# Patient Record
Sex: Female | Born: 1940 | Race: White | Hispanic: No | Marital: Married | Smoking: Never smoker
Health system: Southern US, Community
[De-identification: ages and names within clinical notes are randomized; demographics above are authoritative.]

## PROBLEM LIST (undated history)

## (undated) DIAGNOSIS — K219 Gastro-esophageal reflux disease without esophagitis: Secondary | ICD-10-CM

## (undated) DIAGNOSIS — M549 Dorsalgia, unspecified: Secondary | ICD-10-CM

## (undated) DIAGNOSIS — G629 Polyneuropathy, unspecified: Secondary | ICD-10-CM

## (undated) DIAGNOSIS — D649 Anemia, unspecified: Secondary | ICD-10-CM

## (undated) DIAGNOSIS — I10 Essential (primary) hypertension: Secondary | ICD-10-CM

## (undated) DIAGNOSIS — K602 Anal fissure, unspecified: Secondary | ICD-10-CM

## (undated) DIAGNOSIS — J329 Chronic sinusitis, unspecified: Secondary | ICD-10-CM

## (undated) DIAGNOSIS — M1990 Unspecified osteoarthritis, unspecified site: Secondary | ICD-10-CM

## (undated) DIAGNOSIS — E039 Hypothyroidism, unspecified: Secondary | ICD-10-CM

## (undated) DIAGNOSIS — T7840XA Allergy, unspecified, initial encounter: Secondary | ICD-10-CM

## (undated) DIAGNOSIS — D369 Benign neoplasm, unspecified site: Secondary | ICD-10-CM

## (undated) DIAGNOSIS — E7800 Pure hypercholesterolemia, unspecified: Secondary | ICD-10-CM

## (undated) DIAGNOSIS — C801 Malignant (primary) neoplasm, unspecified: Secondary | ICD-10-CM

## (undated) DIAGNOSIS — R42 Dizziness and giddiness: Secondary | ICD-10-CM

## (undated) DIAGNOSIS — D3610 Benign neoplasm of peripheral nerves and autonomic nervous system, unspecified: Secondary | ICD-10-CM

## (undated) DIAGNOSIS — H269 Unspecified cataract: Secondary | ICD-10-CM

## (undated) DIAGNOSIS — E079 Disorder of thyroid, unspecified: Secondary | ICD-10-CM

## (undated) HISTORY — DX: Anal fissure, unspecified: K60.2

## (undated) HISTORY — DX: Anemia, unspecified: D64.9

## (undated) HISTORY — DX: Disorder of thyroid, unspecified: E07.9

## (undated) HISTORY — DX: Gastro-esophageal reflux disease without esophagitis: K21.9

## (undated) HISTORY — PX: COLONOSCOPY: SHX174

## (undated) HISTORY — DX: Unspecified osteoarthritis, unspecified site: M19.90

## (undated) HISTORY — PX: EYE SURGERY: SHX253

## (undated) HISTORY — PX: LIPOMA EXCISION: SHX5283

## (undated) HISTORY — PX: TOTAL ABDOMINAL HYSTERECTOMY W/ BILATERAL SALPINGOOPHORECTOMY: SHX83

## (undated) HISTORY — PX: RECTOCELE REPAIR: SHX761

## (undated) HISTORY — PX: DILATION AND CURETTAGE OF UTERUS: SHX78

## (undated) HISTORY — DX: Unspecified cataract: H26.9

## (undated) HISTORY — DX: Malignant (primary) neoplasm, unspecified: C80.1

## (undated) HISTORY — DX: Allergy, unspecified, initial encounter: T78.40XA

## (undated) HISTORY — PX: HEMORROIDECTOMY: SUR656

## (undated) HISTORY — DX: Essential (primary) hypertension: I10

## (undated) HISTORY — PX: TOOTH EXTRACTION: SUR596

## (undated) HISTORY — PX: BREAST BIOPSY: SHX20

## (undated) HISTORY — PX: TUBAL LIGATION: SHX77

## (undated) HISTORY — PX: ABDOMINAL HYSTERECTOMY: SHX81

---

## 1978-05-28 HISTORY — PX: BREAST EXCISIONAL BIOPSY: SUR124

## 2005-11-26 ENCOUNTER — Encounter: Payer: Self-pay | Admitting: Family Medicine

## 2006-10-02 ENCOUNTER — Emergency Department (HOSPITAL_COMMUNITY): Admission: EM | Admit: 2006-10-02 | Discharge: 2006-10-02 | Payer: Self-pay | Admitting: Family Medicine

## 2007-01-02 ENCOUNTER — Ambulatory Visit: Payer: Self-pay | Admitting: Obstetrics & Gynecology

## 2007-01-10 ENCOUNTER — Encounter: Admission: RE | Admit: 2007-01-10 | Discharge: 2007-01-10 | Payer: Self-pay | Admitting: Obstetrics & Gynecology

## 2007-02-05 ENCOUNTER — Ambulatory Visit: Payer: Self-pay | Admitting: Family Medicine

## 2007-02-05 DIAGNOSIS — M1990 Unspecified osteoarthritis, unspecified site: Secondary | ICD-10-CM | POA: Insufficient documentation

## 2007-02-05 DIAGNOSIS — J309 Allergic rhinitis, unspecified: Secondary | ICD-10-CM | POA: Insufficient documentation

## 2007-02-05 DIAGNOSIS — D509 Iron deficiency anemia, unspecified: Secondary | ICD-10-CM | POA: Insufficient documentation

## 2007-02-05 DIAGNOSIS — M169 Osteoarthritis of hip, unspecified: Secondary | ICD-10-CM | POA: Insufficient documentation

## 2007-02-05 DIAGNOSIS — G43009 Migraine without aura, not intractable, without status migrainosus: Secondary | ICD-10-CM | POA: Insufficient documentation

## 2007-02-06 ENCOUNTER — Encounter: Payer: Self-pay | Admitting: Family Medicine

## 2007-02-11 ENCOUNTER — Encounter (INDEPENDENT_AMBULATORY_CARE_PROVIDER_SITE_OTHER): Payer: Self-pay | Admitting: *Deleted

## 2007-02-12 ENCOUNTER — Ambulatory Visit: Payer: Self-pay | Admitting: Family Medicine

## 2007-02-12 DIAGNOSIS — E669 Obesity, unspecified: Secondary | ICD-10-CM | POA: Insufficient documentation

## 2007-02-14 LAB — CONVERTED CEMR LAB
AST: 17 units/L (ref 0–37)
Albumin: 3.9 g/dL (ref 3.5–5.2)
Chloride: 111 meq/L (ref 96–112)
Cholesterol: 259 mg/dL (ref 0–200)
Direct LDL: 166 mg/dL
GFR calc non Af Amer: 89 mL/min
Sodium: 145 meq/L (ref 135–145)
TSH: 5.11 microintl units/mL (ref 0.35–5.50)
Total CHOL/HDL Ratio: 5.6
VLDL: 55 mg/dL — ABNORMAL HIGH (ref 0–40)

## 2007-02-21 ENCOUNTER — Telehealth: Payer: Self-pay | Admitting: Family Medicine

## 2007-02-21 ENCOUNTER — Ambulatory Visit: Payer: Self-pay | Admitting: Family Medicine

## 2007-02-21 DIAGNOSIS — E039 Hypothyroidism, unspecified: Secondary | ICD-10-CM | POA: Insufficient documentation

## 2007-02-21 LAB — CONVERTED CEMR LAB: T3, Free: 2.7 pg/mL (ref 2.3–4.2)

## 2007-03-05 ENCOUNTER — Ambulatory Visit: Payer: Self-pay | Admitting: Family Medicine

## 2007-03-05 DIAGNOSIS — I10 Essential (primary) hypertension: Secondary | ICD-10-CM | POA: Insufficient documentation

## 2007-03-06 ENCOUNTER — Encounter: Payer: Self-pay | Admitting: Family Medicine

## 2007-04-17 ENCOUNTER — Encounter: Admission: RE | Admit: 2007-04-17 | Discharge: 2007-04-17 | Payer: Self-pay | Admitting: Orthopedic Surgery

## 2007-05-29 ENCOUNTER — Telehealth: Payer: Self-pay | Admitting: Family Medicine

## 2007-05-29 LAB — HM COLONOSCOPY

## 2007-06-02 ENCOUNTER — Ambulatory Visit: Payer: Self-pay | Admitting: Family Medicine

## 2007-06-06 LAB — CONVERTED CEMR LAB
Cholesterol: 212 mg/dL (ref 0–200)
Total CHOL/HDL Ratio: 3.9

## 2007-08-04 ENCOUNTER — Ambulatory Visit: Payer: Self-pay | Admitting: Family Medicine

## 2007-08-08 LAB — CONVERTED CEMR LAB
T3, Free: 2.7 pg/mL (ref 2.3–4.2)
TSH: 3.32 microintl units/mL (ref 0.35–5.50)

## 2007-09-04 ENCOUNTER — Ambulatory Visit: Payer: Self-pay | Admitting: Family Medicine

## 2007-09-15 LAB — CONVERTED CEMR LAB
Cholesterol: 210 mg/dL (ref 0–200)
HDL: 51.3 mg/dL (ref >39.0)
Triglycerides: 136 mg/dL (ref 0–149)

## 2007-09-22 ENCOUNTER — Ambulatory Visit: Payer: Self-pay | Admitting: Family Medicine

## 2007-09-29 ENCOUNTER — Encounter: Payer: Self-pay | Admitting: Family Medicine

## 2007-10-15 ENCOUNTER — Telehealth: Payer: Self-pay | Admitting: Family Medicine

## 2007-10-27 ENCOUNTER — Encounter: Payer: Self-pay | Admitting: Family Medicine

## 2007-10-28 ENCOUNTER — Telehealth: Payer: Self-pay | Admitting: Family Medicine

## 2007-11-07 ENCOUNTER — Encounter: Payer: Self-pay | Admitting: Family Medicine

## 2007-12-11 ENCOUNTER — Ambulatory Visit: Payer: Self-pay | Admitting: Family Medicine

## 2007-12-12 LAB — CONVERTED CEMR LAB
ALT: 14 units/L (ref 0–35)
AST: 16 units/L (ref 0–37)
LDL Cholesterol: 116 mg/dL — ABNORMAL HIGH (ref 0–99)
Total CHOL/HDL Ratio: 3.9
VLDL: 31 mg/dL (ref 0–40)

## 2008-01-21 ENCOUNTER — Ambulatory Visit: Payer: Self-pay | Admitting: Family Medicine

## 2008-02-09 ENCOUNTER — Ambulatory Visit: Payer: Self-pay | Admitting: Family Medicine

## 2008-02-11 ENCOUNTER — Encounter: Admission: RE | Admit: 2008-02-11 | Discharge: 2008-02-11 | Payer: Self-pay | Admitting: Family Medicine

## 2008-02-12 ENCOUNTER — Encounter (INDEPENDENT_AMBULATORY_CARE_PROVIDER_SITE_OTHER): Payer: Self-pay | Admitting: *Deleted

## 2008-02-13 ENCOUNTER — Encounter: Payer: Self-pay | Admitting: Family Medicine

## 2008-02-13 ENCOUNTER — Encounter: Admission: RE | Admit: 2008-02-13 | Discharge: 2008-02-13 | Payer: Self-pay | Admitting: Family Medicine

## 2008-02-26 ENCOUNTER — Ambulatory Visit: Payer: Self-pay | Admitting: Internal Medicine

## 2008-03-09 ENCOUNTER — Ambulatory Visit: Payer: Self-pay | Admitting: Family Medicine

## 2008-03-12 ENCOUNTER — Ambulatory Visit: Payer: Self-pay | Admitting: Internal Medicine

## 2008-05-26 ENCOUNTER — Telehealth: Payer: Self-pay | Admitting: Family Medicine

## 2008-05-28 HISTORY — PX: RECTAL POLYPECTOMY: SHX2309

## 2008-05-31 ENCOUNTER — Encounter: Payer: Self-pay | Admitting: Family Medicine

## 2008-06-03 ENCOUNTER — Encounter (INDEPENDENT_AMBULATORY_CARE_PROVIDER_SITE_OTHER): Payer: Self-pay | Admitting: Surgery

## 2008-06-03 ENCOUNTER — Ambulatory Visit (HOSPITAL_COMMUNITY): Admission: RE | Admit: 2008-06-03 | Discharge: 2008-06-04 | Payer: Self-pay | Admitting: Urology

## 2008-06-18 ENCOUNTER — Ambulatory Visit: Payer: Self-pay | Admitting: Family Medicine

## 2008-06-22 ENCOUNTER — Ambulatory Visit: Payer: Self-pay | Admitting: Family Medicine

## 2008-06-22 LAB — CONVERTED CEMR LAB
AST: 16 units/L (ref 0–37)
BUN: 17 mg/dL (ref 6–23)
Basophils Absolute: 0.1 10*3/uL (ref 0.0–0.1)
Basophils Relative: 0.9 % (ref 0.0–3.0)
CO2: 27 meq/L (ref 19–32)
Calcium: 9.3 mg/dL (ref 8.4–10.5)
Cholesterol, target level: 200 mg/dL
Creatinine, Ser: 0.8 mg/dL (ref 0.4–1.2)
Eosinophils Absolute: 0.4 10*3/uL (ref 0.0–0.7)
Eosinophils Relative: 4.7 % (ref 0.0–5.0)
Free T4: 0.9 ng/dL (ref 0.6–1.6)
HDL goal, serum: 40 mg/dL
Hemoglobin: 14.3 g/dL (ref 12.0–15.0)
MCHC: 34.5 g/dL (ref 30.0–36.0)
MCV: 89.1 fL (ref 78.0–100.0)
Neutro Abs: 4.2 10*3/uL (ref 1.4–7.7)
RBC: 4.63 M/uL (ref 3.87–5.11)
T3, Free: 2.2 pg/mL — ABNORMAL LOW (ref 2.3–4.2)

## 2008-07-26 ENCOUNTER — Ambulatory Visit: Payer: Self-pay | Admitting: Family Medicine

## 2008-09-27 ENCOUNTER — Ambulatory Visit: Payer: Self-pay | Admitting: Family Medicine

## 2008-09-27 LAB — CONVERTED CEMR LAB
ALT: 14 units/L (ref 0–35)
AST: 16 units/L (ref 0–37)
Alkaline Phosphatase: 48 units/L (ref 39–117)
Bilirubin, Direct: 0.1 mg/dL (ref 0.0–0.3)
CO2: 29 meq/L (ref 19–32)
Chloride: 108 meq/L (ref 96–112)
Creatinine, Ser: 0.7 mg/dL (ref 0.4–1.2)
LDL Cholesterol: 97 mg/dL (ref 0–99)
Potassium: 3.7 meq/L (ref 3.5–5.1)
Sodium: 142 meq/L (ref 135–145)
Total Bilirubin: 1 mg/dL (ref 0.3–1.2)
Total CHOL/HDL Ratio: 3
Total Protein: 6.5 g/dL (ref 6.0–8.3)
Triglycerides: 134 mg/dL (ref 0.0–149.0)

## 2008-09-28 ENCOUNTER — Telehealth: Payer: Self-pay | Admitting: Family Medicine

## 2008-09-28 ENCOUNTER — Ambulatory Visit: Payer: Self-pay | Admitting: Family Medicine

## 2008-09-28 LAB — CONVERTED CEMR LAB
Blood in Urine, dipstick: NEGATIVE
Nitrite: NEGATIVE
Urobilinogen, UA: 0.2
pH: 6

## 2008-09-29 ENCOUNTER — Encounter: Payer: Self-pay | Admitting: Family Medicine

## 2008-11-12 ENCOUNTER — Ambulatory Visit: Payer: Self-pay | Admitting: Family Medicine

## 2008-11-12 DIAGNOSIS — G609 Hereditary and idiopathic neuropathy, unspecified: Secondary | ICD-10-CM | POA: Insufficient documentation

## 2008-11-12 LAB — CONVERTED CEMR LAB
Basophils Relative: 0.8 % (ref 0.0–3.0)
Eosinophils Relative: 2.1 % (ref 0.0–5.0)
HCT: 40.3 % (ref 36.0–46.0)
Lymphs Abs: 1.5 10*3/uL (ref 0.7–4.0)
MCV: 88.7 fL (ref 78.0–100.0)
Monocytes Absolute: 0.5 10*3/uL (ref 0.1–1.0)
Monocytes Relative: 7.9 % (ref 3.0–12.0)
Platelets: 209 10*3/uL (ref 150.0–400.0)
RBC: 4.55 M/uL (ref 3.87–5.11)
TSH: 1.58 microintl units/mL (ref 0.35–5.50)
Vitamin B-12: 298 pg/mL (ref 211–911)
WBC: 6.1 10*3/uL (ref 4.5–10.5)

## 2008-11-16 ENCOUNTER — Encounter: Payer: Self-pay | Admitting: Family Medicine

## 2008-12-27 ENCOUNTER — Ambulatory Visit: Payer: Self-pay | Admitting: Family Medicine

## 2009-01-26 ENCOUNTER — Ambulatory Visit: Payer: Self-pay | Admitting: Family Medicine

## 2009-01-26 DIAGNOSIS — K5909 Other constipation: Secondary | ICD-10-CM | POA: Insufficient documentation

## 2009-01-26 LAB — CONVERTED CEMR LAB
Glucose, Urine, Semiquant: NEGATIVE
Ketones, urine, test strip: NEGATIVE
Protein, U semiquant: 100
Specific Gravity, Urine: 1.01
pH: 7.5

## 2009-02-07 ENCOUNTER — Ambulatory Visit: Payer: Self-pay | Admitting: Family Medicine

## 2009-02-08 ENCOUNTER — Encounter (INDEPENDENT_AMBULATORY_CARE_PROVIDER_SITE_OTHER): Payer: Self-pay | Admitting: Internal Medicine

## 2009-02-14 ENCOUNTER — Encounter: Admission: RE | Admit: 2009-02-14 | Discharge: 2009-02-14 | Payer: Self-pay | Admitting: Family Medicine

## 2009-03-17 ENCOUNTER — Ambulatory Visit: Payer: Self-pay | Admitting: Family Medicine

## 2009-03-17 DIAGNOSIS — E7800 Pure hypercholesterolemia, unspecified: Secondary | ICD-10-CM | POA: Insufficient documentation

## 2009-03-18 LAB — CONVERTED CEMR LAB
Albumin: 3.9 g/dL (ref 3.5–5.2)
BUN: 17 mg/dL (ref 6–23)
CO2: 29 meq/L (ref 19–32)
Calcium: 8.9 mg/dL (ref 8.4–10.5)
Cholesterol: 184 mg/dL (ref 0–200)
Creatinine, Ser: 0.6 mg/dL (ref 0.4–1.2)
Direct LDL: 110.3 mg/dL
GFR calc non Af Amer: 105.3 mL/min (ref >60)
Glucose, Bld: 88 mg/dL (ref 70–99)
HDL: 47.9 mg/dL (ref >39.00)
Total Protein: 6.1 g/dL (ref 6.0–8.3)
Triglycerides: 226 mg/dL — ABNORMAL HIGH (ref 0.0–149.0)
VLDL: 45.2 mg/dL — ABNORMAL HIGH (ref 0.0–40.0)

## 2009-03-24 ENCOUNTER — Ambulatory Visit: Payer: Self-pay | Admitting: Family Medicine

## 2009-03-24 LAB — CONVERTED CEMR LAB
Ketones, urine, test strip: NEGATIVE
Nitrite: NEGATIVE
Protein, U semiquant: NEGATIVE
Specific Gravity, Urine: >=1.03
Urobilinogen, UA: 0.2

## 2009-03-25 ENCOUNTER — Encounter: Payer: Self-pay | Admitting: Family Medicine

## 2009-04-07 ENCOUNTER — Ambulatory Visit: Payer: Self-pay | Admitting: Family Medicine

## 2009-04-07 LAB — CONVERTED CEMR LAB
Glucose, Urine, Semiquant: NEGATIVE
Ketones, urine, test strip: NEGATIVE
Nitrite: NEGATIVE
Specific Gravity, Urine: 1.01
pH: 6

## 2009-05-26 ENCOUNTER — Ambulatory Visit: Payer: Self-pay | Admitting: Family Medicine

## 2009-05-28 HISTORY — PX: HIP SURGERY: SHX245

## 2009-07-11 ENCOUNTER — Inpatient Hospital Stay (HOSPITAL_COMMUNITY): Admission: RE | Admit: 2009-07-11 | Discharge: 2009-07-14 | Payer: Self-pay | Admitting: Orthopedic Surgery

## 2009-09-06 ENCOUNTER — Ambulatory Visit: Payer: Self-pay | Admitting: Family Medicine

## 2009-09-06 LAB — CONVERTED CEMR LAB
Alkaline Phosphatase: 61 units/L (ref 39–117)
Bilirubin, Direct: 0.1 mg/dL (ref 0.0–0.3)
CO2: 30 meq/L (ref 19–32)
Calcium: 9 mg/dL (ref 8.4–10.5)
GFR calc non Af Amer: 75.45 mL/min (ref >60)
HDL: 58 mg/dL (ref >39.00)
Sodium: 144 meq/L (ref 135–145)
Total CHOL/HDL Ratio: 3
VLDL: 29.4 mg/dL (ref 0.0–40.0)

## 2009-09-13 ENCOUNTER — Ambulatory Visit: Payer: Self-pay | Admitting: Family Medicine

## 2009-09-13 DIAGNOSIS — N895 Stricture and atresia of vagina: Secondary | ICD-10-CM | POA: Insufficient documentation

## 2009-09-29 ENCOUNTER — Encounter: Payer: Self-pay | Admitting: Family Medicine

## 2009-11-11 ENCOUNTER — Telehealth: Payer: Self-pay | Admitting: Family Medicine

## 2009-12-13 ENCOUNTER — Telehealth: Payer: Self-pay | Admitting: Family Medicine

## 2010-03-13 ENCOUNTER — Encounter: Admission: RE | Admit: 2010-03-13 | Discharge: 2010-03-13 | Payer: Self-pay | Admitting: Family Medicine

## 2010-04-03 ENCOUNTER — Ambulatory Visit: Payer: Self-pay | Admitting: Family Medicine

## 2010-04-03 ENCOUNTER — Telehealth (INDEPENDENT_AMBULATORY_CARE_PROVIDER_SITE_OTHER): Payer: Self-pay | Admitting: *Deleted

## 2010-04-03 LAB — CONVERTED CEMR LAB
Albumin: 3.7 g/dL (ref 3.5–5.2)
Alkaline Phosphatase: 51 units/L (ref 39–117)
Bilirubin, Direct: 0.1 mg/dL (ref 0.0–0.3)
Calcium: 9.1 mg/dL (ref 8.4–10.5)
GFR calc non Af Amer: 90.86 mL/min (ref >60)
HDL: 59.6 mg/dL (ref >39.00)
LDL Cholesterol: 84 mg/dL (ref 0–99)
Sodium: 142 meq/L (ref 135–145)
Total CHOL/HDL Ratio: 3
VLDL: 38.6 mg/dL (ref 0.0–40.0)

## 2010-04-07 ENCOUNTER — Ambulatory Visit: Payer: Self-pay | Admitting: Family Medicine

## 2010-06-17 ENCOUNTER — Other Ambulatory Visit: Payer: Self-pay | Admitting: Family Medicine

## 2010-06-17 DIAGNOSIS — Z780 Asymptomatic menopausal state: Secondary | ICD-10-CM

## 2010-06-28 NOTE — Progress Notes (Signed)
Summary: ABX for dental appointment  Phone Note Call from Patient Call back at Cell:  [phone]   Caller: Patient Call For: Kerby Nora MD Summary of Call: The patient had hip replacement surgery recently and has now been told that she needs ABX prior to dental procedures.  She has an appointment to see her dentist on Monday and needs a Rx. sent to the pharmacy in order to keep this appointment.  She is in hopes that she does not have to reschedule because it takes a long time to get back in for another appointment.  [facility] [place] Initial call taken by: Delilah Shan CMA Duncan Dull),  November 11, 2009 11:04 AM  Follow-up for Phone Call        patient advised rx sent in.Consuello Masse CMA   Follow-up by: Benny Lennert CMA Duncan Dull),  November 11, 2009 11:14 AM    New/Updated Medications: AMOXICILLIN 500 MG CAPS (AMOXICILLIN) 1 tab by mouth two times a day prior to dental appt. Prescriptions: AMOXICILLIN 500 MG CAPS (AMOXICILLIN) 1 tab by mouth two times a day prior to dental appt.  #20 x 0   Entered and Authorized by:   Kerby Nora MD   Signed by:   Kerby Nora MD on 11/11/2009   Method used:   Electronically to        [facility]  [place] (retail)       [address]       [place], [place]  [address]       Ph: [phone] or [phone]       Fax: [phone]   RxID:   [id number]

## 2010-06-28 NOTE — Progress Notes (Signed)
----   Converted from flag ---- ---- 03/31/2010 1:53 PM, Kerby Nora MD wrote: Dx 272.0 CMET, lipiDs, TSH Dx 244.9  ---- 03/30/2010 7:30 AM, Liane Comber CMA (AAMA) wrote: Lab orders please! Good Morning! This pt is scheduled for cpx labs Monday, which labs to draw and dx codes to use? Thanks Tasha ------------------------------

## 2010-06-28 NOTE — Progress Notes (Signed)
Summary: singulair and sulindac  Phone Note Refill Request Call back at Home Phone [phone] Call back at [phone] Message from:  Patient on December 13, 2009 10:29 AM  Refills Requested: Medication #1:  SINGULAIR 10 MG  TABS 1 by mouth daily  Medication #2:  SULINDAC 150 MG  TABS 1 by mouth two times a day Patient is asking for a written script to send to her new mail order pharmacy. Please call patient when ready.    Method Requested: Pick up at Office Initial call taken by: Melody Comas,  December 13, 2009 10:30 AM  Follow-up for Phone Call        Patient advised.Consuello Masse CMA   Follow-up by: Benny Lennert CMA Duncan Dull),  December 13, 2009 11:30 AM    Prescriptions: SULINDAC 150 MG  TABS (SULINDAC) 1 by mouth two times a day  #180 Tablet x 0   Entered and Authorized by:   Kerby Nora MD   Signed by:   Kerby Nora MD on 12/13/2009   Method used:   Print then Give to Patient   RxID:   [id number] SINGULAIR 10 MG  TABS (MONTELUKAST SODIUM) 1 by mouth daily  #90 x 3   Entered and Authorized by:   Kerby Nora MD   Signed by:   Kerby Nora MD on 12/13/2009   Method used:   Print then Give to Patient   RxID:   [id number]

## 2010-06-28 NOTE — Assessment & Plan Note (Signed)
Summary: ROA 30 MIN... CYD R/S FROM 09/13/09   Vital Signs:  Patient profile:   70 year old female Height:      66.75 inches Weight:      220 pounds BMI:     34.84 Temp:     97.9 degrees F oral Pulse rate:   64 / minute Pulse rhythm:   regular BP sitting:   122 / 62  (left arm) Cuff size:   regular  Vitals Entered By: Benny Lennert CMA Duncan Dull) (September 13, 2009 9:37 AM)  History of Present Illness: Chief complaint follow up   Decreased hearing in right ear x 10 days. No pain, no URI symptoms. allergies well controlled.  Recent left hip replacement. Using muscle relaxant and oxycodone.  Has seen urologist in past for rectocele/cystocele repair. S/P hysterectomy.Marland Kitchen  Has noted vaginal opening closing per pt, getting smaller per pt. Painful sex. Only mild  dry vaginal tissue. 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 On premarin daly.  Wants referral to podiatrist.. pain in B feet and legs. Wlaking slowly with cane given hip surgery. Recent PT after hip surgery.  Using sulindac rarely..minimal help. Has had issue with peripheral neuropathy in past... improved signigficantly with B12 supplementation.    Lipid Management History:      Positive NCEP/ATP III risk factors include female age 59 years old or older and hypertension.  Negative NCEP/ATP III risk factors include non-tobacco-user status.        Adjunctive measures started by the patient include aerobic exercise, fiber, limit alcohol consumpton, and weight reduction.  She expresses no side effects from her lipid-lowering medication.  The patient denies any symptoms to suggest myopathy or liver disease.  Comments: Improved control of triglycerides. .   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) 2)  Foot Pain, Bilateral  (ICD-729.5) 3)  Preoperative Examination  (ICD-V72.84) 4)  Microscopic Hematuria  (ICD-599.72) 5)  Pure Hypercholesterolemia  (ICD-272.0) 6)  Constipation   (ICD-564.00) 7)  Uti  (ICD-599.0) 8)  Skin Lesion  (ICD-709.9) 9)  Peripheral Neuropathy  (ICD-356.9) 10)  Dysuria  (ICD-788.1) 11)  Special Screening For Osteoporosis  (ICD-V82.81) 12)  Other Screening Mammogram  (ICD-V76.12) 13)  Urinary Urgency  (ICD-788.63) 14)  Rectal Mass  (ICD-787.99) 15)  Back Pain, Lumbar  (ICD-724.2) 16)  Essential Hypertension, Benign  (ICD-401.1) 17)  Hip Pain, Left  (ICD-719.45) 18)  Hypothyroidism  (ICD-244.9) 19)  Obesity Nos  (ICD-278.00) 20)  Family History of Cad Female 1st Degree Relative <50  (ICD-V17.3) 21)  Common Migraine  (ICD-346.10) 22)  Hyperlipidemia  (ICD-272.4) 23)  Allergic Rhinitis  (ICD-477.9) 24)  Osteoarthritis  (ICD-715.90) 25)  Anemia-iron Deficiency  (ICD-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 1 By Mouth Once Daily 2)  Singulair 10 Mg  Tabs (Montelukast Sodium) .Marland Kitchen.. 1 By Mouth Daily 3)  Allegra 180 Mg  Tabs (Fexofenadine Hcl) .... Take 1 Tablet By Mouth Once A Day 4)  Sulindac 150 Mg  Tabs (Sulindac) .Marland Kitchen.. 1 By Mouth Two Times A Day 5)  Fish Oil   Oil (Fish Oil) .... Takes 2 Capsules Two Times A Day 6)  Simvastatin 40 Mg  Tabs (Simvastatin) .... Take 1 Tablet By Mouth Once A Day 7)  Tens Unit .... Apply As Directed Dx 724.2 8)  Hydrochlorothiazide 25 Mg Tabs (Hydrochlorothiazide) .Marland Kitchen.. 1 Tab By Mouth Daily 9)  Levothyroxine Sodium 50 Mcg Tabs (Levothyroxine  Sodium) .... Take 1 Tablet By Mouth Once A Day 10)  Vitamin B-12 500 Mcg  Tabs (Cyanocobalamin) .... 1,000 Micrograms Once Daily  Allergies: 1)  ! Demerol  Past History:  Past medical, surgical, family and social histories (including risk factors) reviewed, and no changes noted (except as noted below).  Past Medical History: Reviewed history from 02/05/2007 and no changes required. Anemia-iron deficiency Osteoarthritis, neck, right shoulder, hips Allergic rhinitis Hyperlipidemia  Past Surgical  History: Reviewed history from 06/22/2008 and no changes required. hemmrhoidectomy left hip x ray moderate to severe arthritis total hysterectomy, rectocele and cystocele repair 1996 breast biopsy 1980, 2006 lipoma removal 1998 anal polyp removal, rectocele repair 05/2008  Family History: Reviewed history from 09/22/2007 and no changes required. father died 49 kidney disease Bright's disease mother died age 66 CAD, MI, high cholesterol, alcohol/valium PAD Family History of CAD Female 1st degree relative <50 no cancer  Social History: Reviewed history from 06/22/2008 and no changes required. Occupation:  [employer] Married Never Smoked Alcohol use-no Regular exercise-no, occ walk and golf diet:  healthy  Physical Exam  General:  obese appearing female in NAD Ears:  cerumen in B ears , right greater than left....TMs clear after irrigation Nose:  External nasal examination shows no deformity or inflammation. Nasal mucosa are pink and moist without lesions or exudates. Mouth:  good dentition and pharynx pink and moist.  oropharynx clear Neck:  no carotid bruit or thyromegaly no cervical or supraclavicular lymphadenopathy   Lungs:  Normal respiratory effort, chest expands symmetrically. Lungs are clear to auscultation, no crackles or wheezes. Heart:  2/6 sys murmur..chronic per pt  Abdomen:  Bowel sounds positive,abdomen soft and non-tender without masses, organomegaly or hernias noted. Genitalia:  normal introitus, no vaginal discharge, and mucosa pink and moist.   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 Already on estrogen and estrogen cream.  Orders: Gynecologic Referral (Gyn)  Problem # 2:  FOOT PAIN, BILATERAL (ICD-729.5) Likely triggered by change in  gait since hip replacement.  Using NSAID as needed. Refer to foot specialist.  Orders: Podiatry Referral (Podiatry)  Problem # 3:  PURE HYPERCHOLESTEROLEMIA (ICD-272.0)  Her updated medication list for this problem includes:    Simvastatin 40 Mg Tabs (Simvastatin) .Marland Kitchen... Take 1 tablet by mouth once a day Well controlled. Continue current medication.  Labs Reviewed: SGOT: 15 (09/06/2009)   SGPT: 15 (09/06/2009)  Lipid Goals: Chol Goal: 200 (06/22/2008)   HDL Goal: 40 (06/22/2008)   LDL Goal: 130 (06/22/2008)   TG Goal: 150 (06/22/2008)  Prior 10 Yr Risk Heart Disease: 7 % (06/22/2008)   HDL:58.00 (09/06/2009), 47.90 (03/17/2009)  LDL:65 (09/06/2009), 97 (09/27/2008)  Chol:152 (09/06/2009), 184 (03/17/2009)  Trig:147.0 (09/06/2009), 226.0 (03/17/2009)  Problem # 4:  EAR PAIN, RIGHT (ICD-388.70) Due to cerumne impaction..resolved s/p irrigation. No sign of infeciton.  The following medications were removed from the medication list:    Sulfamethoxazole-tmp Ds 800-160 Mg Tabs (Sulfamethoxazole-trimethoprim) .Marland Kitchen... 1 tab by mouth two times a day x 7 days  Problem # 5:  ESSENTIAL HYPERTENSION, BENIGN (ICD-401.1) Well controlled. Continue current medication.  Her updated medication list for this problem includes:    Hydrochlorothiazide 25 Mg Tabs (Hydrochlorothiazide) .Marland Kitchen... 1 tab by mouth daily  BP today: 122/62 Prior BP: 130/72 (  05/26/2009)  Prior 10 Yr Risk Heart Disease: 7 % (06/22/2008)  Labs Reviewed: K+: 4.0 (09/06/2009) Creat: : 0.8 (09/06/2009)   Chol: 152 (09/06/2009)   HDL: 58.00 (09/06/2009)   LDL: 65 (09/06/2009)   TG: 147.0 (09/06/2009)  Complete Medication List: 1)  Premarin 0.625 Mg Tabs (Estrogens conjugated) .Marland Kitchen.. 1 by mouth once daily 2)  Singulair 10 Mg Tabs (Montelukast sodium) .Marland Kitchen.. 1 by mouth daily 3)  Allegra 180 Mg Tabs (Fexofenadine hcl) .... Take 1 tablet by mouth once a day 4)  Sulindac 150 Mg Tabs (Sulindac) .Marland Kitchen.. 1 by mouth two times a day 5)  Fish Oil Oil  (Fish oil) .... Takes 2 capsules two times a day 6)  Simvastatin 40 Mg Tabs (Simvastatin) .... Take 1 tablet by mouth once a day 7)  Tens Unit  .... Apply as directed dx 724.2 8)  Hydrochlorothiazide 25 Mg Tabs (Hydrochlorothiazide) .Marland Kitchen.. 1 tab by mouth daily 9)  Levothyroxine Sodium 50 Mcg Tabs (Levothyroxine sodium) .... Take 1 tablet by mouth once a day 10)  Vitamin B-12 500 Mcg Tabs (Cyanocobalamin) .... 1,000 micrograms once daily  Other Orders: Prescription Created Electronically [id number])  Lipid Assessment/Plan:      Based on NCEP/ATP III, the patient's risk factor category is "0-1 risk factors".  The patient's lipid goals are as follows: Total cholesterol goal is 200; LDL cholesterol goal is 130; HDL cholesterol goal is 40; Triglyceride goal is 150.  Her LDL cholesterol goal has been met.    Patient Instructions: 1)  Referral Appointment Information 2)  Day/Date: 3)  Time: 4)  Place/MD: 5)  Address: 6)  Phone/Fax: 7)  Patient given appointment information. Information/Orders faxed/mailed.  8)  Please schedule a follow-up appointment in 6 months For CPX. LAbs prior CMET, lipids Dx 272.0. Prescriptions: LEVOTHYROXINE SODIUM 50 MCG TABS (LEVOTHYROXINE SODIUM) Take 1 tablet by mouth once a day  #90 x 3   Entered and Authorized by:   Kerby Nora MD   Signed by:   Kerby Nora MD on 09/13/2009   Method used:   [facility]        [facility]  [place] (retail)       [address]       [place], [place]  [address]       Ph: [phone] or [phone]       Fax: [phone]   RxID:   [id number]   Current Allergies (reviewed today): ! DEMEROL

## 2010-06-28 NOTE — Consult Note (Signed)
Summary: [facility]   Imported By: Lanelle Bal 10/12/2009 11:46:02  _____________________________________________________________________  External Attachment:    Type:   Image     Comment:   External Document

## 2010-06-28 NOTE — Assessment & Plan Note (Signed)
Summary: YEARLY PHYSICAL/JRR   Vital Signs:  Patient profile:   70 year old female Height:      66.75 inches Weight:      218.0 pounds BMI:     34.52 Temp:     98.1 degrees F oral Pulse rate:   64 / minute Pulse rhythm:   regular BP sitting:   120 / 70  (left arm) Cuff size:   regular  Vitals Entered By: Benny Lennert CMA Duncan Dull) (April 07, 2010 11:38 AM)  History of Present Illness: Chief complaint Chronic Illness Management   Allergies.Marland Kitchenintermittant control: Allegra and singulair.   Arthritis in hands..has noted more soreness in DIP joints. Heberden's nodules.  Bothering her more in last few months. Not taking sulindac since hip surgery.   Hypertension History:      She complains of palpitations, but denies headache, chest pain, dyspnea with exertion, orthopnea, peripheral edema, neurologic problems, syncope, and side effects from treatment.  Well controlled on current meds. .        Positive major cardiovascular risk factors include female age 3 years old or older, hyperlipidemia, and hypertension.  Negative major cardiovascular risk factors include non-tobacco-user status.    Lipid Management History:      Positive NCEP/ATP III risk factors include female age 44 years old or older and hypertension.  Negative NCEP/ATP III risk factors include non-tobacco-user status.        Her compliance with the TLC diet is fair.  The patient expresses understanding of adjunctive measures for cholesterol lowering.  Adjunctive measures started by the patient include aerobic exercise, omega-3 supplements, limit alcohol consumpton, and weight reduction.  She expresses no side effects from her lipid-lowering medication.  The patient denies any symptoms to suggest myopathy or liver disease.  Comments include: On simvastatin and fish oil.      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) 3)  Foot Pain, Bilateral   (ICD-729.5) 4)  Preoperative Examination  (ICD-V72.84) 5)  Microscopic Hematuria  (ICD-599.72) 6)  Pure Hypercholesterolemia  (ICD-272.0) 7)  Constipation  (ICD-564.00) 8)  Uti  (ICD-599.0) 9)  Skin Lesion  (ICD-709.9) 10)  Peripheral Neuropathy  (ICD-356.9) 11)  Dysuria  (ICD-788.1) 12)  Special Screening For Osteoporosis  (ICD-V82.81) 13)  Other Screening Mammogram  (ICD-V76.12) 14)  Urinary Urgency  (ICD-788.63) 15)  Rectal Mass  (ICD-787.99) 16)  Back Pain, Lumbar  (ICD-724.2) 17)  Essential Hypertension, Benign  (ICD-401.1) 18)  Hip Pain, Left  (ICD-719.45) 19)  Hypothyroidism  (ICD-244.9) 20)  Obesity Nos  (ICD-278.00) 21)  Family History of Cad Female 1st Degree Relative <50  (ICD-V17.3) 22)  Common Migraine  (ICD-346.10) 23)  Hyperlipidemia  (ICD-272.4) 24)  Allergic Rhinitis  (ICD-477.9) 25)  Osteoarthritis  (ICD-715.90) 26)  Anemia-iron Deficiency  (ICD-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 1 By Mouth Once Daily 2)  Singulair 10 Mg  Tabs (Montelukast Sodium) .Marland Kitchen.. 1 By Mouth Daily 3)  Allegra 180 Mg  Tabs (Fexofenadine Hcl) .... Take 1 Tablet By Mouth Once A Day 4)  Sulindac 150 Mg  Tabs (Sulindac) .Marland Kitchen.. 1 By Mouth Two Times A Day 5)  Fish Oil   Oil (Fish Oil) .... Takes 2 Capsules Two Times A Day 6)  Simvastatin 40 Mg  Tabs (Simvastatin) .... Take 1 Tablet By Mouth Once A Day 7)  Tens Unit .... Apply As Directed Dx 724.2  8)  Hydrochlorothiazide 25 Mg Tabs (Hydrochlorothiazide) .Marland Kitchen.. 1 Tab By Mouth Daily 9)  Levothyroxine Sodium 50 Mcg Tabs (Levothyroxine Sodium) .... Take 1 Tablet By Mouth Once A Day 10)  Vitamin B-12 500 Mcg  Tabs (Cyanocobalamin) .... 1,000 Micrograms Once Daily 11)  Amoxicillin 500 Mg Caps (Amoxicillin) .Marland Kitchen.. 1 Tab By Mouth Two Times A Day Prior To Dental Appt. 12)  Fluticasone Propionate 50 Mcg/act Susp (Fluticasone Propionate) .... 2 Sprays Per Nostril Daily  Allergies: 1)  ! Demerol  Past  History:  Past medical, surgical, family and social histories (including risk factors) reviewed, and no changes noted (except as noted below).  Past Medical History: Reviewed history from 02/05/2007 and no changes required. Anemia-iron deficiency Osteoarthritis, neck, right shoulder, hips Allergic rhinitis Hyperlipidemia  Past Surgical History: Reviewed history from 06/22/2008 and no changes required. hemmrhoidectomy left hip x ray moderate to severe arthritis total hysterectomy, rectocele and cystocele repair 1996 breast biopsy 1980, 2006 lipoma removal 1998 anal polyp removal, rectocele repair 05/2008  Family History: Reviewed history from 09/22/2007 and no changes required. father died 25 kidney disease Bright's disease mother died age 39 CAD, MI, high cholesterol, alcohol/valium PAD Family History of CAD Female 1st degree relative <50 no cancer  Social History: Reviewed history from 06/22/2008 and no changes required. Occupation:  [employer] Married Never Smoked Alcohol use-no Regular exercise-no, occ walk and golf diet:  healthy  Review of Systems General:  Denies fatigue and fever. CV:  Denies chest pain or discomfort. Resp:  Denies shortness of breath. GI:  Denies abdominal pain. GU:  Denies dysuria.  Physical Exam  General:  obese appearing female in NAD Head:  no maxillary sinus ttp Eyes:  No corneal or conjunctival inflammation noted. EOMI. Perrla. Funduscopic exam benign, without hemorrhages, exudates or papilledema. Vision grossly normal. Ears:  cerumen in B ears , right greater than left....TMs clear after irrigation Nose:  nasal dischargemucosal pallor.   Mouth:  good dentition and pharynx pink and moist.  oropharynx clear Neck:  no carotid bruit or thyromegaly no cervical or supraclavicular lymphadenopathy  Chest Wall:  No deformities, masses, or tenderness noted. Breasts:  No mass, nodules, thickening, tenderness, bulging, retraction, inflamation,  nipple discharge or skin changes noted.   Lungs:  Normal respiratory effort, chest expands symmetrically. Lungs are clear to auscultation, no crackles or wheezes. Heart:  Normal rate and regular rhythm. S1 and S2 normal without gallop, murmur, click, rub or other extra sounds. Abdomen:  Bowel sounds positive,abdomen soft and non-tender without masses, organomegaly or hernias noted. Genitalia:  not indicated  Msk:  Joint deformity in DIP and PIP joints of B hands.  Pulses:  R and L posterior tibial pulses are full and equal bilaterally  Extremities:  no edema Psych:  Cognition and judgment appear intact. Alert and cooperative with normal attention span and concentration. 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 Consider increasing fish oil to 3000 mg divided daily. Her updated medication list for this problem includes:    Simvastatin 40 Mg Tabs (Simvastatin) .Marland Kitchen... Take 1 tablet by mouth once a day  Labs Reviewed: SGOT: 17 (04/03/2010)   SGPT: 14 (04/03/2010)  Lipid Goals: Chol Goal: 200 (06/22/2008)   HDL Goal: 40 (06/22/2008)   LDL Goal: 130 (06/22/2008)   TG Goal: 150 (06/22/2008)  Prior 10 Yr Risk Heart Disease: 7 % (06/22/2008)   HDL:59.60 (04/03/2010), 58.00 (09/06/2009)  LDL:84 (04/03/2010), 65 (  09/06/2009)  Chol:182 (04/03/2010), 152 (09/06/2009)  Trig:193.0 (04/03/2010), 147.0 (09/06/2009)  Problem # 2:  ESSENTIAL HYPERTENSION, BENIGN (ICD-401.1) Well controlled. Continue current medication.  Her updated medication list for this problem includes:    Hydrochlorothiazide 25 Mg Tabs (Hydrochlorothiazide) .Marland Kitchen... 1 tab by mouth daily  BP today: 120/70 Prior BP: 122/62 (09/13/2009)  Prior 10 Yr Risk Heart Disease: 7 % (06/22/2008)  Labs Reviewed: K+: 3.8 (04/03/2010) Creat: : 0.7 (04/03/2010)   Chol: 182 (04/03/2010)   HDL: 59.60 (04/03/2010)   LDL:  84 (04/03/2010)   TG: 193.0 (04/03/2010)  Problem # 3:  HYPOTHYROIDISM (ICD-244.9) Well controlled. Continue current medication.  Her updated medication list for this problem includes:    Levothyroxine Sodium 50 Mcg Tabs (Levothyroxine sodium) .Marland Kitchen... Take 1 tablet by mouth once a day TSH: 3.12 (04/03/2010 8:21:29 AM)   Problem # 4:  ALLERGIC RHINITIS (ICD-477.9) Add nasal steroid spray. Continue antihistamine. Her updated medication list for this problem includes:    Allegra 180 Mg Tabs (Fexofenadine hcl) .Marland Kitchen... Take 1 tablet by mouth once a day    Fluticasone Propionate 50 Mcg/act Susp (Fluticasone propionate) .Marland Kitchen... 2 sprays per nostril daily  Problem # 5:  OSTEOARTHRITIS (ICD-715.90) Use sulindac only as needed.  Her updated medication list for this problem includes:    Sulindac 150 Mg Tabs (Sulindac) .Marland Kitchen... 1 by mouth two times a day  Problem # 6:  Preventive Health Care (ICD-V70.0) Assessment: Comment Only The patient's preventative maintenance and recommended screening tests for an annual wellness exam were reviewed in full today. Brought up to date unless services declined.  Counselled on the importance of diet, exercise, and its role in overall health and mortality. The patient's FH and SH was reviewed, including their home life, tobacco status, and drug and alcohol status.     Complete Medication List: 1)  Premarin 0.625 Mg Tabs (Estrogens conjugated) .Marland Kitchen.. 1 by mouth once daily 2)  Singulair 10 Mg Tabs (Montelukast sodium) .Marland Kitchen.. 1 by mouth daily 3)  Allegra 180 Mg Tabs (Fexofenadine hcl) .... Take 1 tablet by mouth once a day 4)  Sulindac 150 Mg Tabs (Sulindac) .Marland Kitchen.. 1 by mouth two times a day 5)  Fish Oil Oil (Fish oil) .... Takes 2 capsules two times a day 6)  Simvastatin 40 Mg Tabs (Simvastatin) .... Take 1 tablet by mouth once a day 7)  Tens Unit  .... Apply as directed dx 724.2 8)  Hydrochlorothiazide 25 Mg Tabs (Hydrochlorothiazide) .Marland Kitchen.. 1 tab by mouth daily 9)   Levothyroxine Sodium 50 Mcg Tabs (Levothyroxine sodium) .... Take 1 tablet by mouth once a day 10)  Vitamin B-12 500 Mcg Tabs (Cyanocobalamin) .... 1,000 micrograms once daily 11)  Amoxicillin 500 Mg Caps (Amoxicillin) .Marland Kitchen.. 1 tab by mouth two times a day prior to dental appt. 12)  Fluticasone Propionate 50 Mcg/act Susp (Fluticasone propionate) .... 2 sprays per nostril daily  Other Orders: Tdap => 42yrs IM ([address]) Admin 1st Vaccine ([address]) Radiology Referral (Radiology)  Hypertension Assessment/Plan:      The patient's hypertensive risk group is category B: At least one risk factor (excluding diabetes) with no target organ damage.  Her calculated 10 year risk of coronary heart disease is 7 %.  Today's blood pressure is 120/70.  Her blood pressure goal is < 140/90.  Lipid Assessment/Plan:      Based on NCEP/ATP III, the patient's risk factor category is "0-1 risk factors".  The patient's lipid goals are as follows: Total cholesterol goal is 200; LDL  cholesterol goal is 130; HDL cholesterol goal is 40; Triglyceride goal is 150.  Her LDL cholesterol goal has been met.    Patient Instructions: 1)  Work on regular exercisie routine. 2)  Consider increasing fish oil to 3000 mg divided daily. 3)   Look into shingles vaccine coverage. 4)   Start nasal steroid..fluticasone for allergies. 5)  Referral Appointment Information 6)  Day/Date: 7)  Time: 8)  Place/MD: 9)  Address: 10)  Phone/Fax: 11)  Patient given appointment information. Information/Orders faxed/mailed.  12)  Use sulindac as needed for hand pain.  Prescriptions: SINGULAIR 10 MG  TABS (MONTELUKAST SODIUM) 1 by mouth daily  #90 x 3   Entered and Authorized by:   Kerby Nora MD   Signed by:   Kerby Nora MD on 04/07/2010   Method used:   Print then Give to Patient   RxID:   [id number] SULINDAC 150 MG  TABS (SULINDAC) 1 by mouth two times a day  #180 Tablet x 0   Entered and Authorized by:   Kerby Nora MD   Signed by:   Kerby Nora MD on 04/07/2010   Method used:   Electron[facility] to        [facility] [id number] (retail[facility]       [address]       [place], [place]  [address]       Ph: [phone] or [phone]       Fax: [phone]   RxID:   [id number] LEVOTHYROXINE SODIUM 50 MCG TABS (LEVOTHYROXINE SODIUM) Take 1 tablet by mouth once a day  #90 x 3   Entered and Authorized by:   Kerby Nora MD   Signed by:   Kerby Nora MD on 04/07/2010   Method used:   [facility]        [facility] [facility] (retail[facility]       [address]       [place], [place]  [address]       Ph: [phone] or [phone]       Fax: [phone]   RxID:   [id number] HYDROCHLOROTHIAZIDE 25 MG TABS (HYDROCHLOROTHIAZIDE) 1 tab by mouth daily  #90 Tablet x 3   Entered and Authorized by:   Kerby Nora MD   Signed by:   Kerby Nora MD on 04/07/2010   Method used:   [facility]        [facility] [id number]       [address]       [place], [place]  [address]       Ph: [phone] or [phone]       Fax: [phone]   RxID:   [id number] SIMVASTATIN 40 MG  TABS (SIMVASTATIN) Take 1 tablet by mouth once a day  #90 Tablet x 3   Entered and Authorized by:   Kerby Nora MD   Signed by:   Kerby Nora MD on 04/07/2010   Method used:   [facility]        [facility] (retail[facility]       [address]       [place], [place]  [address]       Ph: [phone] or [phone]       Fax: [phone]   RxID:   [id number] ALLEGRA 180 MG  TABS (FEXOFENADINE HCL) Take 1 tablet by mouth once a day  #90 x 3   Entered and Authorized by:   Kerby Nora MD  Signed by:   Kerby Nora MD on 04/07/2010   Method used:   [facility]        [facility] [id number]       [address]       [place], [place]  [address]       Ph: [phone] or [phone]       Fax: [phone]   RxID:   [id number] PREMARIN 0.625 MG  TABS (ESTROGENS CONJUGATED)  1 by mouth once daily  #90 x 3   Entered and Authorized by:   Kerby Nora MD   Signed by:   Kerby Nora MD on 04/07/2010   Method used:   [facility]        [facility] [facility]       [address]       [place], [place]  [address]       Ph: [phone] or [phone]       Fax: [phone]   RxID:   [id number] FLUTICASONE PROPIONATE 50 MCG/ACT SUSP (FLUTICASONE PROPIONATE) 2 sprays per nostril daily  #1 x 0   Entered and Authorized by:   Kerby Nora MD   Signed by:   Kerby Nora MD on 04/07/2010   Method used:   [facility]        [facility] [facility]       [address]       [place], [place]  [address]       Ph: [phone] or [phone]       Fax: [phone]   RxID:   [id number]    Orders Added: 1)  Tdap => 38yrs IM [[address]] 2)  Admin 1st Vaccine [90471] 3)  Radiology Referral [Radiology] 4)  Est. Patient Level IV [01751]   Immunizations Administered:  Tetanus Vaccine:    Vaccine Type: Tdap    Site: left deltoid    Mfr: GlaxoSmithKline    Dose: 0.5 ml    Route: IM    Given by: Benny Lennert CMA (AAMA)    Exp. Date: 03/16/2012    Lot #: [id number]    VIS given: 04/14/08 version given April 07, 2010.   Immunizations Administered:  Tetanus Vaccine:    Vaccine Type: Tdap    Site: left deltoid    Mfr: GlaxoSmithKline    Dose: 0.5 ml    Route: IM    Given by: Benny Lennert CMA (AAMA)    Exp. Date: 03/16/2012    Lot #: [id number]    VIS given: 04/14/08 version given April 07, 2010.  Current Allergies (reviewed today): ! DEMEROL   Past Medical History:    Reviewed history from 02/05/2007 and no changes required:       Anemia-iron deficiency       Osteoarthritis, neck, right shoulder, hips       Allergic rhinitis       Hyperlipidemia  Past Surgical History:    Reviewed history from 06/22/2008 and no changes required:       hemmrhoidectomy       left hip x ray moderate to severe  arthritis       total hysterectomy, rectocele and cystocele repair 1996       breast biopsy 1980, 2006       lipoma removal 1998       anal polyp removal, rectocele repair 05/2008   Flu Vaccine Next Due:  Refused

## 2010-07-07 ENCOUNTER — Ambulatory Visit
Admission: RE | Admit: 2010-07-07 | Discharge: 2010-07-07 | Disposition: A | Discharge status: Home / Self Care | Payer: Federal, State, Local not specified - PPO | Source: Ambulatory Visit | Attending: Family Medicine | Admitting: Family Medicine

## 2010-07-07 ENCOUNTER — Encounter: Payer: Self-pay | Admitting: Family Medicine

## 2010-07-07 DIAGNOSIS — Z780 Asymptomatic menopausal state: Secondary | ICD-10-CM

## 2010-07-11 ENCOUNTER — Encounter (INDEPENDENT_AMBULATORY_CARE_PROVIDER_SITE_OTHER): Payer: Self-pay | Admitting: *Deleted

## 2010-07-19 NOTE — Letter (Signed)
Summary: Results Follow up Letter  [place] [facility]  [address] [place], [place] [address]   Phone: [phone]  Fax: [phone]    07/11/2010 MRN: [id number]     [facility] [address] [place], [place]  [address]     Dear Kristine [name],  The following are the results of your recent test(s):  Test         Result    Pap Smear:        Normal _____  Not Normal _____ Comments: ______________________________________________________ Cholesterol: LDL(Bad cholesterol):         Your goal is less than:         HDL (Good cholesterol):       Your goal is more than: Comments:  ______________________________________________________ Mammogram:        Normal _____  Not Normal _____ Comments:  ___________________________________________________________________ Hemoccult:        Normal _____  Not normal _______ Comments:    _____________________________________________________________________ Other Tests:BONE DENSITY: NORMAL    We routinely do not discuss normal results over the telephone.  If you desire a copy of the results, or you have any questions about this information we can discuss them at your next office visit.   Sincerely,  Kerby Nora MD

## 2010-07-19 NOTE — Letter (Signed)
Summary: Dr Chilton Si   Dr Chilton Si   Imported By: Kassie Mends 07/14/2010 09:40:04  _____________________________________________________________________  External Attachment:    Type:   Image     Comment:   External Document

## 2010-07-21 ENCOUNTER — Ambulatory Visit: Payer: Self-pay | Admitting: Ophthalmology

## 2010-08-16 LAB — CBC
HCT: 27.6 % — ABNORMAL LOW (ref 36.0–46.0)
HCT: 28.6 % — ABNORMAL LOW (ref 36.0–46.0)
HCT: 36.3 % (ref 36.0–46.0)
Hemoglobin: 10.1 g/dL — ABNORMAL LOW (ref 12.0–15.0)
Hemoglobin: 12.4 g/dL (ref 12.0–15.0)
MCHC: 35.6 g/dL (ref 30.0–36.0)
MCV: 90.8 fL (ref 78.0–100.0)
Platelets: 159 10*3/uL (ref 150–400)
Platelets: 163 10*3/uL (ref 150–400)
Platelets: 169 10*3/uL (ref 150–400)
RBC: 3.17 MIL/uL — ABNORMAL LOW (ref 3.87–5.11)
RBC: 4.03 MIL/uL (ref 3.87–5.11)
RDW: 13.6 % (ref 11.5–15.5)
RDW: 13.9 % (ref 11.5–15.5)
RDW: 13.9 % (ref 11.5–15.5)
RDW: 13.9 % (ref 11.5–15.5)
WBC: 5.6 10*3/uL (ref 4.0–10.5)
WBC: 6.4 10*3/uL (ref 4.0–10.5)
WBC: 6.9 10*3/uL (ref 4.0–10.5)

## 2010-08-16 LAB — TYPE AND SCREEN
ABO/RH(D): A NEG
DAT, IgG: NEGATIVE
Donor AG Type: NEGATIVE
Donor AG Type: NEGATIVE

## 2010-08-16 LAB — PROTIME-INR
INR: 1.35 (ref 0.00–1.49)
Prothrombin Time: 12.8 seconds (ref 11.6–15.2)
Prothrombin Time: 14.8 seconds (ref 11.6–15.2)
Prothrombin Time: 17.4 seconds — ABNORMAL HIGH (ref 11.6–15.2)

## 2010-08-16 LAB — BASIC METABOLIC PANEL
BUN: 7 mg/dL (ref 6–23)
CO2: 26 mEq/L (ref 19–32)
Calcium: 7.9 mg/dL — ABNORMAL LOW (ref 8.4–10.5)
Calcium: 8 mg/dL — ABNORMAL LOW (ref 8.4–10.5)
GFR calc Af Amer: >60 mL/min (ref >60)
GFR calc non Af Amer: >60 mL/min (ref >60)
GFR calc non Af Amer: >60 mL/min (ref >60)
Glucose, Bld: 110 mg/dL — ABNORMAL HIGH (ref 70–99)
Potassium: 3.7 mEq/L (ref 3.5–5.1)
Sodium: 139 mEq/L (ref 135–145)

## 2010-08-16 LAB — APTT: aPTT: 26 seconds (ref 24–37)

## 2010-08-16 LAB — COMPREHENSIVE METABOLIC PANEL
ALT: 14 U/L (ref 0–35)
Alkaline Phosphatase: 53 U/L (ref 39–117)
BUN: 19 mg/dL (ref 6–23)
CO2: 25 mEq/L (ref 19–32)
Chloride: 105 mEq/L (ref 96–112)
Glucose, Bld: 89 mg/dL (ref 70–99)
Potassium: 3.7 mEq/L (ref 3.5–5.1)
Sodium: 139 mEq/L (ref 135–145)
Total Bilirubin: 0.5 mg/dL (ref 0.3–1.2)
Total Protein: 6.3 g/dL (ref 6.0–8.3)

## 2010-08-16 LAB — URINALYSIS, ROUTINE W REFLEX MICROSCOPIC
Glucose, UA: NEGATIVE mg/dL
Ketones, ur: NEGATIVE mg/dL
Nitrite: NEGATIVE
Specific Gravity, Urine: 1.018 (ref 1.005–1.030)
pH: 5.5 (ref 5.0–8.0)

## 2010-09-11 LAB — CBC
Hemoglobin: 13.2 g/dL (ref 12.0–15.0)
MCHC: 34 g/dL (ref 30.0–36.0)
MCHC: 34.2 g/dL (ref 30.0–36.0)
MCV: 88.7 fL (ref 78.0–100.0)
MCV: 89.4 fL (ref 78.0–100.0)
Platelets: 178 10*3/uL (ref 150–400)
Platelets: 200 10*3/uL (ref 150–400)
RBC: 3.98 MIL/uL (ref 3.87–5.11)
RDW: 13.1 % (ref 11.5–15.5)
RDW: 13.2 % (ref 11.5–15.5)
WBC: 7.4 10*3/uL (ref 4.0–10.5)

## 2010-09-11 LAB — BASIC METABOLIC PANEL
Calcium: 9 mg/dL (ref 8.4–10.5)
Creatinine, Ser: 0.74 mg/dL (ref 0.4–1.2)
GFR calc non Af Amer: >60 mL/min (ref >60)
Glucose, Bld: 99 mg/dL (ref 70–99)
Sodium: 140 mEq/L (ref 135–145)

## 2010-09-11 LAB — PROTIME-INR
INR: 1 (ref 0.00–1.49)
Prothrombin Time: 13.2 seconds (ref 11.6–15.2)

## 2010-09-11 LAB — TYPE AND SCREEN
Donor AG Type: NEGATIVE
PT AG Type: NEGATIVE

## 2010-09-11 LAB — APTT: aPTT: 24 seconds (ref 24–37)

## 2010-09-25 ENCOUNTER — Other Ambulatory Visit: Payer: Self-pay | Admitting: Family Medicine

## 2010-09-29 ENCOUNTER — Other Ambulatory Visit: Payer: Self-pay | Admitting: Family Medicine

## 2010-09-29 DIAGNOSIS — E7800 Pure hypercholesterolemia, unspecified: Secondary | ICD-10-CM

## 2010-10-02 ENCOUNTER — Other Ambulatory Visit (INDEPENDENT_AMBULATORY_CARE_PROVIDER_SITE_OTHER): Payer: Federal, State, Local not specified - PPO | Admitting: Family Medicine

## 2010-10-02 DIAGNOSIS — E7800 Pure hypercholesterolemia, unspecified: Secondary | ICD-10-CM

## 2010-10-02 LAB — COMPREHENSIVE METABOLIC PANEL
AST: 16 U/L (ref 0–37)
Albumin: 3.7 g/dL (ref 3.5–5.2)
Alkaline Phosphatase: 48 U/L (ref 39–117)
BUN: 20 mg/dL (ref 6–23)
Calcium: 8.6 mg/dL (ref 8.4–10.5)
Chloride: 106 mEq/L (ref 96–112)
Creatinine, Ser: 0.6 mg/dL (ref 0.4–1.2)
Glucose, Bld: 93 mg/dL (ref 70–99)
Potassium: 3.9 mEq/L (ref 3.5–5.1)

## 2010-10-02 LAB — LIPID PANEL
Cholesterol: 176 mg/dL (ref 0–200)
LDL Cholesterol: 85 mg/dL (ref 0–99)
Triglycerides: 176 mg/dL — ABNORMAL HIGH (ref 0.0–149.0)
VLDL: 35.2 mg/dL (ref 0.0–40.0)

## 2010-10-10 NOTE — Op Note (Signed)
Kristine [name], Kristine [name]                ACCOUNT NO.:  [id number]   MEDICAL RECORD NO.:  [id number]          PATIENT TYPE:  AMB   LOCATION:  DAY                          FACILITY:  [facility]   PHYSICIAN:  Martina Sinner, MD DATE OF BIRTH:  10-Feb-1941   DATE OF PROCEDURE:  06/03/2008  DATE OF DISCHARGE:                               OPERATIVE REPORT   SURGEON:  Martina Sinner, MD   ASSISTANT:  Dr. Duane Boston.   PREOPERATIVE DIAGNOSIS:  Mild vault prolapse with rectocele.   POSTOPERATIVE DIAGNOSIS:  Mild vault prolapse with rectocele.   SURGERY:  Vault suspension (iliococcygeus repair) plus rectocele repair  plus graft plus perineal repair.   Ms. 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  Dr. Marcille Blanco removed a small polyp.  She had an  excellent bowel prep prior to surgery.  Preoperative lab tests were  normal.  Antibiotics were given.  Leg position was excellent to minimize  risks of compartment syndrome, neuropathy and DVT.   We prepped and draped after Dr. Susy Frizzle Tsuei's case, which has been  dictated.   After prepping the patient, there was an obvious narrowing at the  introitus due to skin tethering at 6 o'clock.  This was noted prior to  surgery.  In a planned fashion, I made a midline incision approximately  1.5 cm in depth opening up the introitus giving me good exposure.  She  had a long vaginal vault.  She did have some apical weakness and one  could argue might have a very small enterocele at the top of her vagina  but I do not think she does based upon the clinical examination and  intraoperative findings.  She was narrow at the apex which was noted  prior to surgery.  She had a very long vagina.   I made a long midline incision to approximately 1.5 cm from the apex.  I  could see the dimples.  She had very thin posterior vaginal wall mucosa  and I sharply dissected it from the rectovaginal fascia, taking a lot of  care  not to injure the rectum.  She had very elastic tissue.  She had a  lot of bulging and mobility of the bowel in the midline.  I dissected to  the pelvic sidewall bilaterally.  I did a rectal examination and in my  opinion she did not have an enterocele but really had a lot of  elasticity and mobility of her central defect.  She had a large apical  defect as well as a defect near the introitus.  A typical midline  posterior repair would not have been helpful.  I was very careful to  take down the sidewalls bilaterally to identify the iliococcygeus muscle  at the apex.  I placed a 0 Vicryl through the iliococcygeus muscle with  CT2 needle bilaterally.  I placed 2 more near the introitus picking up a  little bit of rectovaginal fascia.  A rectal examination was repeated,  there was no distortion of the rectum or injury to the  rectum or suture  in the rectum.   I did a trapdoor closure of the apical defect utilizing her reasonably  thick rectovaginal fascia.  This flattened the defect nicely.  I was  very diligent with 3-0 Vicryl x3 at the apex not to pick up bowel and to  get close to the apex with a site defect repair.   I then sewed in the 7 x 4 dermal graft leaving a trapdoor to flip up  over the apex which was sutured in the midline to the apex.   I also used 2 more sutures of 2-0 Vicryl halfway along the way to the  graft to suture it to the pelvic sidewall, not leaving a ring effect.  I  then trimmed a few millimeters of mucosa on her right but none on her  left.  I was happy with the long posterior closure.  The 7 x 4 cm dermal  graft was trimmed minimally near the introitus.  It was anchored with 2-  0 Vicryl sutures to the perineal body.  The running suture intravaginal  was brought out into the introitus and I closed the perineum with  subcuticular 2-0 Vicryl.   Vaginal length was excellent.  I was very happy with closure.  There is  no question she is a little bit narrow near  the apex which she was prior  to surgery.  She has a little bit of a ski jump at 6 o'clock at the  introitus, it was much better than it was prior to surgery.   Total blood loss was less than 100 mL.  Vaginal pack with Estrace cream  was inserted.  Leg position was good.  Urine was clear.  I was very  pleased with the operation.  Hopefully will reach her goal.   I will need to be diligent postoperatively keeping her bowels very soft  for the next 6 weeks or longer.           ______________________________  Martina Sinner, MD  Electronically Signed     SAM/MEDQ  D:  06/03/2008  T:  06/03/2008  Job:  [id number]

## 2010-10-10 NOTE — Op Note (Signed)
[name], [name]                ACCOUNT NO.:  [id number]   MEDICAL RECORD NO.:  [id number]          PATIENT TYPE:  AMB   LOCATION:  DAY                          FACILITY:  [facility]   PHYSICIAN:  Wilmon Arms. Corliss Skains, M.D. DATE OF BIRTH:  [birth year]   DATE OF PROCEDURE:  06/03/2008  DATE OF DISCHARGE:                               OPERATIVE REPORT   PREOPERATIVE DIAGNOSIS:  Anterior anal polyp.   POSTOPERATIVE DIAGNOSIS:  Anterior anal polyp.   PROCEDURE:  Excision of anterior anal polyp.   SURGEON:  Dr. Corliss Skains.   ANESTHESIA:  General.   INDICATIONS:  The patient is a 70 year old female with a long history of  a rectocele, as well as difficulty with bowel movements.  She had a  previous hemorrhoidectomy which was a fairly traumatic experience for  the patient.  She has continued difficulty with bowel movements.  She is  having a rectocele repair with Dr. Perley Jain.  The patient was also  noted to have a firm anterior anal polyp.  She was sent to me to discuss  excision for biopsy.  We discussed all of the loose external  hemorrhoidal tissue that is around the anus.  It seems that she has a  near circumferential slight mucosal prolapse.  At this time, the  decision was made to just excise the anal polyp and leave all of this  loose tissue alone.  We will wait to see how she does with her bowel  movements after her rectocele has been fully repaired.   DESCRIPTION OF PROCEDURE:  The patient was brought to the operating room  and placed in the supine position on the operating room table.  After an  adequate level of general anesthesia was obtained, her legs were placed  in [place] stirrups in lithotomy position.  Her perineum was prepped  with Betadine and draped in a sterile fashion.  A timeout was taken to  assure the proper patient and proper procedure.  Her anus was dilated up  to three fingers.  She had a lot of loose tissue around the anus with  slight mucosal prolapse.  In the  anterior midline, there is a 2-cm long,  firm, narrow-based polyp.  This was grasped with an Allis clamp.  The  base was amputated with the cautery.  The mucosal edges were then  reapproximated with a 3-0 Vicryl.  Hemostasis was good.  No other masses  were noted.  We were able to manually reduce some of the mucosal  prolapse, but she still has a lot of loose external skin.  Based on our  preoperative discussion, the decision was made to leave this alone for  now.  The patient was then turned over to Dr. McDiarmid for his portion  of the case.      Wilmon Arms. Tsuei, M.D.  Electronically Signed     MKT/MEDQ  D:  06/03/2008  T:  06/03/2008  Job:  [id number]

## 2010-10-10 NOTE — Assessment & Plan Note (Signed)
Kristine [name], Kristine [name]                ACCOUNT NO.:  [id number]   MEDICAL RECORD NO.:  [id number]          PATIENT TYPE:  POB   LOCATION:  [facility] at [facility]         FACILITY:  [facility]   PHYSICIAN:  Elsie Lincoln, MD      DATE OF BIRTH:  1940/06/06   DATE OF SERVICE:  01/02/2007                                  CLINIC NOTE   The patient is a 70 year old female G3, P3, who has been menopausal  since 1996 when she had a TAH/BSO.  She had the hysterectomy for a large  ovarian cyst.  She denies any other reason for the hysterectomy  including abnormal Pap smears.  She comes to me initially for complaints  in her breast.  She has had removal of lipomas in her breast and  actually all over her body.  She had a needle biopsy done of the right  breast lump several years ago and it came back benign.  She is now  feeling one at around 11 o'clock on the left breast which has never been  sampled.  I believe that she needs to be seen at the [facility] where  she can have these things evaluated in depth, and if she needs and  excisional procedure will send her to General Surgery.  The patient also  complains of a recurrence of her rectocele that she had repaired in 1996  as well when she had the repair of her cystocele and hysterectomy.  The  patient feels that she becomes constipated and has an urge to void, he  rectum fills up and she splints in order to evacuate her bowels and then  behind the mass of stool in her rectum there is soft stool, so she does  not believe she has a chronic constipation problem.  She also complains  of dribbling enough to need a pantiliner throughout the day.  Sometimes  she feels it and sometimes she does not.  She also complains of urge  incontinence.  She has been on Detrol in the past however she felt the  side effects were worse than the actual urge incontinence.  I explained  to her there was a new drug called Vesicare but she would rather wait  until urodynamics  are complete before she goes on any medications.   PAST MEDICAL HISTORY:  1. Multiple lipomas.  2. Breast biopsies.  3. Asthma.  4. Seasonal allergies.   PAST SURGICAL HISTORY:  1. Hemorrhoidectomy.  2. TAH/BSO.  3. Lipoma removal from left thigh.  4. Breast biopsy of the left breast.  5. She had D&C and tubal ligation prior to the hysterectomy.   MEDICATIONS:  Premarin, Singulair, Sulindac, Allegra.   ALLERGIES:  None.   FAMILY HISTORY:  Brother has type 2 diabetes.  Mother had heart disease  and heart attack.  Sister has multiple lipoma.   SOCIAL HISTORY:  Lives with her husband.  Is not sexually active.  She  does work outside the home.  She drinks 1 caffeinated beverage per day.   REVIEW OF SYSTEMS:  Positive for swelling of legs, muscle aches,  problems with nose and smell, problems with loss of urine.  PHYSICAL EXAMINATION:  BREASTS:  Diffusely tender during the breast  exam.  I do feel the lump on the right breast that was biopsied, needle  biopsy x2.  It is approximately a 3 cm x 1 cm area and I believe this  needs to be reevaluated.  It is tender.  The left breast around 11  o'clock to 12 o'clock there is a very small deeper area that needs to be  addressed as well.  No lymphadenopathy.  No nipple discharge.  ABDOMEN:  Soft, nontender, nondistended.  GENITALIA:  Tanner 5.  Patient has a dry vagina and speculum exam is  painful even with using lubrication.  The patient has minimal vault  prolapse however she has an impressive rectocele and a very mild  cystocele.  She has hemorrhoids present and there is constipated stool  in the vault.   ASSESSMENT/PLAN:  A 70 year old female with breast issues as outlined  above and also recurrence of rectocele, urge incontinence and  constipation.  1. [facility] to evaluate both breasts in depth.  2. Referral to Dr. Awilda Bill for urodynamics and evaluation to re-      do the rectocele, possibly needing mesh.  3.  Return to clinic in 4 weeks.           ______________________________  Elsie Lincoln, MD     KL/MEDQ  D:  01/02/2007  T:  01/02/2007  Job:  [id number]

## 2010-12-19 ENCOUNTER — Encounter (INDEPENDENT_AMBULATORY_CARE_PROVIDER_SITE_OTHER): Payer: Self-pay | Admitting: Surgery

## 2010-12-21 ENCOUNTER — Encounter (INDEPENDENT_AMBULATORY_CARE_PROVIDER_SITE_OTHER): Payer: Self-pay | Admitting: Surgery

## 2010-12-21 ENCOUNTER — Encounter (INDEPENDENT_AMBULATORY_CARE_PROVIDER_SITE_OTHER): Payer: Self-pay

## 2010-12-21 ENCOUNTER — Telehealth (INDEPENDENT_AMBULATORY_CARE_PROVIDER_SITE_OTHER): Payer: Self-pay | Admitting: General Surgery

## 2010-12-21 ENCOUNTER — Ambulatory Visit (INDEPENDENT_AMBULATORY_CARE_PROVIDER_SITE_OTHER): Payer: Federal, State, Local not specified - PPO | Admitting: Surgery

## 2010-12-21 DIAGNOSIS — K602 Anal fissure, unspecified: Secondary | ICD-10-CM

## 2010-12-21 MED ORDER — AMBULATORY NON FORMULARY MEDICATION: Status: DC

## 2010-12-21 NOTE — Telephone Encounter (Signed)
Patient saw Dr Corliss Skains for evaluation of hemorrhoids this morning. Patient was given rx for diltiazem. Patient was calling back to inquire about getting a prescription for pain medicine. Dr Corliss Skains has left for the day, I spoke with Huntley Dec, who worked with him today. She advised the patient did have some other things going on like a rectocele. I advised patient that diltiazem should help relax the sphincter, which should help with the pain and that narcotics could constipate her and make the problem worse. I advised if she thought she needed narcotics she should contact her medical doctor.

## 2010-12-21 NOTE — Progress Notes (Signed)
Kristine [name] is a 70 y.o. female.    Chief Complaint  Patient presents with  . Other    evaluate hemorrhoids    HPI HPI This patient is a former patient of mine status post excision of anterior anal polyp June 03, 2008. This was done in conjunction with her rectocele repair. The patient continues to have a lot of loose external skin. However her main complaint is that she has chronic constipation with pain associated with her bowel movements as well as some bleeding. She states that her rectocele has recurred. She has not returned to see her urologic surgeon due to personality issues. She also has not seen a GI doctor regarding her chronic constipation. She uses MiraLax on a regular basis. The bleeding does not occur every day. The pain is occasionally very severe and radiates out into her buttocks.  Past Medical History  Diagnosis Date  . Asthma   . Allergy   . Hypertension   . Thyroid disease     Past Surgical History  Procedure Date  . Breast biopsy   . Lipoma excision   . Hemorroidectomy   . Total abdominal hysterectomy w/ bilateral salpingoophorectomy   . Abdominal hysterectomy   . Tubal ligation   . Hip surgery 2011    L hip replacement  . Rectal polypectomy 2010    Family History  Problem Relation Age of Onset  . Hyperlipidemia Mother   . Heart disease Mother     heart attack  . Kidney disease Father   . Cancer Sister     multiple myeloma  . Diabetes Brother     Social History History  Substance Use Topics  . Smoking status: Never Smoker   . Smokeless tobacco: Not on file  . Alcohol Use: No    Allergies  Allergen Reactions  . Demerol   . Meperidine Hcl     REACTION: Flushing    Current Outpatient Prescriptions  Medication Sig Dispense Refill  . estrogens, conjugated, (PREMARIN) 0.625 MG tablet Take 0.625 mg by mouth daily. Take daily for 21 days then do not take for 7 days.       . fexofenadine (ALLEGRA) 180 MG tablet Take by mouth daily.         . fish oil-omega-3 fatty acids 1000 MG capsule Take 2 g by mouth daily.        . hydrochlorothiazide 25 MG tablet TAKE 1 TABLET EVERY DAY BY MOUTH  90 tablet  1  . levothyroxine (SYNTHROID, LEVOTHROID) 50 MCG tablet TAKE 1 TABLET BY MOUTH ONCE A DAY  90 tablet  3  . montelukast (SINGULAIR) 10 MG tablet Take 10 mg by mouth at bedtime.        . sulindac (CLINORIL) 150 MG tablet Take 150 mg by mouth 2 (two) times daily.        . AMBULATORY NON FORMULARY MEDICATION Medication Name: Diltiazem 2% ;  Apply to rectum QID, Disp 15 gm, 3 refills  15 g  3    Review of Systems ROS Positive for hypertension, abnormal EKG, seasonal allergies, thyroid disease, chronic constipation, joint pain Physical Exam Physical Exam    Ht 5'7"  Wt 222 142/90  p 45 Well-developed well-nourished female in no apparent distress Focus examination of the perianal region shows a circumferential loose external hemorrhoidal skin with no sign of inflammation. In the posterior midline there is a open anal fissure which is exquisitely tender. No other fissures are noted. Sphincter tone is good.  No sign of perirectal abscess. Assessment/Plan  Anal fissure - posterior midline  Will treat with Diltiazem ointment.  Recommend speaking with her primary care physician about a possible GI referral for chronic constipation.  She should also consider another consultation with a urologic surgeon regarding her recurrent rectocele.  She does not want to return to her previous surgeon, but she can consider going to [facility] or [place].  Recheck in one month.  Cigi Bega K. 12/21/2010, 11:06 AM

## 2011-01-23 ENCOUNTER — Encounter (INDEPENDENT_AMBULATORY_CARE_PROVIDER_SITE_OTHER): Payer: Self-pay | Admitting: Surgery

## 2011-01-25 ENCOUNTER — Ambulatory Visit (INDEPENDENT_AMBULATORY_CARE_PROVIDER_SITE_OTHER): Payer: Federal, State, Local not specified - PPO | Admitting: Surgery

## 2011-01-25 ENCOUNTER — Encounter (INDEPENDENT_AMBULATORY_CARE_PROVIDER_SITE_OTHER): Payer: Self-pay | Admitting: Surgery

## 2011-01-25 VITALS — BP 112/88 | HR 60 | Temp 98.3°F | Ht 67.0 in | Wt 221.4 lb

## 2011-01-25 DIAGNOSIS — K602 Anal fissure, unspecified: Secondary | ICD-10-CM

## 2011-01-25 NOTE — Progress Notes (Signed)
Her fissure is much improved. In fact, the pain had disappeared completely until a few days ago. She had some difficulty with a bowel movement and subsequently developed pain and a little bit of bleeding. On examination the fissure in the posterior midline is very tiny. Minimal tenderness.  Impression: Healing anal fissure posterior midline  Recommendations: Use the diltiazem ointment as needed. Still recommend talking to her primary care physician about a referral possibly to [facility] or [facility] for evaluation of her rectocele.

## 2011-01-26 ENCOUNTER — Encounter (INDEPENDENT_AMBULATORY_CARE_PROVIDER_SITE_OTHER): Payer: Federal, State, Local not specified - PPO | Admitting: Surgery

## 2011-02-20 ENCOUNTER — Telehealth: Payer: Self-pay | Admitting: *Deleted

## 2011-02-20 DIAGNOSIS — D509 Iron deficiency anemia, unspecified: Secondary | ICD-10-CM

## 2011-02-20 NOTE — Telephone Encounter (Signed)
Pt is coming in for labs prior to her physical and she would like to have her iron checked.  She feels tired, has an anal fissure that bleeds a lot, she chews on things a lot- which she did before when her iron was low.  Please advise.

## 2011-02-21 NOTE — Telephone Encounter (Signed)
Let pt know labs have been added.  Forwarded to [employer].

## 2011-02-21 NOTE — Telephone Encounter (Signed)
Patient advised labs added.

## 2011-02-22 ENCOUNTER — Other Ambulatory Visit: Payer: Self-pay | Admitting: Family Medicine

## 2011-02-22 DIAGNOSIS — Z1231 Encounter for screening mammogram for malignant neoplasm of breast: Secondary | ICD-10-CM

## 2011-03-16 ENCOUNTER — Ambulatory Visit
Admission: RE | Admit: 2011-03-16 | Discharge: 2011-03-16 | Disposition: A | Discharge status: Home / Self Care | Payer: Federal, State, Local not specified - PPO | Source: Ambulatory Visit | Attending: Family Medicine | Admitting: Family Medicine

## 2011-03-16 ENCOUNTER — Ambulatory Visit (INDEPENDENT_AMBULATORY_CARE_PROVIDER_SITE_OTHER): Payer: Federal, State, Local not specified - PPO | Admitting: Family Medicine

## 2011-03-16 ENCOUNTER — Encounter: Payer: Self-pay | Admitting: Family Medicine

## 2011-03-16 DIAGNOSIS — K602 Anal fissure, unspecified: Secondary | ICD-10-CM

## 2011-03-16 DIAGNOSIS — N816 Rectocele: Secondary | ICD-10-CM | POA: Insufficient documentation

## 2011-03-16 DIAGNOSIS — Z1231 Encounter for screening mammogram for malignant neoplasm of breast: Secondary | ICD-10-CM

## 2011-03-16 DIAGNOSIS — K921 Melena: Secondary | ICD-10-CM

## 2011-03-16 NOTE — Assessment & Plan Note (Signed)
Not clearly large rectocele, but may be worse when stool in rectal vault.  Will refer to tertiary center for further eval and recommendations.

## 2011-03-16 NOTE — Progress Notes (Signed)
Subjective:    Patient ID: Kristine [name], female    DOB: 02-04-1941, 70 y.o.   MRN: [id number]  HPI  70 year old female with history of 2 rectocele repair (2 years ago, Dr. Sherrine Maples, at same time of repair Dr. Judith Blonder removed rectal polyp benign), past anal fissure in 12/2010 (saw by Dr. Judith Blonder, surgeon, treated with diltiazem gel)... Presents with rectal bleeding.  She has had recurrence of rectocele in last year...she has noted recurrence of not emptying completely, no prolapse. She has to disimpact herself to get BM out. She is interested in referral to different surgeon for rectocele repair consideration. Stool is firm in obstructed area, with soft behind. Using mirilax every few days, benefil daily  1 week ago after straining with BM.Marland Kitchen She has brbpr. Blood in water of commode, drips of blood Mild to moderate pain with BM at that time.  Has continued to happen in past few days. Has history of external  and int hemmorhoids.  Had BM this AM with some bleeding.    Review of Systems  Constitutional: Negative for fever and fatigue.  HENT: Negative for ear pain.   Eyes: Negative for pain.  Respiratory: Negative for chest tightness and shortness of breath.   Cardiovascular: Negative for chest pain, palpitations and leg swelling.  Gastrointestinal: Negative for abdominal pain.  Genitourinary: Negative for dysuria.       Objective:   Physical Exam  Constitutional: Vital signs are normal. She appears well-developed and well-nourished. She is cooperative.  Non-toxic appearance. She does not appear ill. No distress.  HENT:  Head: Normocephalic.  Right Ear: Hearing, tympanic membrane and ear canal normal. Tympanic membrane is not erythematous, not retracted and not bulging.  Left Ear: Hearing, tympanic membrane and ear canal normal. Tympanic membrane is not erythematous, not retracted and not bulging.  Nose: No mucosal edema or rhinorrhea. Right sinus exhibits no maxillary sinus tenderness  and no frontal sinus tenderness. Left sinus exhibits no maxillary sinus tenderness and no frontal sinus tenderness.  Mouth/Throat: Uvula is midline and mucous membranes are normal.  Eyes: Lids are normal. No foreign bodies found.  Neck: Trachea normal and normal range of motion. Neck supple. Carotid bruit is not present. No mass and no thyromegaly present.  Cardiovascular: Normal rate, regular rhythm, S1 normal, S2 normal, normal heart sounds, intact distal pulses and normal pulses.  Exam reveals no gallop and no friction rub.   No murmur heard. Pulmonary/Chest: Effort normal and breath sounds normal. Not tachypneic. No respiratory distress. She has no decreased breath sounds. She has no wheezes. She has no rhonchi. She has no rales.  Abdominal: Soft. Normal appearance and bowel sounds are normal. There is no tenderness.  Genitourinary: Rectal exam shows internal hemorrhoid and fissure. There is no rash, tenderness or lesion on the right labia. There is no rash, tenderness or lesion on the left labia. Right adnexum displays no mass. Left adnexum displays no mass. No erythema or tenderness around the vagina. No signs of injury around the vagina. No vaginal discharge found.       No cystocele, , weakening of posterior vaginal wall, mild rectocele, no prolapse  No bleeding from small int hemmorhoids, many external hemmorhoid tags, no thrombosis.  Tender over rectal fissure.   Neurological: She is alert.  Skin: Skin is warm, dry and intact. No rash noted.  Psychiatric: Her speech is normal and behavior is normal. Judgment and thought content normal. Her mood appears not anxious. Cognition and memory are  normal. She does not exhibit a depressed mood.          Assessment & Plan:

## 2011-03-16 NOTE — Patient Instructions (Addendum)
Stop at front desk to speak with Shirlee Limerick about referral. Treat constipation with mirilax, water and fiber. Restart diltiazem cream but use four times a day instead of just twice daily.

## 2011-03-16 NOTE — Assessment & Plan Note (Addendum)
Treat constipation with mirilax, water and fiber. Restart diltiazem cream but use QID instead of just BID.

## 2011-03-30 ENCOUNTER — Other Ambulatory Visit: Payer: Self-pay | Admitting: Family Medicine

## 2011-04-06 ENCOUNTER — Other Ambulatory Visit (INDEPENDENT_AMBULATORY_CARE_PROVIDER_SITE_OTHER): Payer: Federal, State, Local not specified - PPO

## 2011-04-06 DIAGNOSIS — D509 Iron deficiency anemia, unspecified: Secondary | ICD-10-CM

## 2011-04-06 LAB — CBC WITH DIFFERENTIAL/PLATELET
Basophils Relative: 0.7 % (ref 0.0–3.0)
Eosinophils Absolute: 0.1 10*3/uL (ref 0.0–0.7)
MCHC: 34.2 g/dL (ref 30.0–36.0)
MCV: 88.2 fl (ref 78.0–100.0)
Monocytes Absolute: 0.4 10*3/uL (ref 0.1–1.0)
Neutrophils Relative %: 62.4 % (ref 43.0–77.0)
Platelets: 227 10*3/uL (ref 150.0–400.0)
RBC: 4.4 Mil/uL (ref 3.87–5.11)
RDW: 13.4 % (ref 11.5–14.6)

## 2011-04-10 LAB — TRANSFERRIN: Transferrin: 297 mg/dL (ref 212.0–360.0)

## 2011-04-12 ENCOUNTER — Ambulatory Visit (INDEPENDENT_AMBULATORY_CARE_PROVIDER_SITE_OTHER): Payer: Federal, State, Local not specified - PPO | Admitting: Family Medicine

## 2011-04-12 ENCOUNTER — Encounter: Payer: Self-pay | Admitting: Family Medicine

## 2011-04-12 VITALS — BP 130/80 | HR 80 | Temp 97.9°F | Ht 67.0 in | Wt 219.4 lb

## 2011-04-12 DIAGNOSIS — E039 Hypothyroidism, unspecified: Secondary | ICD-10-CM

## 2011-04-12 DIAGNOSIS — Z23 Encounter for immunization: Secondary | ICD-10-CM

## 2011-04-12 DIAGNOSIS — Z0000 Encounter for general adult medical examination without abnormal findings: Secondary | ICD-10-CM

## 2011-04-12 DIAGNOSIS — R5383 Other fatigue: Secondary | ICD-10-CM

## 2011-04-12 DIAGNOSIS — I10 Essential (primary) hypertension: Secondary | ICD-10-CM

## 2011-04-12 DIAGNOSIS — D509 Iron deficiency anemia, unspecified: Secondary | ICD-10-CM

## 2011-04-12 DIAGNOSIS — E7800 Pure hypercholesterolemia, unspecified: Secondary | ICD-10-CM

## 2011-04-12 MED ORDER — MONTELUKAST SODIUM 10 MG PO TABS: 10.0000 mg | ORAL_TABLET | Freq: Every day | ORAL | Status: DC

## 2011-04-12 NOTE — Assessment & Plan Note (Signed)
Due for re-eval. 

## 2011-04-12 NOTE — Patient Instructions (Addendum)
On way out schedule labs for next Friday. Follow in 6 months, HTN, high cholesterol.

## 2011-04-12 NOTE — Progress Notes (Signed)
Subjective:    Patient ID: Kristine [name], female    DOB: [birth year], 70 y.o.   MRN: [id number]  HPI The patient is here for annual wellness exam and preventative care.    Elevated Cholesterol: Due for re-eval zocor 40 mg daily Using medications without problems: None Muscle aches: None Diet compliance:Good Exercise: Minimal, some walking. Other complaints:  Hypertension:   Well controlled on HCTZ.  Using medication without problems or lightheadedness:  Chest pain with exertion:None Edema:None Short of breath:None, mild SOB after walking up stairs, but deconditioned. Average home BPs:not checking. Other issues:  Low thyroid: Due for re-eval this year. Lab Results  Component Value Date   TSH 3.12 04/03/2010   Appt on 12/3 with surgical clinic at [facility]. Continued issues with rectocele. Continue intermitant issues with anal fissure, occ blood in stool.  Review of Systems  Constitutional: Positive for fatigue. Negative for fever and unexpected weight change.       Mild, intermittant  HENT: Negative for ear pain, congestion, sore throat, sneezing, trouble swallowing and sinus pressure.   Eyes: Negative for pain and itching.  Respiratory: Negative for cough, shortness of breath and wheezing.   Cardiovascular: Negative for chest pain, palpitations and leg swelling.  Gastrointestinal: Positive for constipation, blood in stool and anal bleeding. Negative for nausea, abdominal pain and diarrhea.  Genitourinary: Negative for dysuria, hematuria, vaginal discharge, difficulty urinating and menstrual problem.  Skin: Negative for rash.  Neurological: Negative for syncope, weakness, light-headedness, numbness and headaches.  Psychiatric/Behavioral: Negative for confusion and dysphoric mood. The patient is not nervous/anxious.        Objective:   Physical Exam  Constitutional: Vital signs are normal. She appears well-developed and well-nourished. She is cooperative.  Non-toxic  appearance. She does not appear ill. No distress.  HENT:  Head: Normocephalic.  Right Ear: Hearing, tympanic membrane, external ear and ear canal normal.  Left Ear: Hearing, tympanic membrane, external ear and ear canal normal.  Nose: Nose normal.  Eyes: Conjunctivae, EOM and lids are normal. Pupils are equal, round, and reactive to light. No foreign bodies found.  Neck: Trachea normal and normal range of motion. Neck supple. Carotid bruit is not present. No mass and no thyromegaly present.  Cardiovascular: Normal rate, regular rhythm, S1 normal, S2 normal, normal heart sounds and intact distal pulses.  Exam reveals no gallop.   No murmur heard. Pulmonary/Chest: Effort normal and breath sounds normal. No respiratory distress. She has no wheezes. She has no rhonchi. She has no rales.  Abdominal: Soft. Normal appearance and bowel sounds are normal. She exhibits no distension, no fluid wave, no abdominal bruit and no mass. There is no hepatosplenomegaly. There is no tenderness. There is no rebound, no guarding and no CVA tenderness. No hernia.  Genitourinary: No breast swelling, tenderness, discharge or bleeding. Pelvic exam was performed with patient prone.  Lymphadenopathy:    She has no cervical adenopathy.    She has no axillary adenopathy.  Neurological: She is alert. She has normal strength. No cranial nerve deficit or sensory deficit.  Skin: Skin is warm, dry and intact. No rash noted.  Psychiatric: Her speech is normal and behavior is normal. Judgment normal. Her mood appears not anxious. Cognition and memory are normal. She does not exhibit a depressed mood.          Assessment & Plan:  Complete Physical Exam: The patient's preventative maintenance and recommended screening tests for an annual wellness exam were reviewed in full today. Brought up  to date unless services declined.  Counselled on the importance of diet, exercise, and its role in overall health and mortality. The  patient's FH and SH was reviewed, including their home life, tobacco status, and drug and alcohol status.   Vaccines: Uptodate Td, PNA, SE to  Flu in past:refused.   PAP/DVE:  Pap not indicated Mammogram Nml 01/2011 DEXA:nml 05/2010, repeat in 2 years Colon:  nml 2009, Dr. Juanda Chance, repeat in 10 years: 2019.

## 2011-04-12 NOTE — Assessment & Plan Note (Signed)
Well controlled. Continue current medication. Encouraged exercise, weight loss, healthy eating habits.  

## 2011-04-12 NOTE — Assessment & Plan Note (Signed)
Resolved

## 2011-04-18 ENCOUNTER — Encounter: Payer: Self-pay | Admitting: Family Medicine

## 2011-04-18 ENCOUNTER — Other Ambulatory Visit (INDEPENDENT_AMBULATORY_CARE_PROVIDER_SITE_OTHER): Payer: Federal, State, Local not specified - PPO

## 2011-04-18 ENCOUNTER — Ambulatory Visit (INDEPENDENT_AMBULATORY_CARE_PROVIDER_SITE_OTHER): Payer: Federal, State, Local not specified - PPO | Admitting: Family Medicine

## 2011-04-18 VITALS — BP 120/72 | HR 76 | Temp 97.7°F | Ht 67.0 in | Wt 218.4 lb

## 2011-04-18 DIAGNOSIS — R5381 Other malaise: Secondary | ICD-10-CM

## 2011-04-18 DIAGNOSIS — J069 Acute upper respiratory infection, unspecified: Secondary | ICD-10-CM

## 2011-04-18 DIAGNOSIS — E039 Hypothyroidism, unspecified: Secondary | ICD-10-CM

## 2011-04-18 DIAGNOSIS — E7800 Pure hypercholesterolemia, unspecified: Secondary | ICD-10-CM

## 2011-04-18 DIAGNOSIS — R5383 Other fatigue: Secondary | ICD-10-CM

## 2011-04-18 LAB — LIPID PANEL
Cholesterol: 151 mg/dL (ref 0–200)
LDL Cholesterol: 59 mg/dL (ref 0–99)
Total CHOL/HDL Ratio: 2

## 2011-04-18 LAB — COMPREHENSIVE METABOLIC PANEL
Albumin: 3.7 g/dL (ref 3.5–5.2)
Alkaline Phosphatase: 51 U/L (ref 39–117)
Glucose, Bld: 107 mg/dL — ABNORMAL HIGH (ref 70–99)
Potassium: 3.9 mEq/L (ref 3.5–5.1)
Sodium: 142 mEq/L (ref 135–145)
Total Protein: 6.8 g/dL (ref 6.0–8.3)

## 2011-04-18 MED ORDER — AZITHROMYCIN 250 MG PO TABS: ORAL_TABLET | ORAL | Status: AC

## 2011-04-18 NOTE — Progress Notes (Signed)
  Patient Name: Kristine [name] Date of Birth: 03-20-1941 Medical Record Number: [id number]  History of Present Illness:  Patent presents with runny nose, sneezing, cough, sore throat, malaise and minimal / low-grade fever .   Started to cough last Friday, then started to get worse and worse. Coughing all the time. Sunday had a fever. Now feeling weak, achy, and having a fever. Now is feeling a little bit better. Once start coughing, having a trouble stopping it.  Took some tessalon.   ? recent exposure to others with similar symptoms.   The patent denies sore throat as the primary complaint. Denies sthortness of breath/wheezing, high fever, chest pain, rhinits for more than 14 days, significant myalgia, otalgia, facial pain, abdominal pain, changes in bowel or bladder.  PMH, PHS, Allergies, Problem List, Medications, Family History, and Social History have all been reviewed.  Review of Systems: as above, eating and drinking - tolerating PO. Urinating normally. No excessive vomitting or diarrhea. O/w as above.  Physical Exam:  Filed Vitals:   04/18/11 0932  BP: 120/72  Pulse: 76  Temp: 97.7 F (36.5 C)  TempSrc: Oral  Height: 5\' 7"  (1.702 m)  Weight: 218 lb 6.4 oz (99.066 kg)  SpO2: 99%    GEN: WDWN, Non-toxic, Atraumatic, normocephalic. A and O x 3. HEENT: Oropharynx clear without exudate, MMM, no significant LAD, mild rhinnorhea Ears: TM clear, COL visualized with good landmarks CV: RRR, no m/g/r. Pulm: CTA B, no wheezes, rhonchi, or crackles, normal respiratory effort. EXT: no c/c/e Psych: well oriented, neither depressed nor anxious in appearance  A/P: 1. URI. Supportive care reviewed with patient. See patient instruction section. Can hold ABX given pulmonary symptoms if worsens over weekend

## 2011-04-20 ENCOUNTER — Other Ambulatory Visit: Payer: Federal, State, Local not specified - PPO

## 2011-04-20 LAB — VITAMIN B12: Vitamin B-12: 457 pg/mL (ref 211–911)

## 2011-04-21 ENCOUNTER — Other Ambulatory Visit: Payer: Self-pay | Admitting: Family Medicine

## 2011-09-26 ENCOUNTER — Other Ambulatory Visit: Payer: Self-pay | Admitting: *Deleted

## 2011-09-26 ENCOUNTER — Other Ambulatory Visit: Payer: Self-pay | Admitting: Family Medicine

## 2011-09-26 MED ORDER — HYDROCHLOROTHIAZIDE 25 MG PO TABS: 25.0000 mg | ORAL_TABLET | Freq: Every day | ORAL | Status: DC

## 2011-09-26 NOTE — Telephone Encounter (Signed)
Received faxed refill request from pharmacy. Refill sent to pharmacy electronically. 

## 2011-10-12 ENCOUNTER — Ambulatory Visit: Payer: Federal, State, Local not specified - PPO | Admitting: Family Medicine

## 2011-11-16 ENCOUNTER — Encounter: Payer: Self-pay | Admitting: Family Medicine

## 2011-11-16 ENCOUNTER — Ambulatory Visit (INDEPENDENT_AMBULATORY_CARE_PROVIDER_SITE_OTHER): Payer: Federal, State, Local not specified - PPO | Admitting: Family Medicine

## 2011-11-16 VITALS — BP 120/80 | HR 67 | Temp 98.5°F | Ht 67.0 in | Wt 223.5 lb

## 2011-11-16 DIAGNOSIS — M545 Low back pain: Secondary | ICD-10-CM | POA: Insufficient documentation

## 2011-11-16 DIAGNOSIS — E7800 Pure hypercholesterolemia, unspecified: Secondary | ICD-10-CM

## 2011-11-16 DIAGNOSIS — I10 Essential (primary) hypertension: Secondary | ICD-10-CM

## 2011-11-16 DIAGNOSIS — G8929 Other chronic pain: Secondary | ICD-10-CM | POA: Insufficient documentation

## 2011-11-16 DIAGNOSIS — E039 Hypothyroidism, unspecified: Secondary | ICD-10-CM

## 2011-11-16 DIAGNOSIS — K5900 Constipation, unspecified: Secondary | ICD-10-CM

## 2011-11-16 NOTE — Patient Instructions (Addendum)
Elevate legs above heart, consider compression hose. Regular exercise. We will try Tens Unit again for muscle related low back pain. If not helping can try muscle relaxant. Also use sulindac, heat and gentle stretching in low back.  Follow up if not improving in 2 weeks.

## 2011-11-16 NOTE — Assessment & Plan Note (Signed)
Stable on last check.

## 2011-11-16 NOTE — Progress Notes (Signed)
Subjective:    Patient ID: Kristine [name], female    DOB: [birth year], 71 y.o.   MRN: [id number]  HPI  Has been having chronic low back pain for many years, worse inlast few weeks. No recent falls. No radiation of pain. No weakness, no numbness. Worse after sitting a while, stiff in low back 3-4 years ago had a MRI lumbar spine: degenerative changes, no herniated disc. In past sent to PT. Never saw an othopedic MD.  using sulindac for pain.  She cannot find TENS unit she had, but this used to help.  Elevated Cholesterol: At goal last check on zocor 40 mg daily  Lab Results  Component Value Date   CHOL 151 04/18/2011   HDL 62.40 04/18/2011   LDLCALC 59 04/18/2011   LDLDIRECT 110.3 03/17/2009   TRIG 146.0 04/18/2011   CHOLHDL 2 04/18/2011  Using medications without problems: None  Muscle aches: None  Diet compliance:Good  Exercise: Minimal, some walking.  Other complaints:   Hypertension: Well controlled on HCTZ. Using medication without problems or lightheadedness:  Chest pain with exertion:None  Edema:Has noted some at end of the day. Short of breath:None, mild SOB after walking up stairs, but deconditioned.  Average home BPs:not checking.  Other issues:   Low thyroid: Well controlled Lab Results  Component Value Date   TSH 3.52 04/18/2011    Had appt on 12/3 with surgical clinic at [facility].  Rectocele:Issue improved with daily mirilax.     Review of Systems  Constitutional: Negative for fever and fatigue.  HENT: Negative for ear pain.   Eyes: Negative for pain.  Respiratory: Negative for chest tightness and shortness of breath.   Cardiovascular: Negative for chest pain, palpitations and leg swelling.  Gastrointestinal: Negative for abdominal pain.  Genitourinary: Negative for dysuria.       Objective:   Physical Exam  Constitutional: Vital signs are normal. She appears well-developed and well-nourished. She is cooperative.  Non-toxic appearance. She does  not appear ill. No distress.  HENT:  Head: Normocephalic.  Right Ear: Hearing, tympanic membrane, external ear and ear canal normal. Tympanic membrane is not erythematous, not retracted and not bulging.  Left Ear: Hearing, tympanic membrane, external ear and ear canal normal. Tympanic membrane is not erythematous, not retracted and not bulging.  Nose: No mucosal edema or rhinorrhea. Right sinus exhibits no maxillary sinus tenderness and no frontal sinus tenderness. Left sinus exhibits no maxillary sinus tenderness and no frontal sinus tenderness.  Mouth/Throat: Uvula is midline, oropharynx is clear and moist and mucous membranes are normal.  Eyes: Conjunctivae, EOM and lids are normal. Pupils are equal, round, and reactive to light. No foreign bodies found.  Neck: Trachea normal and normal range of motion. Neck supple. Carotid bruit is not present. No mass and no thyromegaly present.  Cardiovascular: Normal rate, regular rhythm, S1 normal, S2 normal, normal heart sounds, intact distal pulses and normal pulses.  Exam reveals no gallop and no friction rub.   No murmur heard. Pulmonary/Chest: Effort normal and breath sounds normal. Not tachypneic. No respiratory distress. She has no decreased breath sounds. She has no wheezes. She has no rhonchi. She has no rales.  Abdominal: Soft. Normal appearance and bowel sounds are normal. There is no tenderness.  Musculoskeletal:       Lumbar back: She exhibits decreased range of motion and tenderness. She exhibits no bony tenderness.       Neg faber and SLR B. TTP on right paraspinous muscle  no sciatic  notch ttp.  Neurological: She is alert.  Skin: Skin is warm, dry and intact. No rash noted.  Psychiatric: Her speech is normal and behavior is normal. Judgment and thought content normal. Her mood appears not anxious. Cognition and memory are normal. She does not exhibit a depressed mood.          Assessment & Plan:

## 2011-11-16 NOTE — Assessment & Plan Note (Signed)
Well controlled. Continue current medication.  

## 2011-11-16 NOTE — Assessment & Plan Note (Signed)
Most consistent with paraspinous muscle strain. Treat with sulindac, heat, exercise and will p[rescribe TENs unit. If not improving consider muscle relaxant. Pt not interested in returning to PT.

## 2011-11-16 NOTE — Assessment & Plan Note (Signed)
Improved on daily miralax 

## 2011-11-16 NOTE — Assessment & Plan Note (Signed)
Well controlled on last check.

## 2012-01-04 ENCOUNTER — Telehealth: Payer: Self-pay

## 2012-01-04 MED ORDER — AMOXICILLIN 500 MG PO CAPS: ORAL_CAPSULE | ORAL | Status: DC

## 2012-01-04 NOTE — Telephone Encounter (Signed)
Pt request antibiotic prior to dental appt on Mon 01/07/12 sent to [facility] [place] today. Pt had hip replacement 2 years ago and surgeon had been ordering antibiotic but pt wants to get from PCP. Please advise.

## 2012-01-04 NOTE — Telephone Encounter (Signed)
Amoxicillin 500 mg, 4 tabs at once 30 mins before dental appt. #4, 0 refills

## 2012-02-06 ENCOUNTER — Other Ambulatory Visit: Payer: Self-pay | Admitting: Family Medicine

## 2012-03-21 ENCOUNTER — Other Ambulatory Visit: Payer: Self-pay | Admitting: Family Medicine

## 2012-03-31 ENCOUNTER — Other Ambulatory Visit: Payer: Self-pay | Admitting: Family Medicine

## 2012-03-31 DIAGNOSIS — Z1231 Encounter for screening mammogram for malignant neoplasm of breast: Secondary | ICD-10-CM

## 2012-04-04 ENCOUNTER — Telehealth: Payer: Self-pay | Admitting: Family Medicine

## 2012-04-04 DIAGNOSIS — D509 Iron deficiency anemia, unspecified: Secondary | ICD-10-CM

## 2012-04-04 DIAGNOSIS — E039 Hypothyroidism, unspecified: Secondary | ICD-10-CM

## 2012-04-04 DIAGNOSIS — E7800 Pure hypercholesterolemia, unspecified: Secondary | ICD-10-CM

## 2012-04-04 DIAGNOSIS — I10 Essential (primary) hypertension: Secondary | ICD-10-CM

## 2012-04-04 NOTE — Telephone Encounter (Signed)
Message copied by Excell Seltzer on Fri Apr 04, 2012  2:55 PM ------      Message from: Baldomero Lamy      Created: Fri Mar 28, 2012 10:56 AM      Regarding: Cpx labs Tues 11/12       Please order  future cpx labs for pt's upcomming lab appt.      Thanks      Rodney Booze

## 2012-04-06 ENCOUNTER — Telehealth: Payer: Self-pay | Admitting: Family Medicine

## 2012-04-06 DIAGNOSIS — D509 Iron deficiency anemia, unspecified: Secondary | ICD-10-CM

## 2012-04-06 DIAGNOSIS — E7800 Pure hypercholesterolemia, unspecified: Secondary | ICD-10-CM

## 2012-04-06 DIAGNOSIS — E039 Hypothyroidism, unspecified: Secondary | ICD-10-CM

## 2012-04-06 NOTE — Telephone Encounter (Signed)
Message copied by Excell Seltzer on Sun Apr 06, 2012 11:41 PM ------      Message from: Baldomero Lamy      Created: Fri Mar 28, 2012 10:56 AM      Regarding: Cpx labs Tues 11/12       Please order  future cpx labs for pt's upcomming lab appt.      Thanks      Rodney Booze

## 2012-04-08 ENCOUNTER — Other Ambulatory Visit (INDEPENDENT_AMBULATORY_CARE_PROVIDER_SITE_OTHER): Payer: Federal, State, Local not specified - PPO

## 2012-04-08 DIAGNOSIS — D509 Iron deficiency anemia, unspecified: Secondary | ICD-10-CM

## 2012-04-08 DIAGNOSIS — I10 Essential (primary) hypertension: Secondary | ICD-10-CM

## 2012-04-08 DIAGNOSIS — E039 Hypothyroidism, unspecified: Secondary | ICD-10-CM

## 2012-04-08 DIAGNOSIS — E7800 Pure hypercholesterolemia, unspecified: Secondary | ICD-10-CM

## 2012-04-08 LAB — CBC WITH DIFFERENTIAL/PLATELET
Basophils Absolute: 0 10*3/uL (ref 0.0–0.1)
Eosinophils Absolute: 0.3 10*3/uL (ref 0.0–0.7)
HCT: 40.8 % (ref 36.0–46.0)
Lymphs Abs: 1.4 10*3/uL (ref 0.7–4.0)
MCHC: 33.4 g/dL (ref 30.0–36.0)
MCV: 89.4 fl (ref 78.0–100.0)
Monocytes Absolute: 0.4 10*3/uL (ref 0.1–1.0)
Monocytes Relative: 7.6 % (ref 3.0–12.0)
Neutro Abs: 3.7 10*3/uL (ref 1.4–7.7)
Platelets: 202 10*3/uL (ref 150.0–400.0)
RDW: 13.1 % (ref 11.5–14.6)

## 2012-04-08 LAB — COMPREHENSIVE METABOLIC PANEL
ALT: 14 U/L (ref 0–35)
AST: 18 U/L (ref 0–37)
CO2: 24 mEq/L (ref 19–32)
GFR: 108.54 mL/min (ref >60.00)
Sodium: 139 mEq/L (ref 135–145)
Total Bilirubin: 0.6 mg/dL (ref 0.3–1.2)
Total Protein: 6.3 g/dL (ref 6.0–8.3)

## 2012-04-08 LAB — LIPID PANEL
Cholesterol: 169 mg/dL (ref 0–200)
LDL Cholesterol: 73 mg/dL (ref 0–99)
Total CHOL/HDL Ratio: 3
Triglycerides: 177 mg/dL — ABNORMAL HIGH (ref 0.0–149.0)
VLDL: 35.4 mg/dL (ref 0.0–40.0)

## 2012-04-08 LAB — TSH: TSH: 3.34 u[IU]/mL (ref 0.35–5.50)

## 2012-04-14 ENCOUNTER — Encounter: Payer: Self-pay | Admitting: Family Medicine

## 2012-04-14 ENCOUNTER — Ambulatory Visit (INDEPENDENT_AMBULATORY_CARE_PROVIDER_SITE_OTHER): Payer: Federal, State, Local not specified - PPO | Admitting: Family Medicine

## 2012-04-14 VITALS — BP 120/78 | HR 67 | Temp 97.7°F | Ht 67.0 in | Wt 228.0 lb

## 2012-04-14 DIAGNOSIS — Z0000 Encounter for general adult medical examination without abnormal findings: Secondary | ICD-10-CM

## 2012-04-14 DIAGNOSIS — E039 Hypothyroidism, unspecified: Secondary | ICD-10-CM

## 2012-04-14 DIAGNOSIS — I10 Essential (primary) hypertension: Secondary | ICD-10-CM

## 2012-04-14 DIAGNOSIS — E7800 Pure hypercholesterolemia, unspecified: Secondary | ICD-10-CM

## 2012-04-14 MED ORDER — MONTELUKAST SODIUM 10 MG PO TABS: 10.0000 mg | ORAL_TABLET | Freq: Every day | ORAL | Status: DC

## 2012-04-14 NOTE — Assessment & Plan Note (Signed)
Well controlled. Continue current medication.  

## 2012-04-14 NOTE — Progress Notes (Signed)
  Subjective:    Patient ID: Kristine [name], female    DOB: 03-08-1941, 71 y.o.   MRN: [id number]  HPI The patient is here for annual wellness exam and preventative care.   Elevated Cholesterol: At goal LDL <130 on zocor 40 mg daily, trig high Lab Results  Component Value Date   CHOL 169 04/08/2012   HDL 60.30 04/08/2012   LDLCALC 73 04/08/2012   LDLDIRECT 110.3 03/17/2009   TRIG 177.0* 04/08/2012   CHOLHDL 3 04/08/2012  Using medications without problems: None  Muscle aches: None  Diet compliance:Good  Exercise: Minimal, some walking.  Other complaints:   Hypertension: Well controlled on HCTZ.  Using medication without problems or lightheadedness:  Chest pain with exertion:None  Edema:None  Short of breath:None, mild SOB after walking up stairs, but deconditioned.  Average home BPs:not checking.  Other issues:   Low thyroid: Stable on current med dose. Lab Results  Component Value Date   TSH 3.34 04/08/2012    Recent retirement going well.     Review of Systems  Constitutional: Negative for fever, fatigue and unexpected weight change.  HENT: Negative for ear pain, congestion, sore throat, sneezing, trouble swallowing and sinus pressure.   Eyes: Negative for pain and itching.  Respiratory: Negative for cough, shortness of breath and wheezing.   Cardiovascular: Negative for chest pain, palpitations and leg swelling.  Gastrointestinal: Negative for nausea, abdominal pain, diarrhea, constipation and blood in stool.  Genitourinary: Negative for dysuria, hematuria, vaginal discharge, difficulty urinating and menstrual problem.  Skin: Negative for rash.  Neurological: Negative for syncope, weakness, light-headedness, numbness and headaches.  Psychiatric/Behavioral: Negative for confusion and dysphoric mood. The patient is not nervous/anxious.        Objective:   Physical Exam        Assessment & Plan:  Complete Physical Exam: The patient's preventative  maintenance and recommended screening tests for an annual wellness exam were reviewed in full today.  Brought up to date unless services declined.  Counselled on the importance of diet, exercise, and its role in overall health and mortality.  The patient's FH and SH was reviewed, including their home life, tobacco status, and drug and alcohol status.   Vaccines: Uptodate Td, PNA,zoster;    SE to Flu in past:refused.  PAP/DVE: Pap not indicated  Mammogram Nml 01/2011, scheduled for 04/2012 DEXA:nml 05/2010, repeat in 2 years  Colon: nml 2009, Dr. Juanda Chance, repeat in 10 years: 2019. Nonsmoker

## 2012-04-14 NOTE — Patient Instructions (Signed)
Increase back to fish oil as you were previously on.  Work on regular exercise and weight loss.

## 2012-04-18 ENCOUNTER — Other Ambulatory Visit: Payer: Self-pay | Admitting: Family Medicine

## 2012-04-26 ENCOUNTER — Other Ambulatory Visit: Payer: Self-pay | Admitting: Family Medicine

## 2012-04-28 ENCOUNTER — Ambulatory Visit
Admission: RE | Admit: 2012-04-28 | Discharge: 2012-04-28 | Disposition: A | Discharge status: Home / Self Care | Payer: Federal, State, Local not specified - PPO | Source: Ambulatory Visit | Attending: Family Medicine | Admitting: Family Medicine

## 2012-04-28 DIAGNOSIS — Z1231 Encounter for screening mammogram for malignant neoplasm of breast: Secondary | ICD-10-CM

## 2012-05-13 ENCOUNTER — Other Ambulatory Visit: Payer: Self-pay | Admitting: Family Medicine

## 2012-08-11 ENCOUNTER — Other Ambulatory Visit: Payer: Self-pay | Admitting: Family Medicine

## 2012-09-13 ENCOUNTER — Other Ambulatory Visit: Payer: Self-pay | Admitting: Family Medicine

## 2012-10-14 ENCOUNTER — Encounter: Payer: Self-pay | Admitting: Family Medicine

## 2012-10-14 ENCOUNTER — Ambulatory Visit (INDEPENDENT_AMBULATORY_CARE_PROVIDER_SITE_OTHER): Payer: Federal, State, Local not specified - PPO | Admitting: Family Medicine

## 2012-10-14 VITALS — BP 120/74 | HR 70 | Temp 98.3°F | Wt 229.8 lb

## 2012-10-14 DIAGNOSIS — E039 Hypothyroidism, unspecified: Secondary | ICD-10-CM

## 2012-10-14 DIAGNOSIS — I10 Essential (primary) hypertension: Secondary | ICD-10-CM

## 2012-10-14 NOTE — Assessment & Plan Note (Signed)
Re-eval in 6 months at 1 year check.

## 2012-10-14 NOTE — Progress Notes (Signed)
  Subjective:    Patient ID: Kristine [name], female    DOB: November 29, 1940, 72 y.o.   MRN: [id number]  HPI  72 year old femlae presents for 6 month follow up.   Having allergy flares...  Using allegra and singulair.  Tolerating fairly well. She is not interested in any other treatment.  Elevated Cholesterol: At goal LDL <130 on zocor 40 mg daily, trig high  Lab Results  Component Value Date   CHOL 169 04/08/2012   HDL 60.30 04/08/2012   LDLCALC 73 04/08/2012   LDLDIRECT 110.3 03/17/2009   TRIG 177.0* 04/08/2012   CHOLHDL 3 04/08/2012  Using medications without problems: None  Muscle aches: None  Diet compliance:Good  Exercise: Walking 4-5 times a week. More than previously. Other complaints:   Hypertension: Well controlled on HCTZ.  Using medication without problems or lightheadedness: None Chest pain with exertion:None  Edema:None  Short of breath:None, mild SOB after walking up stairs, but deconditioned.  Average home BPs:not checking. Other issues:   Low thyroid: Stable on current med dose.  Lab Results   Component  Value  Date    TSH  3.34  04/08/2012   Recent retirement going well.        Review of Systems  Constitutional: Negative for fever and fatigue.  HENT: Negative for ear pain.   Eyes: Negative for pain.  Respiratory: Negative for chest tightness and shortness of breath.   Cardiovascular: Negative for chest pain, palpitations and leg swelling.  Gastrointestinal: Negative for abdominal pain.  Genitourinary: Negative for dysuria.       Objective:   Physical Exam  Constitutional: She is oriented to person, place, and time. Vital signs are normal. She appears well-developed and well-nourished. She is cooperative.  Non-toxic appearance. She does not appear ill. No distress.  overweight  HENT:  Head: Normocephalic.  Right Ear: Hearing, tympanic membrane and ear canal normal. Tympanic membrane is not erythematous, not retracted and not bulging.  Left Ear:  Hearing, tympanic membrane, external ear and ear canal normal. Tympanic membrane is not erythematous, not retracted and not bulging.  Nose: No mucosal edema or rhinorrhea. Right sinus exhibits no maxillary sinus tenderness and no frontal sinus tenderness. Left sinus exhibits no maxillary sinus tenderness and no frontal sinus tenderness.  Mouth/Throat: Uvula is midline, oropharynx is clear and moist and mucous membranes are normal.  Eyes: Conjunctivae, EOM and lids are normal. Pupils are equal, round, and reactive to light. No foreign bodies found.  Neck: Trachea normal and normal range of motion. Neck supple. Carotid bruit is not present. No mass and no thyromegaly present.  Cardiovascular: Normal rate, regular rhythm, S1 normal, S2 normal, normal heart sounds, intact distal pulses and normal pulses.  Exam reveals no gallop and no friction rub.   No murmur heard. Pulmonary/Chest: Effort normal and breath sounds normal. Not tachypneic. No respiratory distress. She has no decreased breath sounds. She has no wheezes. She has no rhonchi. She has no rales.  Abdominal: Soft. Normal appearance and bowel sounds are normal. There is no tenderness.  Neurological: She is alert and oriented to person, place, and time.  Skin: Skin is warm, dry and intact. No rash noted.  Psychiatric: Her speech is normal and behavior is normal. Judgment and thought content normal. Her mood appears not anxious. Cognition and memory are normal. She does not exhibit a depressed mood.          Assessment & Plan:

## 2012-10-14 NOTE — Patient Instructions (Addendum)
Continue working on healthy eating, weight loss and regular exercise.  

## 2012-10-14 NOTE — Assessment & Plan Note (Signed)
Well controlled. Continue current medication.  

## 2012-11-13 ENCOUNTER — Other Ambulatory Visit: Payer: Self-pay | Admitting: Family Medicine

## 2012-12-11 ENCOUNTER — Other Ambulatory Visit: Payer: Self-pay | Admitting: Family Medicine

## 2013-02-09 ENCOUNTER — Other Ambulatory Visit: Payer: Self-pay | Admitting: Family Medicine

## 2013-04-02 ENCOUNTER — Other Ambulatory Visit: Payer: Self-pay

## 2013-04-13 ENCOUNTER — Other Ambulatory Visit: Payer: Federal, State, Local not specified - PPO

## 2013-04-14 ENCOUNTER — Telehealth (INDEPENDENT_AMBULATORY_CARE_PROVIDER_SITE_OTHER): Payer: Federal, State, Local not specified - PPO | Admitting: Family Medicine

## 2013-04-14 ENCOUNTER — Other Ambulatory Visit (INDEPENDENT_AMBULATORY_CARE_PROVIDER_SITE_OTHER): Payer: Federal, State, Local not specified - PPO

## 2013-04-14 DIAGNOSIS — E7800 Pure hypercholesterolemia, unspecified: Secondary | ICD-10-CM

## 2013-04-14 DIAGNOSIS — E039 Hypothyroidism, unspecified: Secondary | ICD-10-CM

## 2013-04-14 DIAGNOSIS — I10 Essential (primary) hypertension: Secondary | ICD-10-CM

## 2013-04-14 DIAGNOSIS — D509 Iron deficiency anemia, unspecified: Secondary | ICD-10-CM

## 2013-04-14 LAB — CBC WITH DIFFERENTIAL/PLATELET
Basophils Absolute: 0 10*3/uL (ref 0.0–0.1)
Eosinophils Absolute: 0.4 10*3/uL (ref 0.0–0.7)
Lymphocytes Relative: 26.2 % (ref 12.0–46.0)
MCHC: 34.2 g/dL (ref 30.0–36.0)
MCV: 88.1 fl (ref 78.0–100.0)
Monocytes Absolute: 0.4 10*3/uL (ref 0.1–1.0)
Neutro Abs: 3.8 10*3/uL (ref 1.4–7.7)
Neutrophils Relative %: 61.2 % (ref 43.0–77.0)
RDW: 13.4 % (ref 11.5–14.6)

## 2013-04-14 LAB — COMPREHENSIVE METABOLIC PANEL
AST: 13 U/L (ref 0–37)
Alkaline Phosphatase: 46 U/L (ref 39–117)
BUN: 18 mg/dL (ref 6–23)
Creatinine, Ser: 0.7 mg/dL (ref 0.4–1.2)
Total Bilirubin: 0.8 mg/dL (ref 0.3–1.2)

## 2013-04-14 LAB — LIPID PANEL
Cholesterol: 186 mg/dL (ref 0–200)
LDL Cholesterol: 87 mg/dL (ref 0–99)
Triglycerides: 193 mg/dL — ABNORMAL HIGH (ref 0.0–149.0)
VLDL: 38.6 mg/dL (ref 0.0–40.0)

## 2013-04-14 NOTE — Telephone Encounter (Signed)
Message copied by Excell Seltzer on Tue Apr 14, 2013 11:16 AM ------      Message from: Alvina Chou      Created: Tue Apr 14, 2013  8:42 AM      Regarding: Lab orders for now       Patient is scheduled for CPX labs, please order future labs, Thanks , Kristine [name]        Thank You ------

## 2013-04-16 ENCOUNTER — Ambulatory Visit (INDEPENDENT_AMBULATORY_CARE_PROVIDER_SITE_OTHER): Payer: Federal, State, Local not specified - PPO | Admitting: Family Medicine

## 2013-04-16 ENCOUNTER — Telehealth: Payer: Self-pay

## 2013-04-16 ENCOUNTER — Encounter: Payer: Self-pay | Admitting: Family Medicine

## 2013-04-16 VITALS — BP 140/72 | HR 66 | Temp 97.5°F | Ht 66.0 in | Wt 224.2 lb

## 2013-04-16 DIAGNOSIS — E538 Deficiency of other specified B group vitamins: Secondary | ICD-10-CM

## 2013-04-16 DIAGNOSIS — E7800 Pure hypercholesterolemia, unspecified: Secondary | ICD-10-CM

## 2013-04-16 DIAGNOSIS — J309 Allergic rhinitis, unspecified: Secondary | ICD-10-CM

## 2013-04-16 DIAGNOSIS — I10 Essential (primary) hypertension: Secondary | ICD-10-CM

## 2013-04-16 DIAGNOSIS — Z0000 Encounter for general adult medical examination without abnormal findings: Secondary | ICD-10-CM

## 2013-04-16 MED ORDER — SULINDAC 150 MG PO TABS: ORAL_TABLET | ORAL | Status: DC

## 2013-04-16 MED ORDER — LEVOTHYROXINE SODIUM 50 MCG PO TABS: ORAL_TABLET | ORAL | Status: DC

## 2013-04-16 MED ORDER — ESTROGENS CONJUGATED 0.625 MG PO TABS: ORAL_TABLET | ORAL | Status: DC

## 2013-04-16 MED ORDER — SIMVASTATIN 40 MG PO TABS: ORAL_TABLET | ORAL | Status: DC

## 2013-04-16 MED ORDER — HYDROCHLOROTHIAZIDE 25 MG PO TABS: ORAL_TABLET | ORAL | Status: DC

## 2013-04-16 MED ORDER — FLUTICASONE FUROATE 27.5 MCG/SPRAY NA SUSP: 2.0000 | Freq: Every day | NASAL | Status: DC

## 2013-04-16 MED ORDER — MONTELUKAST SODIUM 10 MG PO TABS: 10.0000 mg | ORAL_TABLET | Freq: Every day | ORAL | Status: DC

## 2013-04-16 NOTE — Telephone Encounter (Signed)
Mercia notified instructions are two sprays into each nostril daily.

## 2013-04-16 NOTE — Telephone Encounter (Signed)
Pt left v/m; pt was seen today and wanted to clarify instructions for veramyst; instructions say to place 2 sprays into the nose daily and pt wants to know is that 2 sprays in each nostril or 1 spray in each nostril.pt request cb.

## 2013-04-16 NOTE — Progress Notes (Addendum)
HPI  The patient is here for annual wellness exam and preventative care.   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 Recent surgery.    Has appt with Dr. Roswell Miners for right hi[p pain. Hx of left hip pain.   Poor control allergies, congestion in nose, sneezing. On allegra and singulair.  Occ cough, no post nasal drip.  Clear mucus nasal discharge.  She is interested in trying zyrtec OTC instead of allegra.   Elevated Cholesterol: At goal LDL <130 on zocor 40 mg daily, trig high  Lab Results  Component Value Date   CHOL 186 04/14/2013   HDL 60.60 04/14/2013   LDLCALC 87 04/14/2013   LDLDIRECT 110.3 03/17/2009   TRIG 193.0* 04/14/2013   CHOLHDL 3 04/14/2013   Using medications without problems: None  Muscle aches: None  Diet compliance:Good  Exercise: Minimal, some walking.  Other complaints:   Hypertension: Well controlled on HCTZ, slightly elevated today. BP Readings from Last 3 Encounters:  10/14/12 120/74  04/14/12 120/78  11/16/11 120/80  Using medication without problems or lightheadedness:  Chest pain with exertion:None  Edema:None  Short of breath:None Average home BPs: Not checking.  Other issues:   Low thyroid: Stable on current med dose.  Lab Results  Component Value Date   TSH 4.71 04/14/2013   Review of Systems  Constitutional: Negative for fever, fatigue and unexpected weight change.  HENT: Negative for ear pain, congestion, sore throat, sneezing, trouble swallowing and sinus pressure.  Eyes: Negative for pain and itching.  Respiratory: Negative for cough, shortness of breath and wheezing.  Cardiovascular: Negative for chest pain, palpitations and leg swelling.  Gastrointestinal: Negative for nausea, abdominal pain, diarrhea, constipation and blood in stool.  Genitourinary: Negative for dysuria, hematuria, vaginal discharge, difficulty urinating and menstrual problem.  Skin: Negative for rash.  Neurological: Negative for syncope,  weakness, light-headedness, numbness and headaches.  Psychiatric/Behavioral: Negative for confusion and dysphoric mood. The patient is not nervous/anxious.  Objective:   Physical Exam  Physical Exam  Constitutional: Vital signs are normal. She appears well-developed and well-nourished. She is cooperative.  Non-toxic appearance. She does not appear ill. No distress.  overweight  HENT:  Head: Normocephalic.  Right Ear: Hearing, tympanic membrane, external ear and ear canal normal.  Left Ear: Hearing, tympanic membrane, external ear and ear canal normal.  Nose: Nose normal.  Eyes: Conjunctivae, EOM and lids are normal. Pupils are equal, round, and reactive to light. Lids are everted and swept, no foreign bodies found.  Neck: Trachea normal and normal range of motion. Neck supple. Carotid bruit is not present. No mass and no thyromegaly present.  Cardiovascular: Normal rate, regular rhythm, S1 normal, S2 normal, normal heart sounds and intact distal pulses.  Exam reveals no gallop.   No murmur heard. Pulmonary/Chest: Effort normal and breath sounds normal. No respiratory distress. She has no wheezes. She has no rhonchi. She has no rales.  Abdominal: Soft. Normal appearance and bowel sounds are normal. She exhibits no distension, no fluid wave, no abdominal bruit and no mass. There is no hepatosplenomegaly. There is no tenderness. There is no rebound, no guarding and no CVA tenderness. No hernia.  Genitourinary: No breast swelling, tenderness, discharge or bleeding. Pelvic exam was performed with patient prone.  Lymphadenopathy:    She has no cervical adenopathy.    She has no axillary adenopathy.  Neurological: She is alert. She has normal strength. No cranial nerve deficit or sensory deficit.  Skin: Skin is warm, dry  and intact. No rash noted.  Psychiatric: Her speech is normal and behavior is normal. Judgment normal. Her mood appears not anxious. Cognition and memory are normal. She does not  exhibit a depressed mood.     Assessment & Plan:   Complete Physical Exam: The patient's preventative maintenance and recommended screening tests for an annual wellness exam were reviewed in full today.  Brought up to date unless services declined.  Counselled on the importance of diet, exercise, and its role in overall health and mortality.  The patient's FH and SH was reviewed, including their home life, tobacco status, and drug and alcohol status.   Vaccines: Uptodate Td, PNA,zoster; SE to Flu in past:refused.  PAP/DVE: Pap?DVE not indicated  Mammogram Nml 03/2012, due  DEXA:nml 05/2010, repeat in 5 years  Colon: nml 2009, Dr. Juanda Chance, repeat in 10 years: 2019. Nonsmoker

## 2013-04-16 NOTE — Addendum Note (Signed)
Addended by: Damita Lack on: 04/16/2013 11:35 AM   Modules accepted: Orders

## 2013-04-16 NOTE — Assessment & Plan Note (Signed)
Trial of zyrtec and veramyst.

## 2013-04-16 NOTE — Patient Instructions (Addendum)
Can try zyrtec in place of allegra. Can try nasal steroid as well.  Follow BP at home.. Call if greater than 140/90 consistently.  Continue working on exercise, weight loss and low carb/low fat diet. Can increase to max 4000 mg divided daily.  Schedule mammogram on your own.  Follow up in 6 months with labs prior.

## 2013-04-16 NOTE — Addendum Note (Signed)
Addended by: Kerby Nora E on: 04/16/2013 11:20 AM   Modules accepted: Orders

## 2013-04-16 NOTE — Progress Notes (Signed)
Pre-visit discussion using our clinic review tool. No additional management support is needed unless otherwise documented below in the visit note.  

## 2013-04-16 NOTE — Assessment & Plan Note (Signed)
Well controlled. Continue current medication.  

## 2013-04-16 NOTE — Assessment & Plan Note (Signed)
LDL at goal on current regimen. 

## 2013-04-17 ENCOUNTER — Encounter: Payer: Self-pay | Admitting: *Deleted

## 2013-05-06 ENCOUNTER — Other Ambulatory Visit: Payer: Self-pay

## 2013-05-06 DIAGNOSIS — Z1231 Encounter for screening mammogram for malignant neoplasm of breast: Secondary | ICD-10-CM

## 2013-05-15 ENCOUNTER — Encounter: Payer: Self-pay | Admitting: Family Medicine

## 2013-05-15 ENCOUNTER — Ambulatory Visit (INDEPENDENT_AMBULATORY_CARE_PROVIDER_SITE_OTHER): Payer: Federal, State, Local not specified - PPO | Admitting: Family Medicine

## 2013-05-15 VITALS — BP 150/80 | HR 68 | Temp 97.5°F | Ht 66.0 in | Wt 227.5 lb

## 2013-05-15 DIAGNOSIS — I10 Essential (primary) hypertension: Secondary | ICD-10-CM

## 2013-05-15 MED ORDER — LOSARTAN POTASSIUM-HCTZ 50-12.5 MG PO TABS: 1.0000 | ORAL_TABLET | Freq: Every day | ORAL | Status: DC

## 2013-05-15 NOTE — Patient Instructions (Signed)
Stop HCTZ and start losartan/HCTZ. Follow up BP check in 2 weeks, will check kidney function at that time. Get a new cuff or have current one calibrated. Follow BP at home, goal < 140/90.

## 2013-05-15 NOTE — Progress Notes (Signed)
Pre-visit discussion using our clinic review tool. No additional management support is needed unless otherwise documented below in the visit note.  

## 2013-05-15 NOTE — Assessment & Plan Note (Signed)
EKG nml. Cahnge HCTZ to losartan HCTZ.  Follow up in 2 weeks. Chek Cr at that time.

## 2013-05-15 NOTE — Progress Notes (Signed)
   Subjective:    Patient ID: Kristine [name], female    DOB: 17-Apr-1941, 72 y.o.   MRN: [id number]  HPI  72 year old female presents with recent increase in BP measurement in last few months.   BP Readings from Last 3 Encounters:  05/15/13 150/80  04/16/13 140/72  10/14/12 120/74  Using medication without problems or lightheadedness:  On HCTZ 25 mg daily  Chest pain with exertion: None, she did have an episode of heart racing with exertion few days ago improved after rest 30secs.  Edema:None Short of breath: None Average home BPs: At home BPs have been 160- 180/90 Other issues: She is not having headache or blurred vision.   No new meds, no change in weight, no anxiety.  Nml thyrpid, nml CMET on 11/18   Review of Systems  Constitutional: Negative for fever and fatigue.  HENT: Negative for ear pain.   Eyes: Negative for pain.  Respiratory: Negative for chest tightness and shortness of breath.   Cardiovascular: Positive for palpitations. Negative for chest pain and leg swelling.  Gastrointestinal: Negative for abdominal pain.  Genitourinary: Negative for dysuria.       Objective:   Physical Exam  Constitutional: Vital signs are normal. She appears well-developed and well-nourished. She is cooperative.  Non-toxic appearance. She does not appear ill. No distress.  Overweight  HENT:  Head: Normocephalic.  Right Ear: Hearing, tympanic membrane, external ear and ear canal normal. Tympanic membrane is not erythematous, not retracted and not bulging.  Left Ear: Hearing, tympanic membrane, external ear and ear canal normal. Tympanic membrane is not erythematous, not retracted and not bulging.  Nose: No mucosal edema or rhinorrhea. Right sinus exhibits no maxillary sinus tenderness and no frontal sinus tenderness. Left sinus exhibits no maxillary sinus tenderness and no frontal sinus tenderness.  Mouth/Throat: Uvula is midline, oropharynx is clear and moist and mucous membranes are  normal.  Eyes: Conjunctivae, EOM and lids are normal. Pupils are equal, round, and reactive to light. Lids are everted and swept, no foreign bodies found.  Neck: Trachea normal and normal range of motion. Neck supple. Carotid bruit is not present. No mass and no thyromegaly present.  Cardiovascular: Normal rate, regular rhythm, S1 normal, S2 normal, normal heart sounds, intact distal pulses and normal pulses.  Exam reveals no gallop and no friction rub.   No murmur heard. Pulmonary/Chest: Effort normal and breath sounds normal. Not tachypneic. No respiratory distress. She has no decreased breath sounds. She has no wheezes. She has no rhonchi. She has no rales.  Abdominal: Soft. Normal appearance and bowel sounds are normal. There is no tenderness.  Neurological: She is alert.  Skin: Skin is warm, dry and intact. No rash noted.  Psychiatric: Her speech is normal and behavior is normal. Judgment and thought content normal. Her mood appears not anxious. Cognition and memory are normal. She does not exhibit a depressed mood.          Assessment & Plan:

## 2013-06-05 ENCOUNTER — Ambulatory Visit: Payer: Federal, State, Local not specified - PPO | Admitting: Family Medicine

## 2013-06-09 ENCOUNTER — Ambulatory Visit (INDEPENDENT_AMBULATORY_CARE_PROVIDER_SITE_OTHER): Payer: Federal, State, Local not specified - PPO | Admitting: Family Medicine

## 2013-06-09 ENCOUNTER — Encounter: Payer: Self-pay | Admitting: Family Medicine

## 2013-06-09 VITALS — BP 150/74 | HR 70 | Temp 98.1°F | Ht 66.0 in | Wt 227.8 lb

## 2013-06-09 DIAGNOSIS — I10 Essential (primary) hypertension: Secondary | ICD-10-CM

## 2013-06-09 MED ORDER — LOSARTAN POTASSIUM-HCTZ 100-25 MG PO TABS: 1.0000 | ORAL_TABLET | Freq: Every day | ORAL | Status: DC

## 2013-06-09 NOTE — Progress Notes (Signed)
Pre-visit discussion using our clinic review tool. No additional management support is needed unless otherwise documented below in the visit note.  

## 2013-06-09 NOTE — Patient Instructions (Addendum)
Stop at lab on way out. Increase losartan/HCTZ 100/25mg . Follow BPs at home.. Goal <140/90. Schedule follow up HTN in 2 weeks, creatinine.

## 2013-06-09 NOTE — Assessment & Plan Note (Signed)
Increase losartan/HCTZ. Eval creatinine.

## 2013-06-09 NOTE — Progress Notes (Signed)
   Subjective:    Patient ID: Kristine [name], female    DOB: 08-31-1940, 73 y.o.   MRN: [id number]  HPI  Hypertension:  Improved but not at goal on losartan HCTZ.    No known SE to this medicaiton. Using medication without problems or lightheadedness: None Chest pain with exertion:None Edema:None Short of breath:None Average home BPs: She has not been checking at home. Other issues: BP Readings from Last 3 Encounters:  06/09/13 150/74  05/15/13 150/80  04/16/13 140/72       Review of Systems  Constitutional: Negative for fever and fatigue.  HENT: Negative for ear pain.   Eyes: Negative for pain.  Respiratory: Negative for chest tightness and shortness of breath.   Cardiovascular: Negative for chest pain, palpitations and leg swelling.  Gastrointestinal: Negative for abdominal pain.  Genitourinary: Negative for dysuria.       Objective:   Physical Exam  Constitutional: Vital signs are normal. She appears well-developed and well-nourished. She is cooperative.  Non-toxic appearance. She does not appear ill. No distress.  HENT:  Head: Normocephalic.  Right Ear: Hearing, tympanic membrane, external ear and ear canal normal. Tympanic membrane is not erythematous, not retracted and not bulging.  Left Ear: Hearing, tympanic membrane, external ear and ear canal normal. Tympanic membrane is not erythematous, not retracted and not bulging.  Nose: No mucosal edema or rhinorrhea. Right sinus exhibits no maxillary sinus tenderness and no frontal sinus tenderness. Left sinus exhibits no maxillary sinus tenderness and no frontal sinus tenderness.  Mouth/Throat: Uvula is midline, oropharynx is clear and moist and mucous membranes are normal.  Eyes: Conjunctivae, EOM and lids are normal. Pupils are equal, round, and reactive to light. Lids are everted and swept, no foreign bodies found.  Neck: Trachea normal and normal range of motion. Neck supple. Carotid bruit is not present. No mass and  no thyromegaly present.  Cardiovascular: Normal rate, regular rhythm, S1 normal, S2 normal, normal heart sounds, intact distal pulses and normal pulses.  Exam reveals no gallop and no friction rub.   No murmur heard. Pulmonary/Chest: Effort normal and breath sounds normal. Not tachypneic. No respiratory distress. She has no decreased breath sounds. She has no wheezes. She has no rhonchi. She has no rales.  Abdominal: Soft. Normal appearance and bowel sounds are normal. There is no tenderness.  Neurological: She is alert.  Skin: Skin is warm, dry and intact. No rash noted.  Psychiatric: Her speech is normal and behavior is normal. Judgment and thought content normal. Her mood appears not anxious. Cognition and memory are normal. She does not exhibit a depressed mood.          Assessment & Plan:

## 2013-06-10 ENCOUNTER — Telehealth: Payer: Self-pay | Admitting: Family Medicine

## 2013-06-10 LAB — BASIC METABOLIC PANEL
BUN: 20 mg/dL (ref 6–23)
CHLORIDE: 106 meq/L (ref 96–112)
CO2: 26 mEq/L (ref 19–32)
Calcium: 8.8 mg/dL (ref 8.4–10.5)
Creatinine, Ser: 0.7 mg/dL (ref 0.4–1.2)
GFR: 85.67 mL/min (ref >60.00)
GLUCOSE: 90 mg/dL (ref 70–99)
Potassium: 3.9 mEq/L (ref 3.5–5.1)
Sodium: 138 mEq/L (ref 135–145)

## 2013-06-10 NOTE — Telephone Encounter (Signed)
Relevant patient education assigned to patient using Emmi. ° °

## 2013-06-15 ENCOUNTER — Ambulatory Visit
Admission: RE | Admit: 2013-06-15 | Discharge: 2013-06-15 | Disposition: A | Discharge status: Home / Self Care | Payer: Self-pay | Source: Ambulatory Visit

## 2013-06-15 DIAGNOSIS — Z1231 Encounter for screening mammogram for malignant neoplasm of breast: Secondary | ICD-10-CM

## 2013-06-25 ENCOUNTER — Encounter: Payer: Self-pay | Admitting: Family Medicine

## 2013-06-25 ENCOUNTER — Ambulatory Visit (INDEPENDENT_AMBULATORY_CARE_PROVIDER_SITE_OTHER): Payer: Federal, State, Local not specified - PPO | Admitting: Family Medicine

## 2013-06-25 VITALS — BP 151/79 | HR 69 | Temp 97.8°F | Ht 66.0 in | Wt 229.5 lb

## 2013-06-25 DIAGNOSIS — I10 Essential (primary) hypertension: Secondary | ICD-10-CM

## 2013-06-25 DIAGNOSIS — E039 Hypothyroidism, unspecified: Secondary | ICD-10-CM

## 2013-06-25 LAB — BASIC METABOLIC PANEL
BUN: 17 mg/dL (ref 6–23)
CALCIUM: 9 mg/dL (ref 8.4–10.5)
CO2: 28 meq/L (ref 19–32)
CREATININE: 0.6 mg/dL (ref 0.4–1.2)
Chloride: 105 mEq/L (ref 96–112)
GFR: 98.33 mL/min (ref >60.00)
GLUCOSE: 91 mg/dL (ref 70–99)
Potassium: 4.1 mEq/L (ref 3.5–5.1)
Sodium: 139 mEq/L (ref 135–145)

## 2013-06-25 MED ORDER — LEVOTHYROXINE SODIUM 75 MCG PO TABS: ORAL_TABLET | ORAL | Status: DC

## 2013-06-25 NOTE — Progress Notes (Signed)
Pre-visit discussion using our clinic review tool. No additional management support is needed unless otherwise documented below in the visit note.  

## 2013-06-25 NOTE — Assessment & Plan Note (Signed)
Trend of thyroid up may be partial reason for recent BP increase. Will increase slightly and recheck TSH in 4 weeks.

## 2013-06-25 NOTE — Progress Notes (Signed)
73 year old female returns for follow up HTN.  Hypertension: Still not at goal on MAX losartan HCTZ.  No known SE to this medicaiton.  Using medication without problems or lightheadedness: None  Chest pain with exertion:None  Edema:None  Short of breath:None  Average home BPs: New cuff 138-146/78, HR 66 Other issues:  Our cuff today 146/72 BP Readings from Last 3 Encounters:  06/25/13 151/79  06/09/13 150/74  05/15/13 150/80  No specific episodes of flushing suggesting pheochromacytoma.. She does use premarin for hot flashes  TSH has trended up in last year, but still in nml range.  Review of Systems  Constitutional: Negative for fever and mild decrease in fatigue.  HENT: Negative for ear pain.  Eyes: Negative for pain.  Respiratory: Negative for chest tightness and shortness of breath.  Cardiovascular: Negative for chest pain, palpitations and leg swelling.  Gastrointestinal: Negative for abdominal pain.  Genitourinary: Negative for dysuria.  Objective:   Physical Exam  Constitutional: Vital signs are normal. She appears well-developed and well-nourished. She is cooperative. Non-toxic appearance. She does not appear ill. No distress.  HENT:  Head: Normocephalic.  Right Ear: Hearing, tympanic membrane, external ear and ear canal normal. Tympanic membrane is not erythematous, not retracted and not bulging.  Left Ear: Hearing, tympanic membrane, external ear and ear canal normal. Tympanic membrane is not erythematous, not retracted and not bulging.  Nose: No mucosal edema or rhinorrhea. Right sinus exhibits no maxillary sinus tenderness and no frontal sinus tenderness. Left sinus exhibits no maxillary sinus tenderness and no frontal sinus tenderness.  Mouth/Throat: Uvula is midline, oropharynx is clear and moist and mucous membranes are normal.  Eyes: Conjunctivae, EOM and lids are normal. Pupils are equal, round, and reactive to light. Lids are everted and swept, no foreign bodies  found.  Neck: Trachea normal and normal range of motion. Neck supple. Carotid bruit is not present. No mass and no thyromegaly present.  Cardiovascular: Normal rate, regular rhythm, S1 normal, S2 normal, normal heart sounds, intact distal pulses and normal pulses. Exam reveals no gallop and no friction rub.  No murmur heard.  Pulmonary/Chest: Effort normal and breath sounds normal. Not tachypneic. No respiratory distress. She has no decreased breath sounds. She has no wheezes. She has no rhonchi. She has no rales.  Abdominal: Soft. Normal appearance and bowel sounds are normal. There is no tenderness.  Neurological: She is alert.  Skin: Skin is warm, dry and intact. No rash noted.  Psychiatric: Her speech is normal and behavior is normal. Judgment and thought content normal. Her mood appears not anxious. Cognition and memory are normal. She does not exhibit a depressed mood.

## 2013-06-25 NOTE — Patient Instructions (Signed)
Continue losartan HCTZ daily. Increase levothyroxine to 75 mcg daily. Stop at lab on way out for creatinine. Follow up in 4 weeks for HTN and thyroid check.

## 2013-06-25 NOTE — Assessment & Plan Note (Signed)
Inadequate control. Encouraged exercise, weight loss, healthy eating habits.  Change thyroid med.  Follow up BP in 4 weeks.

## 2013-07-23 ENCOUNTER — Ambulatory Visit: Payer: Federal, State, Local not specified - PPO | Admitting: Family Medicine

## 2013-07-30 ENCOUNTER — Ambulatory Visit (INDEPENDENT_AMBULATORY_CARE_PROVIDER_SITE_OTHER): Payer: Federal, State, Local not specified - PPO | Admitting: Family Medicine

## 2013-07-30 ENCOUNTER — Encounter: Payer: Self-pay | Admitting: Family Medicine

## 2013-07-30 VITALS — BP 130/74 | HR 75 | Temp 97.9°F | Ht 66.0 in | Wt 229.5 lb

## 2013-07-30 DIAGNOSIS — I10 Essential (primary) hypertension: Secondary | ICD-10-CM

## 2013-07-30 DIAGNOSIS — E039 Hypothyroidism, unspecified: Secondary | ICD-10-CM

## 2013-07-30 LAB — TSH: TSH: 1.89 u[IU]/mL (ref 0.35–5.50)

## 2013-07-30 NOTE — Assessment & Plan Note (Signed)
Due for re-eval. 

## 2013-07-30 NOTE — Patient Instructions (Addendum)
We will call with thyroid results. Call if headache is persistent. Continue to follow BP at home on current medication.

## 2013-07-30 NOTE — Progress Notes (Signed)
Pre visit review using our clinic review tool, if applicable. No additional management support is needed unless otherwise documented below in the visit note. 

## 2013-07-30 NOTE — Progress Notes (Signed)
73 year old female returns for follow up HTN.   Hypertension: Improved controled on  losartan HCTZ and now with slight increase in thyroid medication. No known SE to this medicaiton.  Using medication without problems or lightheadedness: None  Chest pain with exertion:None  Edema:None  Short of breath:None  Average home BPs: 130/70s occ 150/80s  BP Readings from Last 3 Encounters:  07/30/13 130/74  06/25/13 151/79  06/09/13 150/74  No specific episodes of flushing suggesting pheochromacytoma.. She does use premarin for hot flashes   TSH has trended up in last year, but still in nml range. Lab Results  Component Value Date   TSH 4.71 04/14/2013  Due for re-eval on higher dose med 75 mcg daily.    Review of Systems  Constitutional: Negative for fever and mild decrease in fatigue.  HENT: Negative for ear pain.  occ dull headache Eyes: Negative for pain.  Respiratory: Negative for chest tightness and shortness of breath.  Cardiovascular: Negative for chest pain, palpitations and leg swelling.  Gastrointestinal: Negative for abdominal pain.  Genitourinary: Negative for dysuria.  Objective:   Physical Exam  Constitutional: Vital signs are normal. She appears well-developed and well-nourished. She is cooperative. Non-toxic appearance. She does not appear ill. No distress.  HENT:  Head: Normocephalic.  Right Ear: Hearing, tympanic membrane, external ear and ear canal normal. Tympanic membrane is not erythematous, not retracted and not bulging.  Left Ear: Hearing, tympanic membrane, external ear and ear canal normal. Tympanic membrane is not erythematous, not retracted and not bulging.  Nose: No mucosal edema or rhinorrhea. Right sinus exhibits no maxillary sinus tenderness and no frontal sinus tenderness. Left sinus exhibits no maxillary sinus tenderness and no frontal sinus tenderness.  Mouth/Throat: Uvula is midline, oropharynx is clear and moist and mucous membranes are normal.   Eyes: Conjunctivae, EOM and lids are normal. Pupils are equal, round, and reactive to light. Lids are everted and swept, no foreign bodies found.  Neck: Trachea normal and normal range of motion. Neck supple. Carotid bruit is not present. No mass and no thyromegaly present.  Cardiovascular: Normal rate, regular rhythm, S1 normal, S2 normal, normal heart sounds, intact distal pulses and normal pulses. Exam reveals no gallop and no friction rub.  No murmur heard.  Pulmonary/Chest: Effort normal and breath sounds normal. Not tachypneic. No respiratory distress. She has no decreased breath sounds. She has no wheezes. She has no rhonchi. She has no rales.  Abdominal: Soft. Normal appearance and bowel sounds are normal. There is no tenderness.  Neurological: She is alert.  Skin: Skin is warm, dry and intact. No rash noted.  Psychiatric: Her speech is normal and behavior is normal. Judgment and thought content normal. Her mood appears not anxious. Cognition and memory are normal. She does not exhibit a depressed mood.

## 2013-07-30 NOTE — Assessment & Plan Note (Signed)
Improved control on current meds. 

## 2013-10-16 ENCOUNTER — Other Ambulatory Visit: Payer: Federal, State, Local not specified - PPO

## 2013-10-20 ENCOUNTER — Ambulatory Visit: Payer: Federal, State, Local not specified - PPO | Admitting: Family Medicine

## 2013-11-02 ENCOUNTER — Other Ambulatory Visit: Payer: Self-pay | Admitting: Family Medicine

## 2013-11-08 ENCOUNTER — Telehealth: Payer: Self-pay | Admitting: Family Medicine

## 2013-11-08 DIAGNOSIS — E7800 Pure hypercholesterolemia, unspecified: Secondary | ICD-10-CM

## 2013-11-08 NOTE — Telephone Encounter (Signed)
Message copied by Jinny Sanders on Sun Nov 08, 2013 11:37 PM ------      Message from: Ellamae Sia      Created: Thu Nov 05, 2013 12:42 PM      Regarding: Lab orders for Monday, 6.15.15       Lab orders for 6 month f/u ------

## 2013-11-09 ENCOUNTER — Other Ambulatory Visit (INDEPENDENT_AMBULATORY_CARE_PROVIDER_SITE_OTHER): Payer: Federal, State, Local not specified - PPO

## 2013-11-09 DIAGNOSIS — E7800 Pure hypercholesterolemia, unspecified: Secondary | ICD-10-CM

## 2013-11-09 LAB — LIPID PANEL
CHOL/HDL RATIO: 3
Cholesterol: 165 mg/dL (ref 0–200)
HDL: 64.8 mg/dL (ref >39.00)
LDL CALC: 72 mg/dL (ref 0–99)
NONHDL: 100.2
Triglycerides: 141 mg/dL (ref 0.0–149.0)
VLDL: 28.2 mg/dL (ref 0.0–40.0)

## 2013-11-09 LAB — COMPREHENSIVE METABOLIC PANEL
ALT: 18 U/L (ref 0–35)
AST: 19 U/L (ref 0–37)
Albumin: 3.7 g/dL (ref 3.5–5.2)
Alkaline Phosphatase: 40 U/L (ref 39–117)
BILIRUBIN TOTAL: 0.5 mg/dL (ref 0.2–1.2)
BUN: 21 mg/dL (ref 6–23)
CO2: 26 mEq/L (ref 19–32)
Calcium: 9 mg/dL (ref 8.4–10.5)
Chloride: 104 mEq/L (ref 96–112)
Creatinine, Ser: 0.7 mg/dL (ref 0.4–1.2)
GFR: 88.44 mL/min (ref >60.00)
Glucose, Bld: 101 mg/dL — ABNORMAL HIGH (ref 70–99)
Potassium: 3.7 mEq/L (ref 3.5–5.1)
SODIUM: 139 meq/L (ref 135–145)
TOTAL PROTEIN: 6.1 g/dL (ref 6.0–8.3)

## 2013-11-09 NOTE — Addendum Note (Signed)
Addended by: Ellamae Sia on: 11/09/2013 02:04 PM   Modules accepted: Orders

## 2013-11-12 ENCOUNTER — Ambulatory Visit (INDEPENDENT_AMBULATORY_CARE_PROVIDER_SITE_OTHER): Payer: Federal, State, Local not specified - PPO | Admitting: Family Medicine

## 2013-11-12 ENCOUNTER — Encounter: Payer: Self-pay | Admitting: Family Medicine

## 2013-11-12 VITALS — BP 128/60 | HR 68 | Temp 98.2°F | Wt 227.0 lb

## 2013-11-12 DIAGNOSIS — E7800 Pure hypercholesterolemia, unspecified: Secondary | ICD-10-CM

## 2013-11-12 DIAGNOSIS — I10 Essential (primary) hypertension: Secondary | ICD-10-CM

## 2013-11-12 MED ORDER — LEVOTHYROXINE SODIUM 75 MCG PO TABS: 75.0000 ug | ORAL_TABLET | Freq: Every day | ORAL | Status: DC

## 2013-11-12 NOTE — Progress Notes (Addendum)
73 year old female returns for 6 month follow up.  Hypertension: Improved controled on losartan/HCTZ  No known SE to this medicaiton.  Using medication without problems or lightheadedness: None  Chest pain with exertion:None  Edema:None  Short of breath:None  Average home BPs: not checking at home.  BP Readings from Last 3 Encounters:  11/12/13 128/60  07/30/13 130/74  06/25/13 151/79    Elevated Cholesterol: LDL at goal on simvastatin.  Trig better. Lab Results  Component Value Date   CHOL 165 11/09/2013   HDL 64.80 11/09/2013   LDLCALC 72 11/09/2013   LDLDIRECT 110.3 03/17/2009   TRIG 141.0 11/09/2013   CHOLHDL 3 11/09/2013  Using medications without problems: none Muscle aches: None Diet compliance: Healthy diet, low fat. Fish oil and oatmeal. Exercise: Golf. Other complaints:  Review of Systems  Constitutional: Negative for fever and mild decrease in fatigue.  HENT: Negative for ear pain. occ dull headache  Eyes: Negative for pain.  Respiratory: Negative for chest tightness and shortness of breath.  Cardiovascular: Negative for chest pain, palpitations and leg swelling.  Gastrointestinal: Negative for abdominal pain.  Genitourinary: Negative for dysuria.  Objective:   Physical Exam  Constitutional: Vital signs are normal. She appears well-developed and well-nourished. She is cooperative. Non-toxic appearance. She does not appear ill. No distress.  HENT:  Head: Normocephalic.  Right Ear: Hearing, tympanic membrane, external ear and ear canal normal. Tympanic membrane is not erythematous, not retracted and not bulging.  Left Ear: Hearing, tympanic membrane, external ear and ear canal normal. Tympanic membrane is not erythematous, not retracted and not bulging.  Nose: No mucosal edema or rhinorrhea. Right sinus exhibits no maxillary sinus tenderness and no frontal sinus tenderness. Left sinus exhibits no maxillary sinus tenderness and no frontal sinus tenderness.   Mouth/Throat: Uvula is midline, oropharynx is clear and moist and mucous membranes are normal.  Eyes: Conjunctivae, EOM and lids are normal. Pupils are equal, round, and reactive to light. Lids are everted and swept, no foreign bodies found.  Neck: Trachea normal and normal range of motion. Neck supple. Carotid bruit is not present. No mass and no thyromegaly present.  Cardiovascular: Normal rate, regular rhythm, S1 normal, S2 normal, normal heart sounds, intact distal pulses and normal pulses. Exam reveals no gallop and no friction rub.  No murmur heard.  Pulmonary/Chest: Effort normal and breath sounds normal. Not tachypneic. No respiratory distress. She has no decreased breath sounds. She has no wheezes. She has no rhonchi. She has no rales.  Abdominal: Soft. Normal appearance and bowel sounds are normal. There is no tenderness.  Neurological: She is alert.  Skin: Skin is warm, dry and intact. No rash noted.  Psychiatric: Her speech is normal and behavior is normal. Judgment and thought content normal. Her mood appears not anxious. Cognition and memory are normal. She does not exhibit a depressed mood.

## 2013-11-12 NOTE — Progress Notes (Signed)
Pre visit review using our clinic review tool, if applicable. No additional management support is needed unless otherwise documented below in the visit note. 

## 2013-11-12 NOTE — Patient Instructions (Addendum)
Schedule exam after 11/20. Work on [employer], exercise and weight loss.

## 2013-11-24 NOTE — Assessment & Plan Note (Signed)
Well controlled. Continue current medication.  

## 2013-11-24 NOTE — Assessment & Plan Note (Signed)
Well controlled. Continue current medication. Encouraged exercise, weight loss, healthy eating habits.  

## 2014-04-08 ENCOUNTER — Other Ambulatory Visit: Payer: Self-pay | Admitting: Family Medicine

## 2014-04-29 ENCOUNTER — Other Ambulatory Visit: Payer: Self-pay | Admitting: Family Medicine

## 2014-04-29 NOTE — Telephone Encounter (Signed)
Last office visit 11/12/2013.  Last refilled 04/16/2013 for #180 with 3 refills.  Ok to refill?

## 2014-05-03 ENCOUNTER — Other Ambulatory Visit (INDEPENDENT_AMBULATORY_CARE_PROVIDER_SITE_OTHER): Payer: Federal, State, Local not specified - PPO

## 2014-05-03 ENCOUNTER — Telehealth: Payer: Self-pay | Admitting: Family Medicine

## 2014-05-03 DIAGNOSIS — E039 Hypothyroidism, unspecified: Secondary | ICD-10-CM

## 2014-05-03 DIAGNOSIS — E7800 Pure hypercholesterolemia, unspecified: Secondary | ICD-10-CM

## 2014-05-03 LAB — COMPREHENSIVE METABOLIC PANEL
ALK PHOS: 47 U/L (ref 39–117)
ALT: 16 U/L (ref 0–35)
AST: 18 U/L (ref 0–37)
Albumin: 3.6 g/dL (ref 3.5–5.2)
BUN: 22 mg/dL (ref 6–23)
CHLORIDE: 107 meq/L (ref 96–112)
CO2: 26 mEq/L (ref 19–32)
Calcium: 9 mg/dL (ref 8.4–10.5)
Creatinine, Ser: 0.7 mg/dL (ref 0.4–1.2)
GFR: 91.37 mL/min (ref >60.00)
GLUCOSE: 97 mg/dL (ref 70–99)
POTASSIUM: 3.9 meq/L (ref 3.5–5.1)
SODIUM: 140 meq/L (ref 135–145)
TOTAL PROTEIN: 6 g/dL (ref 6.0–8.3)
Total Bilirubin: 0.7 mg/dL (ref 0.2–1.2)

## 2014-05-03 LAB — TSH: TSH: 4.33 u[IU]/mL (ref 0.35–4.50)

## 2014-05-03 LAB — LIPID PANEL
Cholesterol: 174 mg/dL (ref 0–200)
HDL: 61.4 mg/dL
LDL Cholesterol: 79 mg/dL (ref 0–99)
NonHDL: 112.6
Total CHOL/HDL Ratio: 3
Triglycerides: 166 mg/dL — ABNORMAL HIGH (ref 0.0–149.0)
VLDL: 33.2 mg/dL (ref 0.0–40.0)

## 2014-05-03 NOTE — Telephone Encounter (Signed)
-----   Message from [place] sent at 04/26/2014  3:55 PM EST ----- Regarding: Cpx labs Mon 05/03/14, need orders. Please order  future cpx labs for pt's upcoming lab appt. Thanks Aniceto Boss

## 2014-05-07 ENCOUNTER — Other Ambulatory Visit: Payer: Self-pay | Admitting: Family Medicine

## 2014-05-07 ENCOUNTER — Encounter: Payer: Self-pay | Admitting: Family Medicine

## 2014-05-07 ENCOUNTER — Ambulatory Visit (INDEPENDENT_AMBULATORY_CARE_PROVIDER_SITE_OTHER): Payer: Federal, State, Local not specified - PPO | Admitting: Family Medicine

## 2014-05-07 VITALS — BP 124/66 | HR 70 | Temp 98.2°F | Ht 66.0 in | Wt 229.5 lb

## 2014-05-07 DIAGNOSIS — Z23 Encounter for immunization: Secondary | ICD-10-CM

## 2014-05-07 DIAGNOSIS — Z0000 Encounter for general adult medical examination without abnormal findings: Secondary | ICD-10-CM

## 2014-05-07 DIAGNOSIS — M7918 Myalgia, other site: Secondary | ICD-10-CM | POA: Insufficient documentation

## 2014-05-07 DIAGNOSIS — I10 Essential (primary) hypertension: Secondary | ICD-10-CM

## 2014-05-07 DIAGNOSIS — E039 Hypothyroidism, unspecified: Secondary | ICD-10-CM

## 2014-05-07 DIAGNOSIS — E7800 Pure hypercholesterolemia, unspecified: Secondary | ICD-10-CM

## 2014-05-07 DIAGNOSIS — R29898 Other symptoms and signs involving the musculoskeletal system: Secondary | ICD-10-CM

## 2014-05-07 MED ORDER — LOSARTAN POTASSIUM-HCTZ 100-25 MG PO TABS
1.0000 | ORAL_TABLET | Freq: Every day | ORAL | Status: DC
Start: 2014-05-07 — End: 2014-11-09

## 2014-05-07 MED ORDER — PREDNISONE 20 MG PO TABS: ORAL_TABLET | ORAL | Status: DC

## 2014-05-07 MED ORDER — MONTELUKAST SODIUM 10 MG PO TABS: 10.0000 mg | ORAL_TABLET | Freq: Every day | ORAL | Status: DC

## 2014-05-07 MED ORDER — ESTROGENS CONJUGATED 0.625 MG PO TABS: 0.6250 mg | ORAL_TABLET | Freq: Every day | ORAL | Status: DC

## 2014-05-07 NOTE — Assessment & Plan Note (Signed)
Well controlled. Continue current medication.  

## 2014-05-07 NOTE — Assessment & Plan Note (Signed)
Nml on exam but pt endorses difficulty standing from squat. Work on home PT. Consider further eval if not improving.

## 2014-05-07 NOTE — Progress Notes (Signed)
Pre visit review using our clinic review tool, if applicable. No additional management support is needed unless otherwise documented below in the visit note. 

## 2014-05-07 NOTE — Assessment & Plan Note (Signed)
Trending up again, recheck in 3 months.

## 2014-05-07 NOTE — Progress Notes (Signed)
The patient is here for annual wellness exam and preventative care.  Does not have medicare.  She has had several months of ttp in right buttock, some low back pain as well.  She sees Dr. Raphael Gibney for right hip pain ( this feels different). Not at point needs hipo surgery yet. 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 Pain in right buttock with walking, improves with walking 3/4 mil at golf. No numbness, she does have B LExt  weakness. ( HAs had for years, some issue going from squating position. She limps some. Usually does not radiate, one time she did have pain in right lower leg.  Has used sulindac, does not help with pain. Occ also uses ibuprofen.  Elevated Cholesterol: At goal LDL <130 on zocor 40 mg daily, trig high  Lab Results  Component Value Date   CHOL 174 05/03/2014   HDL 61.40 05/03/2014   LDLCALC 79 05/03/2014   LDLDIRECT 110.3 03/17/2009   TRIG 166.0* 05/03/2014   CHOLHDL 3 05/03/2014  Using medications without problems: None  Muscle aches: None  Diet compliance:Good  Exercise: Minimal, some walking.  Other complaints:   Hypertension: Well controlled on HCTZ, slightly elevated today. BP Readings from Last 3 Encounters:  05/07/14 124/66  11/12/13 128/60  07/30/13 130/74  Using medication without problems or lightheadedness:  Chest pain with exertion:None  Edema:None  Short of breath:None Average home BPs: Not checking.  Other issues:   Low thyroid: Stable on current med dose.  She does have some fatigue. Wants to check it again in 3 months. Lab Results  Component Value Date   TSH 4.33 05/03/2014     Review of Systems  Constitutional: Negative for fever, fatigue and unexpected weight change.  HENT: Negative for ear pain, congestion, sore throat, sneezing, trouble swallowing and sinus pressure.  Eyes: Negative for pain and itching.  Respiratory: Negative for cough, shortness of breath and wheezing.  Cardiovascular: Negative for chest pain, palpitations and  leg swelling.  Gastrointestinal: Negative for nausea, abdominal pain, diarrhea, constipation and blood in stool.  Genitourinary: Negative for dysuria, hematuria, vaginal discharge, difficulty urinating and menstrual problem.  Skin: Negative for rash.  Neurological: Negative for syncope, weakness, light-headedness, numbness and headaches.  Psychiatric/Behavioral: Negative for confusion and dysphoric mood. The patient is not nervous/anxious.  Objective:   Physical Exam  Physical Exam  Constitutional: Vital signs are normal. She appears well-developed and well-nourished. She is cooperative. Non-toxic appearance. She does not appear ill. No distress.  overweight  HENT:  Head: Normocephalic.  Right Ear: Hearing, tympanic membrane, external ear and ear canal normal.  Left Ear: Hearing, tympanic membrane, external ear and ear canal normal.  Nose: Nose normal.  Eyes: Conjunctivae, EOM and lids are normal. Pupils are equal, round, and reactive to light. Lids are everted and swept, no foreign bodies found.  Neck: Trachea normal and normal range of motion. Neck supple. Carotid bruit is not present. No mass and no thyromegaly present.  Cardiovascular: Normal rate, regular rhythm, S1 normal, S2 normal, normal heart sounds and intact distal pulses. Exam reveals no gallop.  No murmur heard. Pulmonary/Chest: Effort normal and breath sounds normal. No respiratory distress. She has no wheezes. She has no rhonchi. She has no rales.  Abdominal: Soft. Normal appearance and bowel sounds are normal. She exhibits no distension, no fluid wave, no abdominal bruit and no mass. There is no hepatosplenomegaly. There is no tenderness. There is no rebound, no guarding and no CVA tenderness. No hernia.  Genitourinary: No breast swelling,  tenderness, discharge or bleeding. Pelvic exam was performed with patient prone.  Lymphadenopathy:   She has no cervical adenopathy.   She has no axillary adenopathy.   Neurological: She is alert. She has normal strength. No cranial nerve deficit or sensory deficit.  Skin: Skin is warm, dry and intact. No rash noted.  Psychiatric: Her speech is normal and behavior is normal. Judgment normal. Her mood appears not anxious. Cognition and memory are normal. She does not exhibit a depressed mood.   MSK: ttp in right lower back and right sciatic notch, nml B lext strength.  Assessment & Plan:   Complete Physical Exam: The patient's preventative maintenance and recommended screening tests for an annual wellness exam were reviewed in full today.  Brought up to date unless services declined.  Counselled on the importance of diet, exercise, and its role in overall health and mortality.  The patient's FH and SH was reviewed, including their home life, tobacco status, and drug and alcohol status.   Vaccines: Uptodate Td, PNA,zoster; SE to Flu in past:refused.  Due for Prevnar. PAP/DVE: Pap/DVE not indicated  Mammogram Nml 05/2013 DEXA:nml 05/2010, repeat in 5 years  Colon: nml 2009, Dr. Olevia Perches, repeat in 10 years: 2019. Nonsmoker

## 2014-05-07 NOTE — Patient Instructions (Addendum)
Set up lab only visit in 3 months to check TSH. Call to schedule mammogram on your own.  Start home low back physical therapy, work on strengthening lower legs. Work on healthy eating and exercise as tolerated.

## 2014-05-07 NOTE — Addendum Note (Signed)
Addended by: [name] on: 05/07/2014 03:10 PM   Modules accepted: Orders

## 2014-05-07 NOTE — Addendum Note (Signed)
Addended by: [name] on: 05/07/2014 03:16 PM   Modules accepted: Orders

## 2014-05-07 NOTE — Assessment & Plan Note (Signed)
Most likely due to low back pain/sciatica.  Treat with PT and prednisone taper.

## 2014-06-15 ENCOUNTER — Telehealth: Payer: Self-pay

## 2014-06-15 NOTE — Telephone Encounter (Signed)
Pt left v/m; pt received premarin from [facility] which was #63 with instructions take 21 days then off for 7 days. Pt request cb about instructions and quantity. Pt wants to know if that is how Dr Diona Browner wants pt to take med; pt request cb.

## 2014-06-16 NOTE — Telephone Encounter (Signed)
Looks like this may have been an error. I do not see anything in the 12/11 note talking about premarin.

## 2014-06-17 NOTE — Telephone Encounter (Signed)
Looks like patient has been taking the premarin one tablet every day.  This was refilled at her physical with the directions to take 21 days then off for 7.  Should this have been prescribed as a daily dose??

## 2014-06-20 NOTE — Telephone Encounter (Signed)
Yes, I don't think the change was intentional. Please refill for 1 year at the previous dose and sig. Thanks.

## 2014-06-21 MED ORDER — ESTROGENS CONJUGATED 0.625 MG PO TABS: 0.6250 mg | ORAL_TABLET | Freq: Every day | ORAL | Status: DC

## 2014-06-21 NOTE — Telephone Encounter (Signed)
Kristine [name] notified we are unsure why or how the instructions on her premarin was changed.  She should be taking it one tablet every day.  New prescription sent in to [facility].

## 2014-06-21 NOTE — Telephone Encounter (Signed)
Left message for Kristine [name] to return my call.

## 2014-07-13 ENCOUNTER — Other Ambulatory Visit: Payer: Self-pay | Admitting: Family Medicine

## 2014-07-22 ENCOUNTER — Ambulatory Visit (INDEPENDENT_AMBULATORY_CARE_PROVIDER_SITE_OTHER)
Admission: RE | Admit: 2014-07-22 | Discharge: 2014-07-22 | Disposition: A | Discharge status: Home / Self Care | Payer: Federal, State, Local not specified - PPO | Source: Ambulatory Visit | Attending: Family Medicine | Admitting: Family Medicine

## 2014-07-22 ENCOUNTER — Ambulatory Visit (INDEPENDENT_AMBULATORY_CARE_PROVIDER_SITE_OTHER): Payer: Federal, State, Local not specified - PPO | Admitting: Family Medicine

## 2014-07-22 ENCOUNTER — Encounter: Payer: Self-pay | Admitting: Family Medicine

## 2014-07-22 VITALS — BP 126/78 | HR 89 | Temp 98.3°F | Ht 66.0 in | Wt 231.2 lb

## 2014-07-22 DIAGNOSIS — R05 Cough: Secondary | ICD-10-CM | POA: Insufficient documentation

## 2014-07-22 DIAGNOSIS — R059 Cough, unspecified: Secondary | ICD-10-CM | POA: Insufficient documentation

## 2014-07-22 DIAGNOSIS — R918 Other nonspecific abnormal finding of lung field: Secondary | ICD-10-CM

## 2014-07-22 MED ORDER — AZITHROMYCIN 250 MG PO TABS: ORAL_TABLET | ORAL | Status: DC

## 2014-07-22 MED ORDER — GUAIFENESIN-CODEINE 100-10 MG/5ML PO SYRP: 5.0000 mL | ORAL_SOLUTION | Freq: Every evening | ORAL | Status: DC | PRN

## 2014-07-22 NOTE — Progress Notes (Signed)
   Subjective:    Patient ID: Kristine [name], female    DOB: [birth year], 74 y.o.   MRN: [id number]  Cough This is a new problem. The current episode started in the past 7 days. The problem has been gradually worsening. The cough is non-productive. Associated symptoms include nasal congestion, postnasal drip and shortness of breath. Pertinent negatives include no chills, ear congestion, ear pain, fever, headaches, myalgias or wheezing. Associated symptoms comments: Chest tightness and crackling in lungs when at rest. The symptoms are aggravated by lying down. Risk factors: nonsmoker. She has tried OTC cough suppressant for the symptoms. The treatment provided mild relief. There is no history of asthma, bronchiectasis, bronchitis, COPD, emphysema, environmental allergies or pneumonia.    BP Readings from Last 3 Encounters:  07/22/14 126/78  05/07/14 124/66  11/12/13 128/60   No sick contacts.   Review of Systems  Constitutional: Negative for fever and chills.  HENT: Positive for postnasal drip. Negative for ear pain.   Respiratory: Positive for cough and shortness of breath. Negative for wheezing.   Musculoskeletal: Negative for myalgias.  Allergic/Immunologic: Negative for environmental allergies.  Neurological: Negative for headaches.       Objective:   Physical Exam  Constitutional: Vital signs are normal. She appears well-developed and well-nourished. She is cooperative.  Non-toxic appearance. She does not appear ill. No distress.  HENT:  Head: Normocephalic.  Right Ear: Hearing, tympanic membrane, external ear and ear canal normal. Tympanic membrane is not erythematous, not retracted and not bulging.  Left Ear: Hearing, tympanic membrane, external ear and ear canal normal. Tympanic membrane is not erythematous, not retracted and not bulging.  Nose: Mucosal edema and rhinorrhea present. Right sinus exhibits no maxillary sinus tenderness and no frontal sinus tenderness. Left sinus  exhibits no maxillary sinus tenderness and no frontal sinus tenderness.  Mouth/Throat: Uvula is midline, oropharynx is clear and moist and mucous membranes are normal.  Eyes: Conjunctivae, EOM and lids are normal. Pupils are equal, round, and reactive to light. Lids are everted and swept, no foreign bodies found.  Neck: Trachea normal and normal range of motion. Neck supple. Carotid bruit is not present. No thyroid mass and no thyromegaly present.  Cardiovascular: Normal rate, regular rhythm, S1 normal, S2 normal, normal heart sounds, intact distal pulses and normal pulses.  Exam reveals no gallop and no friction rub.   No murmur heard. Pulmonary/Chest: Effort normal. No tachypnea. No respiratory distress. She has no decreased breath sounds. She has no wheezes. She has rhonchi in the right middle field and the right lower field. She has no rales.  Neurological: She is alert.  Skin: Skin is warm, dry and intact. No rash noted.  Psychiatric: Her speech is normal and behavior is normal. Judgment normal. Her mood appears not anxious. Cognition and memory are normal. She does not exhibit a depressed mood.          Assessment & Plan:

## 2014-07-22 NOTE — Progress Notes (Signed)
Pre visit review using our clinic review tool, if applicable. No additional management support is needed unless otherwise documented below in the visit note. 

## 2014-07-22 NOTE — Patient Instructions (Signed)
Res,t fluids. Mucinex DM for cough.  Complete course of antibitoics.  Call if not improving as expected.

## 2014-07-22 NOTE — Assessment & Plan Note (Signed)
CXR clear. Most likely acute bronchitis. Treat with azithromycin and cough suppressant.

## 2014-07-23 ENCOUNTER — Telehealth: Payer: Self-pay | Admitting: Family Medicine

## 2014-07-23 MED ORDER — BENZONATATE 200 MG PO CAPS: 200.0000 mg | ORAL_CAPSULE | Freq: Two times a day (BID) | ORAL | Status: DC | PRN

## 2014-07-23 NOTE — Telephone Encounter (Signed)
-----   Message from [name], [place] sent at 07/23/2014  9:51 AM EST ----- [name] notified as instructed by telephone.  Patient states she took to cough medicine last night and it did not seem to help. Wanting to know if Dr. Diona Browner thought Kristine [name] would help.  Please advise.

## 2014-07-23 NOTE — Telephone Encounter (Signed)
Mrs. Kenedy notified Ladona Ridgel prescription has been sent to her pharmacy.

## 2014-07-23 NOTE — Telephone Encounter (Signed)
Yes, will send them in

## 2014-08-06 ENCOUNTER — Other Ambulatory Visit (INDEPENDENT_AMBULATORY_CARE_PROVIDER_SITE_OTHER): Payer: Federal, State, Local not specified - PPO

## 2014-08-06 DIAGNOSIS — E039 Hypothyroidism, unspecified: Secondary | ICD-10-CM

## 2014-08-06 LAB — TSH: TSH: 2.69 u[IU]/mL (ref 0.35–4.50)

## 2014-09-21 ENCOUNTER — Other Ambulatory Visit: Payer: Self-pay

## 2014-09-21 DIAGNOSIS — Z1231 Encounter for screening mammogram for malignant neoplasm of breast: Secondary | ICD-10-CM

## 2014-09-29 ENCOUNTER — Ambulatory Visit
Admission: RE | Admit: 2014-09-29 | Discharge: 2014-09-29 | Disposition: A | Discharge status: Home / Self Care | Payer: Federal, State, Local not specified - PPO | Source: Ambulatory Visit

## 2014-09-29 DIAGNOSIS — Z1231 Encounter for screening mammogram for malignant neoplasm of breast: Secondary | ICD-10-CM

## 2014-10-29 ENCOUNTER — Telehealth: Payer: Self-pay | Admitting: Family Medicine

## 2014-10-29 DIAGNOSIS — E039 Hypothyroidism, unspecified: Secondary | ICD-10-CM

## 2014-10-29 DIAGNOSIS — D509 Iron deficiency anemia, unspecified: Secondary | ICD-10-CM

## 2014-10-29 DIAGNOSIS — E7800 Pure hypercholesterolemia, unspecified: Secondary | ICD-10-CM

## 2014-10-29 NOTE — Telephone Encounter (Signed)
-----   Message from Ellamae Sia sent at 10/27/2014  5:34 PM EDT ----- Regarding: Lab orders for Tuesday, 6.7.16 Lab orders for a 6 month f/u

## 2014-11-02 ENCOUNTER — Other Ambulatory Visit (INDEPENDENT_AMBULATORY_CARE_PROVIDER_SITE_OTHER): Payer: Federal, State, Local not specified - PPO

## 2014-11-02 DIAGNOSIS — E7800 Pure hypercholesterolemia, unspecified: Secondary | ICD-10-CM

## 2014-11-02 DIAGNOSIS — D509 Iron deficiency anemia, unspecified: Secondary | ICD-10-CM

## 2014-11-02 LAB — CBC WITH DIFFERENTIAL/PLATELET
Basophils Absolute: 0 10*3/uL (ref 0.0–0.1)
Basophils Relative: 0.5 % (ref 0.0–3.0)
Eosinophils Absolute: 0.2 10*3/uL (ref 0.0–0.7)
Eosinophils Relative: 4.6 % (ref 0.0–5.0)
HCT: 40.4 % (ref 36.0–46.0)
Hemoglobin: 13.6 g/dL (ref 12.0–15.0)
LYMPHS PCT: 30.4 % (ref 12.0–46.0)
Lymphs Abs: 1.6 10*3/uL (ref 0.7–4.0)
MCHC: 33.6 g/dL (ref 30.0–36.0)
MCV: 87.6 fl (ref 78.0–100.0)
MONOS PCT: 6.8 % (ref 3.0–12.0)
Monocytes Absolute: 0.4 10*3/uL (ref 0.1–1.0)
NEUTROS PCT: 57.7 % (ref 43.0–77.0)
Neutro Abs: 3 10*3/uL (ref 1.4–7.7)
Platelets: 206 10*3/uL (ref 150.0–400.0)
RBC: 4.62 Mil/uL (ref 3.87–5.11)
RDW: 13.1 % (ref 11.5–15.5)
WBC: 5.2 10*3/uL (ref 4.0–10.5)

## 2014-11-02 LAB — COMPREHENSIVE METABOLIC PANEL
ALK PHOS: 44 U/L (ref 39–117)
ALT: 14 U/L (ref 0–35)
AST: 13 U/L (ref 0–37)
Albumin: 3.8 g/dL (ref 3.5–5.2)
BILIRUBIN TOTAL: 0.6 mg/dL (ref 0.2–1.2)
BUN: 22 mg/dL (ref 6–23)
CALCIUM: 8.9 mg/dL (ref 8.4–10.5)
CHLORIDE: 106 meq/L (ref 96–112)
CO2: 26 mEq/L (ref 19–32)
CREATININE: 0.61 mg/dL (ref 0.40–1.20)
GFR: 101.68 mL/min (ref >60.00)
GLUCOSE: 98 mg/dL (ref 70–99)
Potassium: 4.1 mEq/L (ref 3.5–5.1)
SODIUM: 139 meq/L (ref 135–145)
TOTAL PROTEIN: 6.1 g/dL (ref 6.0–8.3)

## 2014-11-02 LAB — LIPID PANEL
CHOLESTEROL: 180 mg/dL (ref 0–200)
HDL: 57.3 mg/dL (ref >39.00)
LDL Cholesterol: 90 mg/dL (ref 0–99)
NonHDL: 122.7
Total CHOL/HDL Ratio: 3
Triglycerides: 164 mg/dL — ABNORMAL HIGH (ref 0.0–149.0)
VLDL: 32.8 mg/dL (ref 0.0–40.0)

## 2014-11-06 ENCOUNTER — Other Ambulatory Visit: Payer: Self-pay | Admitting: Family Medicine

## 2014-11-09 ENCOUNTER — Encounter: Payer: Self-pay | Admitting: Family Medicine

## 2014-11-09 ENCOUNTER — Ambulatory Visit (INDEPENDENT_AMBULATORY_CARE_PROVIDER_SITE_OTHER): Payer: Federal, State, Local not specified - PPO | Admitting: Family Medicine

## 2014-11-09 VITALS — BP 124/70 | HR 63 | Temp 98.3°F | Ht 66.0 in | Wt 230.5 lb

## 2014-11-09 DIAGNOSIS — D509 Iron deficiency anemia, unspecified: Secondary | ICD-10-CM

## 2014-11-09 DIAGNOSIS — E7800 Pure hypercholesterolemia, unspecified: Secondary | ICD-10-CM

## 2014-11-09 DIAGNOSIS — I10 Essential (primary) hypertension: Secondary | ICD-10-CM

## 2014-11-09 MED ORDER — LOSARTAN POTASSIUM-HCTZ 100-25 MG PO TABS: 1.0000 | ORAL_TABLET | Freq: Every day | ORAL | Status: DC

## 2014-11-09 NOTE — Assessment & Plan Note (Signed)
Resolved on no iron.

## 2014-11-09 NOTE — Progress Notes (Signed)
   Subjective:    Patient ID: Kristine [name], female    DOB: 1941-05-14, 74 y.o.   MRN: [id number]  HPI 53 yea rold female pt with history of HTN, high chol and hypothyroidism presents for 6 months follow up.   Hypertension: Well controlled on HCTZ , BP Readings from Last 3 Encounters:  11/09/14 124/70  07/22/14 126/78  05/07/14 124/66  Using medication without problems or lightheadedness:  Not  Chest pain with exertion:None Edema:None Short of breath:None Average home BPs: Not checking. Other issues:  Elevated Cholesterol: LDL at goal < 130 . On zocor 40 mg daily.  trig are almost at goal.   Lab Results  Component Value Date   CHOL 180 11/02/2014   HDL 57.30 11/02/2014   LDLCALC 90 11/02/2014   LDLDIRECT 110.3 03/17/2009   TRIG 164.0* 11/02/2014   CHOLHDL 3 11/02/2014  Using medications without problems: None Muscle aches: None Diet compliance: Healthy eating Exercise: Golf, walking Other complaints:   Anemia, no bleeding   Review of Systems  Constitutional: Negative for fever and fatigue.  HENT: Negative for ear pain.   Eyes: Negative for pain.  Respiratory: Negative for shortness of breath and wheezing.   Cardiovascular: Negative for chest pain and leg swelling.  Gastrointestinal: Negative for abdominal pain.  Musculoskeletal:       Foot pain, chronic  Neurological: Negative for numbness.       Objective:   Physical Exam  Constitutional: Vital signs are normal. She appears well-developed and well-nourished. She is cooperative.  Non-toxic appearance. She does not appear ill. No distress.  HENT:  Head: Normocephalic.  Right Ear: Hearing, tympanic membrane, external ear and ear canal normal. Tympanic membrane is not erythematous, not retracted and not bulging.  Left Ear: Hearing, tympanic membrane, external ear and ear canal normal. Tympanic membrane is not erythematous, not retracted and not bulging.  Nose: No mucosal edema or rhinorrhea. Right sinus exhibits  no maxillary sinus tenderness and no frontal sinus tenderness. Left sinus exhibits no maxillary sinus tenderness and no frontal sinus tenderness.  Mouth/Throat: Uvula is midline, oropharynx is clear and moist and mucous membranes are normal.  Eyes: Conjunctivae, EOM and lids are normal. Pupils are equal, round, and reactive to light. Lids are everted and swept, no foreign bodies found.  Neck: Trachea normal and normal range of motion. Neck supple. Carotid bruit is not present. No thyroid mass and no thyromegaly present.  Cardiovascular: Normal rate, regular rhythm, S1 normal, S2 normal, normal heart sounds, intact distal pulses and normal pulses.  Exam reveals no gallop and no friction rub.   No murmur heard. Pulmonary/Chest: Effort normal and breath sounds normal. No tachypnea. No respiratory distress. She has no decreased breath sounds. She has no wheezes. She has no rhonchi. She has no rales.  Abdominal: Soft. Normal appearance and bowel sounds are normal. There is no tenderness.  Neurological: She is alert.  Skin: Skin is warm, dry and intact. No rash noted.  Psychiatric: Her speech is normal and behavior is normal. Judgment and thought content normal. Her mood appears not anxious. Cognition and memory are normal. She does not exhibit a depressed mood.          Assessment & Plan:

## 2014-11-09 NOTE — Assessment & Plan Note (Signed)
Well controlled. Continue current medication. Encouraged exercise, weight loss, healthy eating habits.  

## 2014-11-09 NOTE — Patient Instructions (Signed)
Keep working on [employer] and regular exercise. Work on weight loss.

## 2014-11-09 NOTE — Assessment & Plan Note (Signed)
Stable control on simvastatin 40 mg daily No SE. Recheck in 6 months.

## 2014-11-09 NOTE — Progress Notes (Signed)
Pre visit review using our clinic review tool, if applicable. No additional management support is needed unless otherwise documented below in the visit note. 

## 2014-12-22 ENCOUNTER — Ambulatory Visit (INDEPENDENT_AMBULATORY_CARE_PROVIDER_SITE_OTHER): Payer: Federal, State, Local not specified - PPO | Admitting: Family Medicine

## 2014-12-22 ENCOUNTER — Encounter: Payer: Self-pay | Admitting: Family Medicine

## 2014-12-22 VITALS — BP 150/78 | HR 75 | Temp 97.5°F | Ht 66.0 in | Wt 230.5 lb

## 2014-12-22 DIAGNOSIS — M67951 Unspecified disorder of synovium and tendon, right thigh: Secondary | ICD-10-CM

## 2014-12-22 DIAGNOSIS — R2689 Other abnormalities of gait and mobility: Secondary | ICD-10-CM

## 2014-12-22 DIAGNOSIS — M7061 Trochanteric bursitis, right hip: Secondary | ICD-10-CM | POA: Diagnosis not present

## 2014-12-22 DIAGNOSIS — R29898 Other symptoms and signs involving the musculoskeletal system: Secondary | ICD-10-CM

## 2014-12-22 DIAGNOSIS — M545 Low back pain: Secondary | ICD-10-CM | POA: Diagnosis not present

## 2014-12-22 DIAGNOSIS — M6289 Other specified disorders of muscle: Secondary | ICD-10-CM | POA: Diagnosis not present

## 2014-12-22 DIAGNOSIS — R269 Unspecified abnormalities of gait and mobility: Secondary | ICD-10-CM

## 2014-12-22 DIAGNOSIS — M6798 Unspecified disorder of synovium and tendon, other site: Secondary | ICD-10-CM

## 2014-12-22 DIAGNOSIS — G8929 Other chronic pain: Secondary | ICD-10-CM

## 2014-12-22 NOTE — Progress Notes (Signed)
Pre visit review using our clinic review tool, if applicable. No additional management support is needed unless otherwise documented below in the visit note. 

## 2014-12-22 NOTE — Progress Notes (Signed)
Dr. Frederico Hamman T. Ernst Cumpston, MD, [facility] [place], [address] Phone: [phone] Fax: [phone]  12/22/2014  Patient: [name], MRN: [id number], DOB: [birth year], 74 y.o.  Primary Physician:  Kristine Lofts, MD  Chief Complaint: Back Pain  Subjective:   Kristine [name] is a 74 y.o. very pleasant female patient who presents with the following:  Pleasant patient who is had a multi-month history of right-sided lateral hip pain as well as right-sided posterior buttocks pain without numbness or radiculopathy.  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  She is a patient of Dr. Ricki [name], and per her report, she has severe osteoarthritis of the right hip as well.  The patient is limping quite a bit, and she is having pain and walking at baseline, and is favoring her right leg quite a bit.  Right now she is not having any true groin pain, and her range of motion is fairly preserved.  She is having minimal to no back pain currently.  Past Medical History, Surgical History, Social History, Family History, Problem List, Medications, and Allergies have been reviewed and updated if relevant.  Patient Active Problem List   Diagnosis Date Noted  . Chronic low back pain 11/16/2011  . 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 Pure hypercholesterolemia 03/17/2009  . Chronic constipation 01/26/2009  . PERIPHERAL NEUROPATHY 11/12/2008  . Essential hypertension, benign 03/05/2007  . Hypothyroidism 02/21/2007  . OBESITY NOS 02/12/2007  . Iron deficiency anemia 02/05/2007  . COMMON MIGRAINE 02/05/2007  . ALLERGIC RHINITIS 02/05/2007  . OSTEOARTHRITIS 02/05/2007    Past Medical History  Diagnosis Date  . Asthma   . Allergy   . Hypertension   . Thyroid disease   . Anal fissure     Past Surgical History  Procedure Laterality Date  . Breast biopsy    . Lipoma excision    . Hemorroidectomy    . Total abdominal  hysterectomy w/ bilateral salpingoophorectomy    . Abdominal hysterectomy    . Tubal ligation    . Hip surgery  2011    L hip replacement  . Rectal polypectomy  2010    History   Social History  . Marital Status: Married    Spouse Name: N/A  . Number of Children: N/A  . Years of Education: N/A   Occupational History  . Not on file.   Social History Main Topics  . Smoking status: Never Smoker   . Smokeless tobacco: Never Used  . Alcohol Use: No  . Drug Use: No  . Sexual Activity: Not on file   Other Topics Concern  . Not on file   Social History Narrative    Family History  Problem Relation Age of Onset  . Hyperlipidemia Mother   . Heart disease Mother     heart attack  . Kidney disease Father   . Cancer Sister     multiple myeloma  . Diabetes Brother     Allergies  Allergen Reactions  . Demerol   . Influenza Vaccines     flushing  . Meperidine Hcl     REACTION: Flushing    Medication list reviewed and updated in full in [place].  GEN: No fevers, chills. Nontoxic. Primarily MSK c/o today. MSK: Detailed in the HPI GI: tolerating PO intake without difficulty Neuro: No numbness, parasthesias, or tingling associated. Otherwise the pertinent positives of the ROS are noted  above.   Objective:   BP 150/78 mmHg  Pulse 75  Temp(Src) 97.5 F (36.4 C) (Oral)  Ht 5' 6" (1.676 m)  Wt 230 lb 8 oz (104.554 kg)  BMI 37.22 kg/m2   GEN: WDWN, NAD, Non-toxic, Alert & Oriented x 3 HEENT: Atraumatic, Normocephalic.  Ears and Nose: No external deformity. EXTR: No clubbing/cyanosis/edema NEURO: notably antalgic gait, favoring the right side PSYCH: Normally interactive. Conversant. Not depressed or anxious appearing.  Calm demeanor.   HIP EXAM: SIDE: B ROM: Abduction, Flexion, Internal and External range of motion: Right hip with relatively full abduction and minimal loss of motion with internal and external range of motion with the hip flexed to  90. Pain with terminal IROM and EROM: mild GTB: markedly TTP on R Additional pain on the pelvic rim, more lateral on the right SLR: NEG Knees: No effusion FABER: NT REVERSE FABER: NT, neg Piriformis: NT at direct palpation RIGHT Str: flexion: 4+/5 abduction: 4--/5 adduction: 4+/5 Strength testing somewhat tender  The entirety of the lumbar spine is nontender at the spinous processes, and she does have some mild muscle spasm, more on the left compared to the right relatively diffusely from L2-S1.  Neurovascularly intact.  Radiology: No results found.  Assessment and Plan:   Trochanteric bursitis of right hip  Chronic low back pain  Weakness of both hips  Tendinopathy of right gluteus medius  Antalgic gait  >25 minutes spent in face to face time with patient, >50% spent in counselling or coordination of care   Dramatically weak core.  Unclear which of these things began first or if her altered gait patterns initiated some trochanteric bursitis and tendinopathy, and this pain resulted in weakening of her pelvic stability.  Certainly, the reverse could've also been more than initiator.  This is affectively impossible to determine.  I do not feel that this is a primary back issue right now.  Recommended trochanteric bursa injection, but the patient declined.  Reviewed extensive hip rehabilitation program from [facility].  She has difficulties in doing days, or she needs more assistance, formal physical therapy is certainly an option at any point.  Her hip range of motion is quite good, and she is not having any groin pain, so I doubtful that intra-articular pathology is playing much of a role here.  Follow-up: prn  Signed,  Kristine T. Copland, MD   Patient's Medications  New Prescriptions   No medications on file  Previous Medications   AMOXICILLIN (AMOXIL) 500 MG CAPSULE    Take 4 tablets prior to procedure   BIOTIN PO    Take 1 each by mouth daily.   CYANOCOBALAMIN 2000  MCG TABLET    Take 2,000 mcg by mouth daily.   ESTROGENS, CONJUGATED, (PREMARIN) 0.625 MG TABLET    Take 1 tablet (0.625 mg total) by mouth daily.   FEXOFENADINE (ALLEGRA) 180 MG TABLET    Take by mouth daily.     FLUTICASONE (FLONASE) 50 MCG/ACT NASAL SPRAY    Place 2 sprays into both nostrils daily.   LEVOTHYROXINE (SYNTHROID, LEVOTHROID) 75 MCG TABLET    TAKE 1 TABLET (75 MCG TOTAL) BY MOUTH DAILY BEFORE BREAKFAST.   LOSARTAN-HYDROCHLOROTHIAZIDE (HYZAAR) 100-25 MG PER TABLET    Take 1 tablet by mouth daily.   MONTELUKAST (SINGULAIR) 10 MG TABLET    Take 1 tablet (10 mg total) by mouth at bedtime.   OMEGA-3 FATTY ACIDS (OMEGA-3 FISH OIL) 1200 MG CAPS    Take 3 capsules by mouth   2 (two) times daily.   SIMVASTATIN (ZOCOR) 40 MG TABLET    TAKE 1 TABLET BY MOUTH ONCE A DAY   SULINDAC (CLINORIL) 150 MG TABLET    TAKE 1 TABLET BY MOUTH TWICE A DAY  Modified Medications   No medications on file  Discontinued Medications   No medications on file       

## 2014-12-23 ENCOUNTER — Encounter: Payer: Self-pay | Admitting: Family Medicine

## 2015-04-05 ENCOUNTER — Encounter: Payer: Self-pay | Admitting: Internal Medicine

## 2015-05-06 ENCOUNTER — Telehealth: Payer: Self-pay | Admitting: Family Medicine

## 2015-05-06 DIAGNOSIS — E7800 Pure hypercholesterolemia, unspecified: Secondary | ICD-10-CM

## 2015-05-06 DIAGNOSIS — E039 Hypothyroidism, unspecified: Secondary | ICD-10-CM

## 2015-05-06 DIAGNOSIS — D509 Iron deficiency anemia, unspecified: Secondary | ICD-10-CM

## 2015-05-06 NOTE — Telephone Encounter (Signed)
-----   Message from Ellamae Sia sent at 05/04/2015  3:06 PM EST ----- Regarding: Lab orders for Monday, 12.12.16 Patient is scheduled for CPX labs, please order future labs, Thanks , Karna Christmas

## 2015-05-09 ENCOUNTER — Other Ambulatory Visit (INDEPENDENT_AMBULATORY_CARE_PROVIDER_SITE_OTHER): Payer: Federal, State, Local not specified - PPO

## 2015-05-09 DIAGNOSIS — E039 Hypothyroidism, unspecified: Secondary | ICD-10-CM | POA: Diagnosis not present

## 2015-05-09 DIAGNOSIS — D509 Iron deficiency anemia, unspecified: Secondary | ICD-10-CM

## 2015-05-09 DIAGNOSIS — E7800 Pure hypercholesterolemia, unspecified: Secondary | ICD-10-CM

## 2015-05-09 LAB — COMPREHENSIVE METABOLIC PANEL
ALBUMIN: 3.8 g/dL (ref 3.5–5.2)
ALT: 12 U/L (ref 0–35)
AST: 11 U/L (ref 0–37)
Alkaline Phosphatase: 47 U/L (ref 39–117)
BUN: 21 mg/dL (ref 6–23)
CHLORIDE: 105 meq/L (ref 96–112)
CO2: 29 meq/L (ref 19–32)
CREATININE: 0.71 mg/dL (ref 0.40–1.20)
Calcium: 8.9 mg/dL (ref 8.4–10.5)
GFR: 85.22 mL/min (ref >60.00)
Glucose, Bld: 102 mg/dL — ABNORMAL HIGH (ref 70–99)
POTASSIUM: 4 meq/L (ref 3.5–5.1)
SODIUM: 141 meq/L (ref 135–145)
Total Bilirubin: 0.5 mg/dL (ref 0.2–1.2)
Total Protein: 6 g/dL (ref 6.0–8.3)

## 2015-05-09 LAB — LIPID PANEL
CHOL/HDL RATIO: 3
CHOLESTEROL: 166 mg/dL (ref 0–200)
HDL: 51.7 mg/dL (ref >39.00)
NONHDL: 114.21
TRIGLYCERIDES: 208 mg/dL — AB (ref 0.0–149.0)
VLDL: 41.6 mg/dL — AB (ref 0.0–40.0)

## 2015-05-09 LAB — CBC WITH DIFFERENTIAL/PLATELET
BASOS PCT: 0.4 % (ref 0.0–3.0)
Basophils Absolute: 0 10*3/uL (ref 0.0–0.1)
EOS ABS: 0.3 10*3/uL (ref 0.0–0.7)
EOS PCT: 4.8 % (ref 0.0–5.0)
HCT: 39.8 % (ref 36.0–46.0)
Hemoglobin: 13.6 g/dL (ref 12.0–15.0)
LYMPHS ABS: 1.8 10*3/uL (ref 0.7–4.0)
Lymphocytes Relative: 28.4 % (ref 12.0–46.0)
MCHC: 34.1 g/dL (ref 30.0–36.0)
MCV: 87.3 fl (ref 78.0–100.0)
MONO ABS: 0.4 10*3/uL (ref 0.1–1.0)
Monocytes Relative: 6.7 % (ref 3.0–12.0)
NEUTROS ABS: 3.7 10*3/uL (ref 1.4–7.7)
Neutrophils Relative %: 59.7 % (ref 43.0–77.0)
PLATELETS: 204 10*3/uL (ref 150.0–400.0)
RBC: 4.56 Mil/uL (ref 3.87–5.11)
RDW: 12.9 % (ref 11.5–15.5)
WBC: 6.2 10*3/uL (ref 4.0–10.5)

## 2015-05-09 LAB — TSH: TSH: 4.66 u[IU]/mL — ABNORMAL HIGH (ref 0.35–4.50)

## 2015-05-09 LAB — T3, FREE: T3 FREE: 2.6 pg/mL (ref 2.3–4.2)

## 2015-05-09 LAB — T4, FREE: Free T4: 0.84 ng/dL (ref 0.60–1.60)

## 2015-05-09 LAB — LDL CHOLESTEROL, DIRECT: Direct LDL: 86 mg/dL

## 2015-05-12 ENCOUNTER — Ambulatory Visit (INDEPENDENT_AMBULATORY_CARE_PROVIDER_SITE_OTHER): Payer: Federal, State, Local not specified - PPO | Admitting: Family Medicine

## 2015-05-12 ENCOUNTER — Encounter: Payer: Self-pay | Admitting: Family Medicine

## 2015-05-12 VITALS — BP 140/72 | HR 70 | Temp 97.5°F | Ht 66.0 in | Wt 225.8 lb

## 2015-05-12 DIAGNOSIS — E7800 Pure hypercholesterolemia, unspecified: Secondary | ICD-10-CM

## 2015-05-12 DIAGNOSIS — Z0000 Encounter for general adult medical examination without abnormal findings: Secondary | ICD-10-CM | POA: Diagnosis not present

## 2015-05-12 DIAGNOSIS — I10 Essential (primary) hypertension: Secondary | ICD-10-CM

## 2015-05-12 DIAGNOSIS — E039 Hypothyroidism, unspecified: Secondary | ICD-10-CM | POA: Diagnosis not present

## 2015-05-12 DIAGNOSIS — D509 Iron deficiency anemia, unspecified: Secondary | ICD-10-CM

## 2015-05-12 DIAGNOSIS — R7303 Prediabetes: Secondary | ICD-10-CM | POA: Insufficient documentation

## 2015-05-12 MED ORDER — ESTROGENS CONJUGATED 0.625 MG PO TABS: 0.6250 mg | ORAL_TABLET | Freq: Every day | ORAL | Status: DC

## 2015-05-12 MED ORDER — MONTELUKAST SODIUM 10 MG PO TABS: 10.0000 mg | ORAL_TABLET | Freq: Every day | ORAL | Status: DC

## 2015-05-12 NOTE — Assessment & Plan Note (Signed)
Stable control on current med. 

## 2015-05-12 NOTE — Assessment & Plan Note (Signed)
LDL at goal. Trig elevated. Work on low carb low fat diet. Get back to exercise.

## 2015-05-12 NOTE — Progress Notes (Signed)
Pre visit review using our clinic review tool, if applicable. No additional management support is needed unless otherwise documented below in the visit note. 

## 2015-05-12 NOTE — Assessment & Plan Note (Signed)
Encouraged exercise, weight loss, healthy eating habits. ? ?

## 2015-05-12 NOTE — Progress Notes (Signed)
The patient is here for annual wellness exam and preventative care.  Does not have medicare.  Elevated Cholesterol: At goal LDL <130 on zocor 40 mg daily, trig high  Lab Results  Component Value Date   CHOL 166 05/09/2015   HDL 51.70 05/09/2015   LDLCALC 90 11/02/2014   LDLDIRECT 86.0 05/09/2015   TRIG 208.0* 05/09/2015   CHOLHDL 3 05/09/2015  Using medications without problems: None  Muscle aches: None  Diet compliance:Good  Exercise: Minimal, some walking.  Other complaints:     Hypertension: Well controlled on HCTZ, slightly elevated today. BP Readings from Last 3 Encounters:  05/12/15 140/72  12/22/14 150/78  11/09/14 124/70  Using medication without problems or lightheadedness:  None Chest pain with exertion:None  Edema:None  Short of breath:None Average home BPs: not checking Other issues:   Low thyroid: Stable on current med dose.  nml free t3 an free t4. Lab Results  Component Value Date   TSH 4.66* 05/09/2015   Review of Systems  Constitutional: Negative for fever, fatigue and unexpected weight change.  HENT: Negative for ear pain, congestion, sore throat, sneezing, trouble swallowing and sinus pressure.  Eyes: Negative for pain and itching.  Respiratory: Negative for cough, shortness of breath and wheezing.  Cardiovascular: Negative for chest pain, palpitations and leg swelling.  Gastrointestinal: Negative for nausea, abdominal pain, diarrhea, constipation and blood in stool.  Genitourinary: Negative for dysuria, hematuria, vaginal discharge, difficulty urinating and menstrual problem.  Skin: Negative for rash.  Neurological: Negative for syncope, weakness, light-headedness, numbness and headaches.  Psychiatric/Behavioral: Negative for confusion and dysphoric mood. The patient is not nervous/anxious.  Objective:   Physical Exam  Physical Exam  Constitutional: Vital signs are normal. She appears well-developed and well-nourished. She  is cooperative. Non-toxic appearance. She does not appear ill. No distress.  overweight  HENT:  Head: Normocephalic.  Right Ear: Hearing, tympanic membrane, external ear and ear canal normal.  Left Ear: Hearing, tympanic membrane, external ear and ear canal normal.  Nose: Nose normal.  Eyes: Conjunctivae, EOM and lids are normal. Pupils are equal, round, and reactive to light. Lids are everted and swept, no foreign bodies found.  Neck: Trachea normal and normal range of motion. Neck supple. Carotid bruit is not present. No mass and no thyromegaly present.  Cardiovascular: Normal rate, regular rhythm, S1 normal, S2 normal, normal heart sounds and intact distal pulses. Exam reveals no gallop.  No murmur heard. Pulmonary/Chest: Effort normal and breath sounds normal. No respiratory distress. She has no wheezes. She has no rhonchi. She has no rales.  Abdominal: Soft. Normal appearance and bowel sounds are normal. She exhibits no distension, no fluid wave, no abdominal bruit and no mass. There is no hepatosplenomegaly. There is no tenderness. There is no rebound, no guarding and no CVA tenderness. No hernia.  Genitourinary: No breast swelling, tenderness, discharge or bleeding. Lymphadenopathy:   She has no cervical adenopathy.   She has no axillary adenopathy.  Neurological: She is alert. She has normal strength. No cranial nerve deficit or sensory deficit.  Skin: Skin is warm, dry and intact. No rash noted.  Psychiatric: Her speech is normal and behavior is normal. Judgment normal. Her mood appears not anxious. Cognition and memory are normal. She does not exhibit a depressed mood.     Assessment & Plan:   Complete Physical Exam: The patient's preventative maintenance and recommended screening tests for an annual wellness exam were reviewed in full today.  Brought up to date unless  services declined.  Counselled on the importance of diet, exercise, and its role in overall  health and mortality.  The patient's FH and SH was reviewed, including their home life, tobacco status, and drug and alcohol status.   Vaccines: Uptodate Td, PNA,zoster; SE to Flu in past:refused. Marland Kitchen PAP/DVE: Pap/DVE not indicated.  Asymptomatic. Mammogram Nml 09/2014  DEXA:nml 05/2010, repeat in 5 years  Colon: nml 2009, Dr. Olevia Perches, repeat in 10 years: 2019. Nonsmoker

## 2015-05-12 NOTE — Addendum Note (Signed)
Addended by: [name] on: 05/12/2015 02:47 PM   Modules accepted: Orders

## 2015-05-12 NOTE — Assessment & Plan Note (Signed)
Resolved

## 2015-05-12 NOTE — Assessment & Plan Note (Signed)
Well controlled. Continue current medication.  

## 2015-05-12 NOTE — Patient Instructions (Addendum)
Check blood pressure at home.. Call if consistently > 140/90.  Low salt diet, low carb diet.

## 2015-06-07 ENCOUNTER — Other Ambulatory Visit: Payer: Self-pay | Admitting: Family Medicine

## 2015-07-21 ENCOUNTER — Other Ambulatory Visit: Payer: Self-pay | Admitting: Family Medicine

## 2015-08-04 ENCOUNTER — Telehealth: Payer: Self-pay | Admitting: Family Medicine

## 2015-08-04 NOTE — Telephone Encounter (Signed)
Pt called to get some names of foot dr She wants to make her own appointment She tried the [facility] several years ago and was not happy with them

## 2015-08-05 NOTE — Telephone Encounter (Signed)
Called patient and gave her the [facility] phone number.

## 2015-09-04 ENCOUNTER — Other Ambulatory Visit: Payer: Self-pay | Admitting: Family Medicine

## 2015-10-17 ENCOUNTER — Other Ambulatory Visit: Payer: Self-pay | Admitting: Family Medicine

## 2015-10-27 ENCOUNTER — Encounter: Payer: Self-pay | Admitting: Family Medicine

## 2015-10-28 ENCOUNTER — Ambulatory Visit: Payer: Federal, State, Local not specified - PPO | Admitting: Family Medicine

## 2015-10-28 ENCOUNTER — Ambulatory Visit (INDEPENDENT_AMBULATORY_CARE_PROVIDER_SITE_OTHER): Payer: Federal, State, Local not specified - PPO | Admitting: Family Medicine

## 2015-10-28 ENCOUNTER — Encounter: Payer: Self-pay | Admitting: Family Medicine

## 2015-10-28 VITALS — BP 158/70 | HR 74 | Temp 97.9°F | Wt 231.0 lb

## 2015-10-28 DIAGNOSIS — L237 Allergic contact dermatitis due to plants, except food: Secondary | ICD-10-CM | POA: Diagnosis not present

## 2015-10-28 MED ORDER — TRIAMCINOLONE ACETONIDE 0.1 % EX CREA: 1.0000 "application " | TOPICAL_CREAM | Freq: Two times a day (BID) | CUTANEOUS | Status: DC | PRN

## 2015-10-28 MED ORDER — PREDNISONE 20 MG PO TABS: ORAL_TABLET | ORAL | Status: DC

## 2015-10-28 NOTE — Progress Notes (Signed)
Pre visit review using our clinic review tool, if applicable. No additional management support is needed unless otherwise documented below in the visit note.  Concern for rash.  New patient to me, usually sees Dr. Diona Browner.    About 2 weeks ago she was out in the yard.  Likely had poison ivy exposure.  She had gloves on.  Then had rash on her L face.  Then had rash on arms and legs.  New spots are still coming up.  Irregular usually linear itchy spots.  No FCANVD. She doesn't feel sick.   Used topical cream, hydrocortisone and caladryl, minimal effect.    Meds, vitals, and allergies reviewed.   ROS: Per HPI unless specifically indicated in ROS section   nad ncat OP wnl ctab rrr Irregular mostly linear itchy spots noted on the ext, resolving on the L cheek

## 2015-10-28 NOTE — Patient Instructions (Signed)
Use the cream and then the pills if needed.  Take care.  Glad to see you.

## 2015-10-30 NOTE — Assessment & Plan Note (Signed)
Doesn't look infected.  Dw pt.  Use TAC initially then pred if needed, with routine steroid cautions.   F/u prn.

## 2015-11-10 ENCOUNTER — Telehealth: Payer: Self-pay | Admitting: Family Medicine

## 2015-11-10 ENCOUNTER — Other Ambulatory Visit (INDEPENDENT_AMBULATORY_CARE_PROVIDER_SITE_OTHER): Payer: Federal, State, Local not specified - PPO

## 2015-11-10 DIAGNOSIS — E7800 Pure hypercholesterolemia, unspecified: Secondary | ICD-10-CM

## 2015-11-10 LAB — LIPID PANEL
CHOL/HDL RATIO: 3
Cholesterol: 207 mg/dL — ABNORMAL HIGH (ref 0–200)
HDL: 63.3 mg/dL (ref >39.00)
LDL CALC: 109 mg/dL — AB (ref 0–99)
NONHDL: 143.49
Triglycerides: 174 mg/dL — ABNORMAL HIGH (ref 0.0–149.0)
VLDL: 34.8 mg/dL (ref 0.0–40.0)

## 2015-11-10 LAB — COMPREHENSIVE METABOLIC PANEL
ALT: 16 U/L (ref 0–35)
AST: 12 U/L (ref 0–37)
Albumin: 4 g/dL (ref 3.5–5.2)
Alkaline Phosphatase: 40 U/L (ref 39–117)
BUN: 21 mg/dL (ref 6–23)
CHLORIDE: 105 meq/L (ref 96–112)
CO2: 28 meq/L (ref 19–32)
Calcium: 8.9 mg/dL (ref 8.4–10.5)
Creatinine, Ser: 0.66 mg/dL (ref 0.40–1.20)
GFR: 92.59 mL/min (ref >60.00)
GLUCOSE: 97 mg/dL (ref 70–99)
POTASSIUM: 3.9 meq/L (ref 3.5–5.1)
Sodium: 141 mEq/L (ref 135–145)
Total Bilirubin: 0.7 mg/dL (ref 0.2–1.2)
Total Protein: 6.3 g/dL (ref 6.0–8.3)

## 2015-11-10 NOTE — Telephone Encounter (Signed)
-----   Message from Marchia Bond sent at 11/02/2015  3:25 PM EDT ----- Regarding: 6 mo f/u labs Thurs 6/15, need orders. Thanks! :-) Please order future 6 mo f/u labs for pt's upcoming lab appt. Thanks Aniceto Boss

## 2015-11-15 ENCOUNTER — Ambulatory Visit: Payer: Federal, State, Local not specified - PPO | Admitting: Family Medicine

## 2015-11-22 ENCOUNTER — Ambulatory Visit (INDEPENDENT_AMBULATORY_CARE_PROVIDER_SITE_OTHER): Payer: Federal, State, Local not specified - PPO | Admitting: Family Medicine

## 2015-11-22 ENCOUNTER — Encounter: Payer: Self-pay | Admitting: Family Medicine

## 2015-11-22 VITALS — BP 146/70 | HR 71 | Temp 98.5°F | Ht 66.0 in | Wt 230.5 lb

## 2015-11-22 DIAGNOSIS — I10 Essential (primary) hypertension: Secondary | ICD-10-CM | POA: Diagnosis not present

## 2015-11-22 DIAGNOSIS — E7800 Pure hypercholesterolemia, unspecified: Secondary | ICD-10-CM | POA: Diagnosis not present

## 2015-11-22 NOTE — Assessment & Plan Note (Signed)
Well controlled. Continue current medication. Tolerating lipitor.

## 2015-11-22 NOTE — Assessment & Plan Note (Signed)
Borderline control.. Follow at home.

## 2015-11-22 NOTE — Progress Notes (Signed)
Pre visit review using our clinic review tool, if applicable. No additional management support is needed unless otherwise documented below in the visit note. 

## 2015-11-22 NOTE — Progress Notes (Signed)
75 year old female pt with history of HTN, high chol and hypothyroidism presents for 6 months follow up.  Hypertension: Well controlled on HCTZ , BP Readings from Last 3 Encounters:  11/22/15 146/70  10/28/15 158/70  05/12/15 140/72  Using medication without problems or lightheadedness:no  Chest pain with exertion:None Edema:None Short of breath:None Average home BPs: 135-140/75-80 Other issues:  Elevated Cholesterol: LDL  NOW at goal < 130 . On zocor 40 mg daily. trig are almost at goal. Lab Results  Component Value Date   CHOL 207* 11/10/2015   HDL 63.30 11/10/2015   LDLCALC 109* 11/10/2015   LDLDIRECT 86.0 05/09/2015   TRIG 174.0* 11/10/2015   CHOLHDL 3 11/10/2015  Using medications without problems: None Muscle aches: None Diet compliance: Healthy eating Exercise: Golf, walking Other complaints:  Prediabetes: resolved with diet changes.  Social History /Family History/Past Medical History reviewed and updated if needed.   Review of Systems  Constitutional: Negative for fever and fatigue.  HENT: Negative for ear pain.  Eyes: Negative for pain.  Respiratory: Negative for shortness of breath and wheezing.  Cardiovascular: Negative for chest pain and leg swelling.  Gastrointestinal: Negative for abdominal pain.  Musculoskeletal:   Foot pain, chronic  Neurological: Negative for numbness.       Objective:   Physical Exam  Constitutional: Vital signs are normal. She appears well-developed and well-nourished. She is cooperative. Non-toxic appearance. She does not appear ill. No distress.  HENT:  Head: Normocephalic.  Right Ear: Hearing, tympanic membrane, external ear and ear canal normal. Tympanic membrane is not erythematous, not retracted and not bulging.  Left Ear: Hearing, tympanic membrane, external ear and ear canal normal. Tympanic membrane is not erythematous, not retracted and not bulging.  Nose: No mucosal edema or rhinorrhea. Right sinus  exhibits no maxillary sinus tenderness and no frontal sinus tenderness. Left sinus exhibits no maxillary sinus tenderness and no frontal sinus tenderness.  Mouth/Throat: Uvula is midline, oropharynx is clear and moist and mucous membranes are normal.  Eyes: Conjunctivae, EOM and lids are normal. Pupils are equal, round, and reactive to light. Lids are everted and swept, no foreign bodies found.  Neck: Trachea normal and normal range of motion. Neck supple. Carotid bruit is not present. No thyroid mass and no thyromegaly present.  Cardiovascular: Normal rate, regular rhythm, S1 normal, S2 normal, normal heart sounds, intact distal pulses and normal pulses. Exam reveals no gallop and no friction rub.  No murmur heard. Pulmonary/Chest: Effort normal and breath sounds normal. No tachypnea. No respiratory distress. She has no decreased breath sounds. She has no wheezes. She has no rhonchi. She has no rales.  Abdominal: Soft. Normal appearance and bowel sounds are normal. There is no tenderness.  Neurological: She is alert.  Skin: Skin is warm, dry and intact. No rash noted.  Psychiatric: Her speech is normal and behavior is normal. Judgment and thought content normal. Her mood appears not anxious. Cognition and memory are normal. She does not exhibit a depressed mood.

## 2015-11-22 NOTE — Patient Instructions (Addendum)
Follow BP at home.. Call if running > 140/90 consistently.  Keep working on healthy eating, exercise and weight loss.

## 2016-02-02 ENCOUNTER — Other Ambulatory Visit: Payer: Self-pay | Admitting: Family Medicine

## 2016-02-02 DIAGNOSIS — Z1231 Encounter for screening mammogram for malignant neoplasm of breast: Secondary | ICD-10-CM

## 2016-02-09 ENCOUNTER — Ambulatory Visit
Admission: RE | Admit: 2016-02-09 | Discharge: 2016-02-09 | Disposition: A | Discharge status: Home / Self Care | Payer: Federal, State, Local not specified - PPO | Source: Ambulatory Visit | Attending: Family Medicine | Admitting: Family Medicine

## 2016-02-09 DIAGNOSIS — Z1231 Encounter for screening mammogram for malignant neoplasm of breast: Secondary | ICD-10-CM

## 2016-04-08 ENCOUNTER — Other Ambulatory Visit: Payer: Self-pay | Admitting: Family Medicine

## 2016-05-09 ENCOUNTER — Other Ambulatory Visit: Payer: Self-pay | Admitting: Family Medicine

## 2016-05-10 ENCOUNTER — Telehealth: Payer: Self-pay | Admitting: Family Medicine

## 2016-05-10 ENCOUNTER — Other Ambulatory Visit (INDEPENDENT_AMBULATORY_CARE_PROVIDER_SITE_OTHER): Payer: Federal, State, Local not specified - PPO

## 2016-05-10 DIAGNOSIS — D509 Iron deficiency anemia, unspecified: Secondary | ICD-10-CM | POA: Diagnosis not present

## 2016-05-10 DIAGNOSIS — R7303 Prediabetes: Secondary | ICD-10-CM

## 2016-05-10 DIAGNOSIS — E039 Hypothyroidism, unspecified: Secondary | ICD-10-CM

## 2016-05-10 DIAGNOSIS — E7800 Pure hypercholesterolemia, unspecified: Secondary | ICD-10-CM | POA: Diagnosis not present

## 2016-05-10 LAB — HEMOGLOBIN A1C: Hgb A1c MFr Bld: 5.6 % (ref 4.6–6.5)

## 2016-05-10 LAB — COMPREHENSIVE METABOLIC PANEL
ALK PHOS: 44 U/L (ref 39–117)
ALT: 12 U/L (ref 0–35)
AST: 10 U/L (ref 0–37)
Albumin: 4.1 g/dL (ref 3.5–5.2)
BUN: 20 mg/dL (ref 6–23)
CALCIUM: 8.9 mg/dL (ref 8.4–10.5)
CO2: 31 meq/L (ref 19–32)
Chloride: 106 mEq/L (ref 96–112)
Creatinine, Ser: 0.62 mg/dL (ref 0.40–1.20)
GFR: 99.38 mL/min (ref >60.00)
GLUCOSE: 110 mg/dL — AB (ref 70–99)
POTASSIUM: 3.8 meq/L (ref 3.5–5.1)
Sodium: 141 mEq/L (ref 135–145)
Total Bilirubin: 0.6 mg/dL (ref 0.2–1.2)
Total Protein: 6.2 g/dL (ref 6.0–8.3)

## 2016-05-10 LAB — LIPID PANEL
CHOL/HDL RATIO: 3
Cholesterol: 182 mg/dL (ref 0–200)
HDL: 66.3 mg/dL (ref >39.00)
LDL CALC: 90 mg/dL (ref 0–99)
NONHDL: 115.2
Triglycerides: 125 mg/dL (ref 0.0–149.0)
VLDL: 25 mg/dL (ref 0.0–40.0)

## 2016-05-10 LAB — T4, FREE: Free T4: 0.89 ng/dL (ref 0.60–1.60)

## 2016-05-10 LAB — T3, FREE: T3, Free: 2.6 pg/mL (ref 2.3–4.2)

## 2016-05-10 LAB — TSH: TSH: 3.93 u[IU]/mL (ref 0.35–4.50)

## 2016-05-10 NOTE — Telephone Encounter (Signed)
-----   Message from Ellamae Sia sent at 05/03/2016  3:51 PM EST ----- Regarding: Lab orders for Thursday, 12.14.17 Patient is scheduled for CPX labs, please order future labs, Thanks , Karna Christmas

## 2016-05-11 LAB — CBC WITH DIFFERENTIAL/PLATELET
BASOS ABS: 0 10*3/uL (ref 0.0–0.1)
Basophils Relative: 0.3 % (ref 0.0–3.0)
EOS ABS: 0.4 10*3/uL (ref 0.0–0.7)
EOS PCT: 6.3 % — AB (ref 0.0–5.0)
HEMATOCRIT: 40.9 % (ref 36.0–46.0)
Hemoglobin: 14 g/dL (ref 12.0–15.0)
Lymphocytes Relative: 36.3 % (ref 12.0–46.0)
Lymphs Abs: 2.1 10*3/uL (ref 0.7–4.0)
MCHC: 34.3 g/dL (ref 30.0–36.0)
MCV: 87.2 fl (ref 78.0–100.0)
MONO ABS: 0.4 10*3/uL (ref 0.1–1.0)
Monocytes Relative: 7.1 % (ref 3.0–12.0)
Neutro Abs: 2.9 10*3/uL (ref 1.4–7.7)
Neutrophils Relative %: 50 % (ref 43.0–77.0)
PLATELETS: 206 10*3/uL (ref 150.0–400.0)
RBC: 4.69 Mil/uL (ref 3.87–5.11)
RDW: 13.6 % (ref 11.5–15.5)
WBC: 5.9 10*3/uL (ref 4.0–10.5)

## 2016-05-15 ENCOUNTER — Ambulatory Visit (INDEPENDENT_AMBULATORY_CARE_PROVIDER_SITE_OTHER): Payer: Federal, State, Local not specified - PPO | Admitting: Family Medicine

## 2016-05-15 ENCOUNTER — Encounter: Payer: Self-pay | Admitting: Family Medicine

## 2016-05-15 VITALS — BP 130/70 | HR 66 | Temp 98.4°F | Ht 65.5 in | Wt 229.5 lb

## 2016-05-15 DIAGNOSIS — E039 Hypothyroidism, unspecified: Secondary | ICD-10-CM

## 2016-05-15 DIAGNOSIS — I10 Essential (primary) hypertension: Secondary | ICD-10-CM | POA: Diagnosis not present

## 2016-05-15 DIAGNOSIS — K5909 Other constipation: Secondary | ICD-10-CM

## 2016-05-15 DIAGNOSIS — Z0000 Encounter for general adult medical examination without abnormal findings: Secondary | ICD-10-CM | POA: Diagnosis not present

## 2016-05-15 DIAGNOSIS — E2839 Other primary ovarian failure: Secondary | ICD-10-CM

## 2016-05-15 DIAGNOSIS — E7800 Pure hypercholesterolemia, unspecified: Secondary | ICD-10-CM | POA: Diagnosis not present

## 2016-05-15 DIAGNOSIS — R7303 Prediabetes: Secondary | ICD-10-CM | POA: Diagnosis not present

## 2016-05-15 DIAGNOSIS — N3941 Urge incontinence: Secondary | ICD-10-CM | POA: Insufficient documentation

## 2016-05-15 DIAGNOSIS — D509 Iron deficiency anemia, unspecified: Secondary | ICD-10-CM

## 2016-05-15 MED ORDER — MIRABEGRON ER 25 MG PO TB24: 25.0000 mg | ORAL_TABLET | Freq: Every day | ORAL | 3 refills | Status: DC

## 2016-05-15 MED ORDER — ESTROGENS CONJUGATED 0.625 MG PO TABS: 0.6250 mg | ORAL_TABLET | Freq: Every day | ORAL | 3 refills | Status: DC

## 2016-05-15 MED ORDER — MONTELUKAST SODIUM 10 MG PO TABS: 10.0000 mg | ORAL_TABLET | Freq: Every day | ORAL | 3 refills | Status: DC

## 2016-05-15 NOTE — Assessment & Plan Note (Signed)
Well controlled. Continue current medication.  

## 2016-05-15 NOTE — Assessment & Plan Note (Signed)
Stable on current regimen   

## 2016-05-15 NOTE — Patient Instructions (Addendum)
Stop at front desk to set up bone density.  Work on [employer] and regular exercise. Work on weight loss as able.  Trial of myrbetric.. Call if not improving in 8 weeks.

## 2016-05-15 NOTE — Progress Notes (Signed)
Subjective:    Patient ID: Kristine [name], female    DOB: 12-22-1940, 75 y.o.   MRN: [id number]  HPI  75 year old female presents for annual wellness exam.. Does not have medicare.  Elevated Cholesterol: Moderate risk on moderate dose 40 mg daily. Using medications without problems:none Muscle aches: none Diet compliance: good Exercise: golf, walking Other complaints:   Wt Readings from Last 3 Encounters:  05/15/16 229 lb 8 oz (104.1 kg)  11/22/15 230 lb 8 oz (104.6 kg)  10/28/15 231 lb (104.8 kg)     Hypertension:    Well controlled on HCTZ. BP Readings from Last 3 Encounters:  05/15/16 130/70  11/22/15 (!) 146/70  10/28/15 (!) 158/70  Using medication without problems or lightheadedness:  None Chest pain with exertion:None Edema:occ Short of breath:none Average home BPs: good Other issues:  Hypothyroid: stable control on current replacement Lab Results  Component Value Date   TSH 3.93 05/10/2016   Prediabetes stable control, almost nml Lab Results  Component Value Date   HGBA1C 5.6 05/10/2016     Social History /Family History/Past Medical History reviewed and updated if needed.   Review of Systems  Constitutional: Negative for fatigue and fever.  HENT: Negative for congestion.   Eyes: Negative for pain.  Respiratory: Negative for cough and shortness of breath.   Cardiovascular: Negative for chest pain, palpitations and leg swelling.  Gastrointestinal: Positive for constipation. Negative for abdominal pain.       Well controlled on current meds.  Genitourinary: Negative for dysuria and vaginal bleeding.       Urgency  Increase in incontinence, occ does not make it to the bathroom.. Wearing thin pad   no hematuria.She has nocturia.. Urinates every 2 hours a night( 2-3 times a night)  Musculoskeletal: Negative for back pain.  Neurological: Negative for syncope, light-headedness and headaches.  Psychiatric/Behavioral: Negative for dysphoric mood.       Objective:   Physical Exam  Constitutional: Vital signs are normal. She appears well-developed and well-nourished. She is cooperative.  Non-toxic appearance. She does not appear ill. No distress.  HENT:  Head: Normocephalic.  Right Ear: Hearing, tympanic membrane, external ear and ear canal normal.  Left Ear: Hearing, tympanic membrane, external ear and ear canal normal.  Nose: Nose normal.  Eyes: Conjunctivae, EOM and lids are normal. Pupils are equal, round, and reactive to light. Lids are everted and swept, no foreign bodies found.  Neck: Trachea normal and normal range of motion. Neck supple. Carotid bruit is not present. No thyroid mass and no thyromegaly present.  Cardiovascular: Normal rate, regular rhythm, S1 normal, S2 normal, normal heart sounds and intact distal pulses.  Exam reveals no gallop.   No murmur heard. Pulmonary/Chest: Effort normal and breath sounds normal. No respiratory distress. She has no wheezes. She has no rhonchi. She has no rales.  Abdominal: Soft. Normal appearance and bowel sounds are normal. She exhibits no distension, no fluid wave, no abdominal bruit and no mass. There is no hepatosplenomegaly. There is no tenderness. There is no rebound, no guarding and no CVA tenderness. No hernia.  Lymphadenopathy:    She has no cervical adenopathy.    She has no axillary adenopathy.  Neurological: She is alert. She has normal strength. No cranial nerve deficit or sensory deficit.  Skin: Skin is warm, dry and intact. No rash noted.  Psychiatric: Her speech is normal and behavior is normal. Judgment normal. Her mood appears not anxious. Cognition and memory are  normal. She does not exhibit a depressed mood.          Assessment & Plan:  The patient's preventative maintenance and recommended screening tests for an annual wellness exam were reviewed in full today. Brought up to date unless services declined.  Counselled on the importance of diet, exercise, and its  role in overall health and mortality. The patient's FH and SH was reviewed, including their home life, tobacco status, and drug and alcohol status.   Vaccines: uptodate Td, PNA, zoster. SE to flu in past refused Pap/DVE: Pap/DVE not indicated.  Asymptomatic. Mammo: nml 01/2016, plans continuing yearly Bone Density:2012, nml.. Due this year. Colon: nml 2009, Dr. Olevia Perches, repeat in 10 years: 2019. Smoking Status: none

## 2016-05-15 NOTE — Assessment & Plan Note (Signed)
Stable control. 

## 2016-05-15 NOTE — Addendum Note (Signed)
Addended by: [name] on: 05/15/2016 03:10 PM   Modules accepted: Orders

## 2016-05-15 NOTE — Assessment & Plan Note (Signed)
Start trial of myrbetriq... Instead of anticolinergic given already has issues with constipation.

## 2016-05-15 NOTE — Assessment & Plan Note (Signed)
Well controlled. Continue current medication. Encouraged exercise, weight loss, healthy eating habits.  

## 2016-05-15 NOTE — Progress Notes (Signed)
Pre visit review using our clinic review tool, if applicable. No additional management support is needed unless otherwise documented below in the visit note. 

## 2016-05-16 ENCOUNTER — Encounter: Payer: Self-pay | Admitting: Family Medicine

## 2016-05-18 ENCOUNTER — Other Ambulatory Visit (INDEPENDENT_AMBULATORY_CARE_PROVIDER_SITE_OTHER): Payer: Federal, State, Local not specified - PPO

## 2016-05-18 DIAGNOSIS — E538 Deficiency of other specified B group vitamins: Secondary | ICD-10-CM | POA: Diagnosis not present

## 2016-05-18 LAB — VITAMIN B12: Vitamin B-12: 1166 pg/mL — ABNORMAL HIGH (ref 211–911)

## 2016-05-23 ENCOUNTER — Other Ambulatory Visit: Payer: Self-pay | Admitting: Family Medicine

## 2016-05-31 ENCOUNTER — Ambulatory Visit
Admission: RE | Admit: 2016-05-31 | Discharge: 2016-05-31 | Disposition: A | Discharge status: Home / Self Care | Payer: Federal, State, Local not specified - PPO | Source: Ambulatory Visit | Attending: Family Medicine | Admitting: Family Medicine

## 2016-05-31 DIAGNOSIS — E2839 Other primary ovarian failure: Secondary | ICD-10-CM

## 2016-06-01 ENCOUNTER — Telehealth: Payer: Self-pay | Admitting: Family Medicine

## 2016-06-01 NOTE — Telephone Encounter (Signed)
[facility] Patient Name: Kristine [name] DOB: February 13, 1941 Initial Comment Caller says, vertigo for 10-12 days, which occurs when she is laying down or is getting up from the laying position. Once in a while it occurs when she goes from sitting to standing. The episodes last around 10 - 30 seconds. Nurse Assessment Nurse: Dimas Chyle, RN, Dellis Filbert Date/Time Eilene Ghazi Time): 06/01/2016 10:42:34 AM Confirm and document reason for call. If symptomatic, describe symptoms. ---Caller says, vertigo for 10-12 days, which occurs when she is laying down or is getting up from the laying position. Once in a while it occurs when she goes from sitting to standing. The episodes last around 10 - 30 seconds. Since 05/21/16. Does the patient have any new or worsening symptoms? ---Yes Will a triage be completed? ---Yes Related visit to physician within the last 2 weeks? ---No Does the PT have any chronic conditions? (i.e. diabetes, asthma, etc.) ---Yes List chronic conditions. ---HTN Is this a behavioral health or substance abuse call? ---No Guidelines Guideline Title Affirmed Question Affirmed Notes Dizziness - Vertigo [1] MILD dizziness (e.g., vertigo; walking normally) AND [2] has NOT been evaluated by physician for this Final Disposition User See PCP When Office is Open (within 3 days) Dimas Chyle, RN, [place] did not want to be seen by another provider so next available appointment was for 06/06/15 with PCP. Disagree/Comply: Comply

## 2016-06-01 NOTE — Telephone Encounter (Signed)
Pt has appt with Dr Diona Browner on 06/05/16 at 1:15.

## 2016-06-05 ENCOUNTER — Ambulatory Visit (INDEPENDENT_AMBULATORY_CARE_PROVIDER_SITE_OTHER): Payer: Federal, State, Local not specified - PPO | Admitting: Family Medicine

## 2016-06-05 ENCOUNTER — Encounter: Payer: Self-pay | Admitting: Family Medicine

## 2016-06-05 DIAGNOSIS — H8110 Benign paroxysmal vertigo, unspecified ear: Secondary | ICD-10-CM | POA: Diagnosis not present

## 2016-06-05 MED ORDER — MIRABEGRON ER 25 MG PO TB24: 25.0000 mg | ORAL_TABLET | Freq: Every day | ORAL | 1 refills | Status: DC

## 2016-06-05 NOTE — Patient Instructions (Signed)
Start densensitization exercises multiple times daily. No driving until resolved. If not improiving in 3-4 weeks.. Call for balance retraining referral.     Benign Positional Vertigo Introduction Vertigo is the feeling that you or your surroundings are moving when they are not. Benign positional vertigo is the most common form of vertigo. The cause of this condition is not serious (is benign). This condition is triggered by certain movements and positions (is positional). This condition can be dangerous if it occurs while you are doing something that could endanger you or others, such as driving. What are the causes? In many cases, the cause of this condition is not known. It may be caused by a disturbance in an area of the inner ear that helps your brain to sense movement and balance. This disturbance can be caused by a viral infection (labyrinthitis), head injury, or repetitive motion. What increases the risk? This condition is more likely to develop in:  Women.  People who are 30 years of age or older. What are the signs or symptoms? Symptoms of this condition usually happen when you move your head or your eyes in different directions. Symptoms may start suddenly, and they usually last for less than a minute. Symptoms may include:  Loss of balance and falling.  Feeling like you are spinning or moving.  Feeling like your surroundings are spinning or moving.  Nausea and vomiting.  Blurred vision.  Dizziness.  Involuntary eye movement (nystagmus). Symptoms can be mild and cause only slight annoyance, or they can be severe and interfere with daily life. Episodes of benign positional vertigo may return (recur) over time, and they may be triggered by certain movements. Symptoms may improve over time. How is this diagnosed? This condition is usually diagnosed by medical history and a physical exam of the head, neck, and ears. You may be referred to a health care provider who  specializes in ear, nose, and throat (ENT) problems (otolaryngologist) or a provider who specializes in disorders of the nervous system (neurologist). You may have additional testing, including:  MRI.  A CT scan.  Eye movement tests. Your health care provider may ask you to change positions quickly while he or she watches you for symptoms of benign positional vertigo, such as nystagmus. Eye movement may be tested with an electronystagmogram (ENG), caloric stimulation, the Dix-Hallpike test, or the roll test.  An electroencephalogram (EEG). This records electrical activity in your brain.  Hearing tests. How is this treated? Usually, your health care provider will treat this by moving your head in specific positions to adjust your inner ear back to normal. Surgery may be needed in severe cases, but this is rare. In some cases, benign positional vertigo may resolve on its own in 2-4 weeks. Follow these instructions at home: Safety  Move slowly.Avoid sudden body or head movements.  Avoid driving.  Avoid operating heavy machinery.  Avoid doing any tasks that would be dangerous to you or others if a vertigo episode would occur.  If you have trouble walking or keeping your balance, try using a cane for stability. If you feel dizzy or unstable, sit down right away.  Return to your normal activities as told by your health care provider. Ask your health care provider what activities are safe for you. General instructions  Take over-the-counter and prescription medicines only as told by your health care provider.  Avoid certain positions or movements as told by your health care provider.  Drink enough fluid to keep your urine  clear or pale yellow.  Keep all follow-up visits as told by your health care provider. This is important. Contact a health care provider if:  You have a fever.  Your condition gets worse or you develop new symptoms.  Your family or friends notice any behavioral  changes.  Your nausea or vomiting gets worse.  You have numbness or a "pins and needles" sensation. Get help right away if:  You have difficulty speaking or moving.  You are always dizzy.  You faint.  You develop severe headaches.  You have weakness in your legs or arms.  You have changes in your hearing or vision.  You develop a stiff neck.  You develop sensitivity to light. This information is not intended to replace advice given to you by your health care provider. Make sure you discuss any questions you have with your health care provider. Document Released: 02/19/2006 Document Revised: 10/20/2015 Document Reviewed: 09/06/2014  2017 Elsevier

## 2016-06-05 NOTE — Assessment & Plan Note (Signed)
Start densensitization exercises multiple times daily. No driving until resolved. If not improiving in 3-4 weeks.. Call for balance retraining referral.

## 2016-06-05 NOTE — Progress Notes (Signed)
Subjective:    Patient ID: Kristine [name], female    DOB: Oct 21, 1940, 76 y.o.   MRN: [id number]  HPI   76 year old female pt presents with new onset vertigo.  She  Has noted room spinning sensation with movement.  Occurs when tipping head back. Lying down and getting out of bed. Sit on edge of bed  Noted first on 12/25. Off and on since. Not happening with walking, except fell at night getting up from bed, lunched into shower door.  Mild congestion.. Using flonase. No ear pain, feels may have wax in ears.Marland Kitchen occ has to pop ears. No headache.  Head feels heavy.  BP elevated today.. Pt taking her losartan HCTZ, rushed here. BP Readings from Last 3 Encounters:  06/05/16 (!) 146/74  05/15/16 130/70  11/22/15 (!) 146/70     Review of Systems  Constitutional: Negative for fatigue and fever.  HENT: Negative for ear pain.   Eyes: Negative for pain.  Respiratory: Negative for chest tightness and shortness of breath.   Cardiovascular: Negative for chest pain, palpitations and leg swelling.  Gastrointestinal: Negative for abdominal pain.  Genitourinary: Negative for dysuria.       Objective:   Physical Exam  Constitutional: She is oriented to person, place, and time. Vital signs are normal. She appears well-developed and well-nourished. She is cooperative.  Non-toxic appearance. She does not appear ill. No distress.  HENT:  Head: Normocephalic.  Right Ear: Hearing, tympanic membrane, external ear and ear canal normal. Tympanic membrane is not erythematous, not retracted and not bulging.  Left Ear: Hearing, tympanic membrane, external ear and ear canal normal. Tympanic membrane is not erythematous, not retracted and not bulging.  Nose: No mucosal edema or rhinorrhea. Right sinus exhibits no maxillary sinus tenderness and no frontal sinus tenderness. Left sinus exhibits no maxillary sinus tenderness and no frontal sinus tenderness.  Mouth/Throat: Uvula is midline, oropharynx is clear  and moist and mucous membranes are normal.  Eyes: Conjunctivae, EOM and lids are normal. Pupils are equal, round, and reactive to light. Lids are everted and swept, no foreign bodies found.  Neck: Trachea normal and normal range of motion. Neck supple. Carotid bruit is not present. No thyroid mass and no thyromegaly present.  Cardiovascular: Normal rate, regular rhythm, S1 normal, S2 normal, normal heart sounds, intact distal pulses and normal pulses.  Exam reveals no gallop and no friction rub.   No murmur heard. Pulmonary/Chest: Effort normal and breath sounds normal. No tachypnea. No respiratory distress. She has no decreased breath sounds. She has no wheezes. She has no rhonchi. She has no rales.  Abdominal: Soft. Normal appearance and bowel sounds are normal. There is no tenderness.  Neurological: She is alert and oriented to person, place, and time. She has normal strength and normal reflexes. No cranial nerve deficit or sensory deficit. She exhibits normal muscle tone. She displays a negative Romberg sign. Coordination and gait normal. GCS eye subscore is 4. GCS verbal subscore is 5. GCS motor subscore is 6.  Nml cerebellar exam   No papilledema  Skin: Skin is warm, dry and intact. No rash noted.  Psychiatric: She has a normal mood and affect. Her speech is normal and behavior is normal. Judgment and thought content normal. Her mood appears not anxious. Cognition and memory are normal. Cognition and memory are not impaired. She does not exhibit a depressed mood. She exhibits normal recent memory and normal remote memory.   NYSTAGMUS, rightward with Marye Round  modified.       Assessment & Plan:

## 2016-06-05 NOTE — Progress Notes (Signed)
Pre visit review using our clinic review tool, if applicable. No additional management support is needed unless otherwise documented below in the visit note. 

## 2016-06-05 NOTE — Addendum Note (Signed)
Addended by: [name] on: 06/05/2016 02:23 PM   Modules accepted: Orders

## 2016-09-20 ENCOUNTER — Encounter: Payer: Self-pay | Admitting: Family Medicine

## 2016-09-30 ENCOUNTER — Other Ambulatory Visit: Payer: Self-pay | Admitting: Family Medicine

## 2016-11-04 ENCOUNTER — Other Ambulatory Visit: Payer: Self-pay | Admitting: Family Medicine

## 2016-11-09 ENCOUNTER — Telehealth: Payer: Self-pay | Admitting: Family Medicine

## 2016-11-09 ENCOUNTER — Other Ambulatory Visit (INDEPENDENT_AMBULATORY_CARE_PROVIDER_SITE_OTHER): Payer: Federal, State, Local not specified - PPO

## 2016-11-09 DIAGNOSIS — E7800 Pure hypercholesterolemia, unspecified: Secondary | ICD-10-CM

## 2016-11-09 DIAGNOSIS — R7303 Prediabetes: Secondary | ICD-10-CM

## 2016-11-09 DIAGNOSIS — D509 Iron deficiency anemia, unspecified: Secondary | ICD-10-CM

## 2016-11-09 DIAGNOSIS — E039 Hypothyroidism, unspecified: Secondary | ICD-10-CM

## 2016-11-09 LAB — LIPID PANEL
CHOL/HDL RATIO: 3
Cholesterol: 162 mg/dL (ref 0–200)
HDL: 53.3 mg/dL (ref >39.00)
LDL Cholesterol: 73 mg/dL (ref 0–99)
NONHDL: 109
TRIGLYCERIDES: 178 mg/dL — AB (ref 0.0–149.0)
VLDL: 35.6 mg/dL (ref 0.0–40.0)

## 2016-11-09 LAB — COMPREHENSIVE METABOLIC PANEL
ALK PHOS: 42 U/L (ref 39–117)
ALT: 14 U/L (ref 0–35)
AST: 14 U/L (ref 0–37)
Albumin: 4 g/dL (ref 3.5–5.2)
BILIRUBIN TOTAL: 0.5 mg/dL (ref 0.2–1.2)
BUN: 21 mg/dL (ref 6–23)
CO2: 28 meq/L (ref 19–32)
Calcium: 9.2 mg/dL (ref 8.4–10.5)
Chloride: 106 mEq/L (ref 96–112)
Creatinine, Ser: 0.63 mg/dL (ref 0.40–1.20)
GFR: 97.44 mL/min (ref >60.00)
Glucose, Bld: 112 mg/dL — ABNORMAL HIGH (ref 70–99)
POTASSIUM: 4 meq/L (ref 3.5–5.1)
Sodium: 141 mEq/L (ref 135–145)
TOTAL PROTEIN: 6.1 g/dL (ref 6.0–8.3)

## 2016-11-09 LAB — HEMOGLOBIN A1C: Hgb A1c MFr Bld: 5.8 % (ref 4.6–6.5)

## 2016-11-09 NOTE — Telephone Encounter (Signed)
-----   Message from Ellamae Sia sent at 10/29/2016  4:20 PM EDT ----- Regarding: Lab orders for Friday, 6.15.18 Lab orders for a 6 month follow up appt

## 2016-11-16 ENCOUNTER — Ambulatory Visit: Payer: Federal, State, Local not specified - PPO | Admitting: Family Medicine

## 2016-11-20 ENCOUNTER — Ambulatory Visit (INDEPENDENT_AMBULATORY_CARE_PROVIDER_SITE_OTHER): Payer: Federal, State, Local not specified - PPO | Admitting: Family Medicine

## 2016-11-20 ENCOUNTER — Encounter: Payer: Self-pay | Admitting: Family Medicine

## 2016-11-20 DIAGNOSIS — I10 Essential (primary) hypertension: Secondary | ICD-10-CM | POA: Diagnosis not present

## 2016-11-20 DIAGNOSIS — E7800 Pure hypercholesterolemia, unspecified: Secondary | ICD-10-CM

## 2016-11-20 DIAGNOSIS — R7303 Prediabetes: Secondary | ICD-10-CM | POA: Diagnosis not present

## 2016-11-20 DIAGNOSIS — H938X2 Other specified disorders of left ear: Secondary | ICD-10-CM | POA: Diagnosis not present

## 2016-11-20 MED ORDER — ALPRAZOLAM 0.25 MG PO TABS: 0.2500 mg | ORAL_TABLET | Freq: Every day | ORAL | 0 refills | Status: DC | PRN

## 2016-11-20 NOTE — Assessment & Plan Note (Signed)
Given unilateral and heartbeat sound.. Needs to evaluate with MRI brain to rule out aneurysm.

## 2016-11-20 NOTE — Patient Instructions (Addendum)
Follow blood pressure at home.. Call if consistently > 140/90. Keep working on healthy eating and regular exercise. Continue 6 tabs daily of omega three. Please stop at the front desk to set up referral. Prior to test take alprazolam x 1.

## 2016-11-20 NOTE — Assessment & Plan Note (Signed)
Follow at home to determine if medication increase needed.

## 2016-11-20 NOTE — Assessment & Plan Note (Signed)
Stable control. 

## 2016-11-20 NOTE — Assessment & Plan Note (Signed)
Good control on lipitor 40 mg daily.

## 2016-11-20 NOTE — Progress Notes (Signed)
Subjective:    Patient ID: Kristine [name], female    DOB: June 24, 1940, 76 y.o.   MRN: [id number]  HPI    76 year old female presents for 6 month follow up.  She has note heartbeat in  Left ear only for several years.. Not changing. No headache.  Notes mainly at night.  No family history of anuresym.  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 Using sulindac.   Vertigo has resolved after cerumen impaction removed.  Elevated Cholesterol:  On lipitor 40 mg daily. Lab Results  Component Value Date   CHOL 162 11/09/2016   HDL 53.30 11/09/2016   LDLCALC 73 11/09/2016   LDLDIRECT 86.0 05/09/2015   TRIG 178.0 (H) 11/09/2016   CHOLHDL 3 11/09/2016  Using medications without problems:none Muscle aches: none Diet compliance: good Exercise: golf and walking Other complaints:  Hypertension:    Elevated on HCTZ alone BP Readings from Last 3 Encounters:  11/20/16 (!) 154/86  06/05/16 (!) 142/63  05/15/16 130/70  Using medication without problems or lightheadedness:  none Chest pain with exertion: none Edema:none Short of breath: none Average home BPs: has not been checking. Other issues:  Prediabetes:  Stable Lab Results  Component Value Date   HGBA1C 5.8 11/09/2016    Blood pressure (!) 154/86, pulse 67, temperature 97.6 F (36.4 C), temperature source Oral, weight 233 lb (105.7 kg), SpO2 97 %.  Body mass index is 38.18 kg/m.   Review of Systems  Constitutional: Negative for fatigue and fever.  HENT: Negative for ear pain.   Eyes: Negative for pain.  Respiratory: Negative for chest tightness and shortness of breath.   Cardiovascular: Negative for chest pain, palpitations and leg swelling.  Gastrointestinal: Negative for abdominal pain.  Genitourinary: Negative for dysuria.       Objective:   Physical Exam  Constitutional: Vital signs are normal. She appears well-developed and well-nourished. She is cooperative.  Non-toxic appearance. She does not appear ill.  No distress.  obese  HENT:  Head: Normocephalic.  Right Ear: Hearing, tympanic membrane, external ear and ear canal normal.  Left Ear: Hearing, tympanic membrane, external ear and ear canal normal.  Nose: Nose normal.  Eyes: Conjunctivae, EOM and lids are normal. Pupils are equal, round, and reactive to light. Lids are everted and swept, no foreign bodies found.  Neck: Trachea normal and normal range of motion. Neck supple. Carotid bruit is not present. No thyroid mass and no thyromegaly present.  Cardiovascular: Normal rate, regular rhythm, S1 normal, S2 normal, normal heart sounds and intact distal pulses.  Exam reveals no gallop.   No murmur heard. Pulmonary/Chest: Effort normal and breath sounds normal. No respiratory distress. She has no wheezes. She has no rhonchi. She has no rales.  Abdominal: Soft. Normal appearance and bowel sounds are normal. She exhibits no distension, no fluid wave, no abdominal bruit and no mass. There is no hepatosplenomegaly. There is no tenderness. There is no rebound, no guarding and no CVA tenderness. No hernia.  Lymphadenopathy:    She has no cervical adenopathy.    She has no axillary adenopathy.  Neurological: She is alert. She has normal strength. No cranial nerve deficit or sensory deficit.  Skin: Skin is warm, dry and intact. No rash noted.  Psychiatric: Her speech is normal and behavior is normal. Judgment normal. Her mood appears not anxious. Cognition and memory are normal. She does not exhibit a depressed mood.  Assessment & Plan:

## 2016-11-25 ENCOUNTER — Other Ambulatory Visit: Payer: Self-pay | Admitting: Family Medicine

## 2016-12-05 ENCOUNTER — Ambulatory Visit
Admission: RE | Admit: 2016-12-05 | Discharge: 2016-12-05 | Disposition: A | Discharge status: Home / Self Care | Payer: Federal, State, Local not specified - PPO | Source: Ambulatory Visit | Attending: Family Medicine | Admitting: Family Medicine

## 2016-12-05 DIAGNOSIS — H938X2 Other specified disorders of left ear: Secondary | ICD-10-CM

## 2016-12-05 MED ORDER — GADOBENATE DIMEGLUMINE 529 MG/ML IV SOLN
20.0000 mL | Freq: Once | INTRAVENOUS | Status: AC | PRN
  Administered 2016-12-05: 20 mL via INTRAVENOUS

## 2016-12-12 ENCOUNTER — Telehealth: Payer: Self-pay | Admitting: Family Medicine

## 2016-12-12 DIAGNOSIS — R9389 Abnormal findings on diagnostic imaging of other specified body structures: Secondary | ICD-10-CM

## 2016-12-12 DIAGNOSIS — H938X2 Other specified disorders of left ear: Secondary | ICD-10-CM

## 2016-12-12 NOTE — Telephone Encounter (Signed)
-----   Message from [name], [place] sent at 12/10/2016 10:37 AM EDT ----- Mrs. Pascucci notified as instructed by telephone.  She is agreeable to the ENT referral.  She is requesting Dr. Tami Ribas in [place].  That is who her husband sees.  She is leaving this Thursday 12/13/2016 to go out of the country and will be unable until August 6.  She request that you call her cell phone when trying to reach her about the ENT appointment.

## 2016-12-12 NOTE — Telephone Encounter (Signed)
Referral sent 

## 2017-01-01 ENCOUNTER — Other Ambulatory Visit: Payer: Self-pay | Admitting: Otolaryngology

## 2017-01-01 DIAGNOSIS — D333 Benign neoplasm of cranial nerves: Secondary | ICD-10-CM

## 2017-01-01 DIAGNOSIS — R42 Dizziness and giddiness: Secondary | ICD-10-CM

## 2017-01-09 ENCOUNTER — Other Ambulatory Visit: Payer: Self-pay | Admitting: Family Medicine

## 2017-01-10 MED ORDER — ALPRAZOLAM 0.25 MG PO TABS: 0.2500 mg | ORAL_TABLET | Freq: Every day | ORAL | 0 refills | Status: DC | PRN

## 2017-01-10 NOTE — Telephone Encounter (Signed)
Refilled as requested  

## 2017-01-10 NOTE — Telephone Encounter (Signed)
Medication was called into patient's pharmacy.  Mychart message sent to pt. Nothing is needed

## 2017-01-13 ENCOUNTER — Ambulatory Visit
Admission: RE | Admit: 2017-01-13 | Discharge: 2017-01-13 | Disposition: A | Discharge status: Home / Self Care | Payer: Federal, State, Local not specified - PPO | Source: Ambulatory Visit | Attending: Otolaryngology | Admitting: Otolaryngology

## 2017-01-13 DIAGNOSIS — D333 Benign neoplasm of cranial nerves: Secondary | ICD-10-CM

## 2017-01-13 DIAGNOSIS — R42 Dizziness and giddiness: Secondary | ICD-10-CM

## 2017-01-13 MED ORDER — GADOBENATE DIMEGLUMINE 529 MG/ML IV SOLN
20.0000 mL | Freq: Once | INTRAVENOUS | Status: AC | PRN
  Administered 2017-01-13: 20 mL via INTRAVENOUS

## 2017-02-01 ENCOUNTER — Encounter: Payer: Self-pay | Admitting: Family Medicine

## 2017-02-01 ENCOUNTER — Other Ambulatory Visit: Payer: Self-pay | Admitting: Family Medicine

## 2017-02-05 ENCOUNTER — Other Ambulatory Visit: Payer: Self-pay | Admitting: Orthopedic Surgery

## 2017-02-05 DIAGNOSIS — D333 Benign neoplasm of cranial nerves: Secondary | ICD-10-CM

## 2017-02-13 ENCOUNTER — Encounter: Payer: Self-pay | Admitting: Podiatry

## 2017-02-13 ENCOUNTER — Ambulatory Visit (INDEPENDENT_AMBULATORY_CARE_PROVIDER_SITE_OTHER): Payer: Federal, State, Local not specified - PPO

## 2017-02-13 ENCOUNTER — Encounter: Payer: Self-pay | Admitting: Family Medicine

## 2017-02-13 ENCOUNTER — Ambulatory Visit (INDEPENDENT_AMBULATORY_CARE_PROVIDER_SITE_OTHER): Payer: Federal, State, Local not specified - PPO | Admitting: Podiatry

## 2017-02-13 DIAGNOSIS — Q828 Other specified congenital malformations of skin: Secondary | ICD-10-CM

## 2017-02-13 DIAGNOSIS — M205X1 Other deformities of toe(s) (acquired), right foot: Secondary | ICD-10-CM | POA: Diagnosis not present

## 2017-02-13 DIAGNOSIS — M2041 Other hammer toe(s) (acquired), right foot: Secondary | ICD-10-CM

## 2017-02-13 DIAGNOSIS — L603 Nail dystrophy: Secondary | ICD-10-CM

## 2017-02-13 NOTE — Progress Notes (Signed)
Subjective:  Patient ID: Kristine [name], female    DOB: [birth year],  MRN: [id number] HPI Chief Complaint  Patient presents with  . Nail Problem    1st nail right and 2nd nails bilateral - thickened, discolored, brittle nails x years, 1st right nail fell off, tip of 2nd toe sensitive  . Foot Pain    1st MPJ right - states soreness big toe joint and notices hugs against 2nd toe right    76 y.o. female presents with the above complaint.   She presents today with chief complaint of painful hallux nail right foot. She states that not only is the hallux nail right painful but it appears that the toe is starting to overlap the second toe and is resulting in painful second toe as well as an blister that was present. She states that the toenail has recently become thick and brittle and has been doing so for years but really just recently fell off. She states that this tip of the second toe is tender bilaterally right greater than left. She states there is soreness in the first metatarsophalangeal joint which has been present for many years she states that the joint has been much that bigger and thicker than her other foot since she was a teenager.  Past Medical History:  Diagnosis Date  . Allergy   . Anal fissure   . Asthma   . Hypertension   . Thyroid disease    Past Surgical History:  Procedure Laterality Date  . ABDOMINAL HYSTERECTOMY    . BREAST BIOPSY    . HEMORROIDECTOMY    . HIP SURGERY  2011   L hip replacement  . LIPOMA EXCISION    . RECTAL POLYPECTOMY  2010  . TOTAL ABDOMINAL HYSTERECTOMY W/ BILATERAL SALPINGOOPHORECTOMY    . TUBAL LIGATION      Current Outpatient Prescriptions:  .  ALPRAZolam (XANAX) 0.25 MG tablet, Take 1-2 tablets (0.25-0.5 mg total) by mouth daily as needed (prior to MRI)., Disp: 4 tablet, Rfl: 0 .  amoxicillin (AMOXIL) 500 MG capsule, Take 4 tablets prior to procedure, Disp: 4 capsule, Rfl: 0 .  cyanocobalamin 2000 MCG tablet, Take 2,000 mcg by mouth  daily., Disp: , Rfl:  .  estrogens, conjugated, (PREMARIN) 0.625 MG tablet, Take 1 tablet (0.625 mg total) by mouth daily., Disp: 90 tablet, Rfl: 3 .  fexofenadine (ALLEGRA) 180 MG tablet, Take by mouth daily.  , Disp: , Rfl:  .  fluticasone (FLONASE) 50 MCG/ACT nasal spray, Place 2 sprays into both nostrils daily. Reported on 10/28/2015, Disp: , Rfl:  .  levothyroxine (SYNTHROID, LEVOTHROID) 75 MCG tablet, TAKE 1 TABLET (75 MCG TOTAL) BY MOUTH DAILY BEFORE BREAKFAST., Disp: 90 tablet, Rfl: 0 .  losartan-hydrochlorothiazide (HYZAAR) 100-25 MG tablet, TAKE 1 TABLET BY MOUTH DAILY., Disp: 90 tablet, Rfl: 0 .  montelukast (SINGULAIR) 10 MG tablet, Take 1 tablet (10 mg total) by mouth at bedtime., Disp: 90 tablet, Rfl: 3 .  Omega-3 Fatty Acids (OMEGA-3 FISH OIL) 1200 MG CAPS, Take 3 capsules by mouth 2 (two) times daily., Disp: , Rfl:  .  simvastatin (ZOCOR) 40 MG tablet, TAKE 1 TABLET BY MOUTH ONCE A DAY, Disp: 90 tablet, Rfl: 1 .  sulindac (CLINORIL) 150 MG tablet, TAKE 1 TABLET BY MOUTH TWICE A DAY, Disp: 180 tablet, Rfl: 1  Allergies  Allergen Reactions  . Demerol   . Influenza Vaccines     flushing  . Meperidine Hcl     REACTION: Flushing  Review of Systems  HENT: Positive for tinnitus.   Genitourinary: Positive for frequency and urgency.  All other systems reviewed and are negative.  Objective:  There were no vitals filed for this visit. General AA&O x3. Patient is alert and oriented.  Vascular Dorsalis pedis and posterior tibial pulses 2/4 bilat. Brisk capillary refill to all digits. Pedal hair present.  Neurologic Epicritic sensation grossly intact.  Dermatologic No open lesions. Interspaces clear of maceration. Nails well groomed and normal in appearance.She appears to have nail dystrophy or than likely secondary to the extension deformity at the level of the interphalangeal joint. Mild erythema overlying the DIPJ second digit right foot. No blisters or skin breakdown is noted.     Orthopedic: MMT 5/5 in dorsiflexion, plantarflexion, inversion, and eversion. Normal joint ROM without pain or crepitus.Hallux abductovalgus is present on the right foot with mild extensive. She has limitation on dorsiflexion which is painful. There is a valgus component to the abduction and she does have hallux extensus at the level of the hallux interphalangeal joint. 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  Plan: I have referred her to Dr. Amalia Hailey consideration of fusion of the first metatarsophalangeal joint and the hallux interphalangeal joint. At the same time he should be able to perform arthroplasties to the DIPJ second digits bilateral. I discussed this in great detail with the patient today explaining to her that I feel an implant or a replacement of the joint is not appropriate rather a fusion. She understands this is amenable to it will discuss this with Dr. Amalia Hailey in the near future as a surgical consult.     Tandrea Kommer T. [place], [place]

## 2017-02-15 ENCOUNTER — Ambulatory Visit (INDEPENDENT_AMBULATORY_CARE_PROVIDER_SITE_OTHER): Payer: Federal, State, Local not specified - PPO | Admitting: Podiatry

## 2017-02-15 ENCOUNTER — Ambulatory Visit (INDEPENDENT_AMBULATORY_CARE_PROVIDER_SITE_OTHER): Payer: Federal, State, Local not specified - PPO

## 2017-02-15 DIAGNOSIS — M2042 Other hammer toe(s) (acquired), left foot: Secondary | ICD-10-CM | POA: Diagnosis not present

## 2017-02-15 DIAGNOSIS — M205X1 Other deformities of toe(s) (acquired), right foot: Secondary | ICD-10-CM | POA: Diagnosis not present

## 2017-02-18 NOTE — Progress Notes (Signed)
   HPI: Patient is a 76 year old female presenting today, after being referred by Dr. Milinda Pointer, to discuss surgical correction of right hallux limitus and hammertoes of the second digits bilaterally. She is here for further evaluation and treatment.  Past Medical History:  Diagnosis Date  . Allergy   . Anal fissure   . Asthma   . Hypertension   . Thyroid disease      Physical Exam: General: The patient is alert and oriented x3 in no acute distress.  Dermatology: Skin is warm, dry and supple bilateral lower extremities. Negative for open lesions or macerations.  Vascular: Palpable pedal pulses bilaterally. No edema or erythema noted. Capillary refill within normal limits.  Neurological: Epicritic and protective threshold grossly intact bilaterally.   Musculoskeletal Exam: All pedal and ankle joints range of motion within normal limits bilateral. Muscle strength 5/5 in all groups bilateral. Hammertoe contracture deformity noted to the 2nd digits of the bilateral feet. Pain on palpation with limited range of motion noted to the first MPJ right foot.    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 - Today we discussed the conservative versus surgical management of the presenting pathology. The patient opts for surgical management. All possible complications and details of the procedure were explained. All patient questions were answered. No guarantees were expressed or implied. - Authorization for surgery was initiated today. Surgery will consist of hallux 1st MPJ arthrodesis right. DIPJ arthroplasty 2nd digits bilaterally. - CAM boot for right foot dispensed. - Post op shoe for left foot dispensed. -Return to clinic 1 week post op.   Edrick Kins, DPM [facility]  Dr. Edrick Kins, DPM    [address], [place] [address]                Office [phone]  Fax [phone]

## 2017-02-19 ENCOUNTER — Telehealth: Payer: Self-pay | Admitting: *Deleted

## 2017-02-19 DIAGNOSIS — M2042 Other hammer toe(s) (acquired), left foot: Secondary | ICD-10-CM

## 2017-02-19 DIAGNOSIS — M205X1 Other deformities of toe(s) (acquired), right foot: Secondary | ICD-10-CM

## 2017-02-19 DIAGNOSIS — M2041 Other hammer toe(s) (acquired), right foot: Secondary | ICD-10-CM

## 2017-02-19 NOTE — Telephone Encounter (Signed)
"  I'm a patient of Dr. Amalia Hailey in the [place] office.  I'm calling to schedule my surgery.  He said he's about a month out.  I would like to do my surgery on October 25.  Is that date available?  I have several questions.  How long will the surgery take?  The girl up from at the office said I would get a knee scooter.  This wasn't discussed with me.  How am I supposed to get around because he's doing surgery on my other foot too.  I have to be able to climb stairs to get in the house.  What time will I need to be there?  How long will the procedure take?"  October 25 is okay for the surgery date.  I do not know what you are having so I cannot tell you how long it will take.  Your surgery will be sometime that morning. I cannot give you a specific time because the surgical center may have cancellations or have Diabetics or children that need to go first.  I will call you once I find out what your surgical procedure is and tell you what is needed.  If you need a knee scooter, an order will be placed and someone will call you and inform you of steps that you need to do to get the scooter.

## 2017-03-01 NOTE — Telephone Encounter (Signed)
I placed an order for a knee scooter per Dr. Amalia Hailey.  I will inform [facility].

## 2017-03-04 ENCOUNTER — Telehealth: Payer: Self-pay | Admitting: *Deleted

## 2017-03-04 NOTE — Telephone Encounter (Signed)
"  I'd like to reschedule my surgery from 03/21/17 to 06/06/2017.  If it is available."  I'll get it rescheduled. "Thank you so much."  I called Caren Griffins at [facility] and rescheduled the surgery from 03/21/2017 to 06/06/2017.

## 2017-03-14 ENCOUNTER — Ambulatory Visit (INDEPENDENT_AMBULATORY_CARE_PROVIDER_SITE_OTHER): Payer: Federal, State, Local not specified - PPO | Admitting: Family Medicine

## 2017-03-14 ENCOUNTER — Encounter: Payer: Self-pay | Admitting: Family Medicine

## 2017-03-14 DIAGNOSIS — I6381 Other cerebral infarction due to occlusion or stenosis of small artery: Secondary | ICD-10-CM | POA: Diagnosis not present

## 2017-03-14 DIAGNOSIS — D333 Benign neoplasm of cranial nerves: Secondary | ICD-10-CM | POA: Diagnosis not present

## 2017-03-14 DIAGNOSIS — G609 Hereditary and idiopathic neuropathy, unspecified: Secondary | ICD-10-CM

## 2017-03-14 NOTE — Progress Notes (Signed)
   Subjective:    Patient ID: Kristine [name], female    DOB: 1940/09/16, 76 y.o.   MRN: [id number]  HPI   76 year old female presents for multiple issues.   She has upcoming OV with Dr. Ricki Rodriguez tommorow for left sided back and hip pain ongoing since May..  Given cortisone in left hip 5 weeks ago for bursitis. Helped minimally.  Cannot do prolonged walking or standing without pain.  Using sulindac and tens unit for pain.   She also is having issues with her neuropathy ( mild neuropathy on conduction test in past) and foot pain.  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 Referred to neuro.. Dr. Manuella Ghazi to eval further. Follow meningioma, schwannoma and lacunar infarct in 1 year with MRI. Cannot take aspirin as recommended given  causing ears ringing.   Review of Systems  Constitutional: Negative for fatigue and fever.  HENT: Negative for ear pain.   Eyes: Negative for pain.  Respiratory: Negative for chest tightness and shortness of breath.   Cardiovascular: Negative for chest pain, palpitations and leg swelling.  Gastrointestinal: Negative for abdominal pain.  Genitourinary: Negative for dysuria.       Objective:   Physical Exam  Constitutional: Vital signs are normal. She appears well-developed and well-nourished. She is cooperative.  Non-toxic appearance. She does not appear ill. No distress.  obese  HENT:  Head: Normocephalic.  Right Ear: Hearing, tympanic membrane, external ear and ear canal normal.  Left Ear: Hearing, tympanic membrane, external ear and ear canal normal.  Nose: Nose normal.  Eyes: Pupils are equal, round, and reactive to light. Conjunctivae, EOM and lids are normal. Lids are everted and swept, no foreign bodies found.  Neck: Trachea normal and normal range of motion. Neck supple. Carotid bruit is not present. No thyroid mass and no thyromegaly present.  Cardiovascular: Normal rate, regular rhythm, S1 normal,  S2 normal, normal heart sounds and intact distal pulses.  Exam reveals no gallop.   No murmur heard. Pulmonary/Chest: Effort normal and breath sounds normal. No respiratory distress. She has no wheezes. She has no rhonchi. She has no rales.  Abdominal: Soft. Normal appearance and bowel sounds are normal. She exhibits no distension, no fluid wave, no abdominal bruit and no mass. There is no hepatosplenomegaly. There is no tenderness. There is no rebound, no guarding and no CVA tenderness. No hernia.  Lymphadenopathy:    She has no cervical adenopathy.    She has no axillary adenopathy.  Neurological: She is alert. She has normal strength. No cranial nerve deficit or sensory deficit.  Skin: Skin is warm, dry and intact. No rash noted.  Psychiatric: Her speech is normal and behavior is normal. Judgment normal. Her mood appears not anxious. Cognition and memory are normal. She does not exhibit a depressed mood.          Assessment & Plan:

## 2017-03-14 NOTE — Patient Instructions (Signed)
Call for follow up if we need to look further into back as source of left sided back and hip pain.  Call if need referral for PT.

## 2017-03-27 DIAGNOSIS — D333 Benign neoplasm of cranial nerves: Secondary | ICD-10-CM | POA: Insufficient documentation

## 2017-03-27 DIAGNOSIS — Z8673 Personal history of transient ischemic attack (TIA), and cerebral infarction without residual deficits: Secondary | ICD-10-CM | POA: Insufficient documentation

## 2017-03-27 NOTE — Assessment & Plan Note (Signed)
Asymptomatic, small. Cannot tolerate aspirin as causes ear ringing.

## 2017-03-27 NOTE — Assessment & Plan Note (Signed)
Followed by ENT and neuro.

## 2017-03-27 NOTE — Assessment & Plan Note (Signed)
Not currently on medication.. Will await results of foot surgery to determine if she wants to try a trial of medication.

## 2017-03-29 ENCOUNTER — Encounter: Payer: Federal, State, Local not specified - PPO | Admitting: Podiatry

## 2017-04-05 ENCOUNTER — Encounter: Payer: Federal, State, Local not specified - PPO | Admitting: Podiatry

## 2017-04-10 ENCOUNTER — Other Ambulatory Visit: Payer: Self-pay | Admitting: Family Medicine

## 2017-04-10 DIAGNOSIS — Z1231 Encounter for screening mammogram for malignant neoplasm of breast: Secondary | ICD-10-CM

## 2017-04-15 ENCOUNTER — Other Ambulatory Visit: Payer: Self-pay | Admitting: Family Medicine

## 2017-04-21 ENCOUNTER — Other Ambulatory Visit: Payer: Self-pay | Admitting: Family Medicine

## 2017-04-29 ENCOUNTER — Other Ambulatory Visit: Payer: Self-pay | Admitting: Family Medicine

## 2017-04-29 MED ORDER — SULINDAC 150 MG PO TABS: 150.0000 mg | ORAL_TABLET | Freq: Two times a day (BID) | ORAL | 1 refills | Status: DC

## 2017-04-29 NOTE — Addendum Note (Signed)
Addended by: [name] on: 04/29/2017 12:45 PM   Modules accepted: Orders

## 2017-05-07 ENCOUNTER — Other Ambulatory Visit: Payer: Self-pay | Admitting: Orthopedic Surgery

## 2017-05-07 DIAGNOSIS — M160 Bilateral primary osteoarthritis of hip: Secondary | ICD-10-CM

## 2017-05-08 ENCOUNTER — Ambulatory Visit
Admission: RE | Admit: 2017-05-08 | Discharge: 2017-05-08 | Disposition: A | Discharge status: Home / Self Care | Payer: Federal, State, Local not specified - PPO | Source: Ambulatory Visit | Attending: Family Medicine | Admitting: Family Medicine

## 2017-05-08 ENCOUNTER — Ambulatory Visit: Payer: Federal, State, Local not specified - PPO

## 2017-05-08 DIAGNOSIS — Z1231 Encounter for screening mammogram for malignant neoplasm of breast: Secondary | ICD-10-CM

## 2017-05-13 ENCOUNTER — Other Ambulatory Visit (INDEPENDENT_AMBULATORY_CARE_PROVIDER_SITE_OTHER): Payer: Federal, State, Local not specified - PPO

## 2017-05-13 ENCOUNTER — Telehealth: Payer: Self-pay | Admitting: Family Medicine

## 2017-05-13 DIAGNOSIS — E7800 Pure hypercholesterolemia, unspecified: Secondary | ICD-10-CM | POA: Diagnosis not present

## 2017-05-13 DIAGNOSIS — D509 Iron deficiency anemia, unspecified: Secondary | ICD-10-CM

## 2017-05-13 DIAGNOSIS — E039 Hypothyroidism, unspecified: Secondary | ICD-10-CM | POA: Diagnosis not present

## 2017-05-13 DIAGNOSIS — R7303 Prediabetes: Secondary | ICD-10-CM

## 2017-05-13 LAB — LIPID PANEL
CHOL/HDL RATIO: 3
Cholesterol: 162 mg/dL (ref 0–200)
HDL: 58.6 mg/dL (ref >39.00)
LDL CALC: 67 mg/dL (ref 0–99)
NONHDL: 103.29
Triglycerides: 179 mg/dL — ABNORMAL HIGH (ref 0.0–149.0)
VLDL: 35.8 mg/dL (ref 0.0–40.0)

## 2017-05-13 LAB — COMPREHENSIVE METABOLIC PANEL
ALT: 15 U/L (ref 0–35)
AST: 13 U/L (ref 0–37)
Albumin: 3.9 g/dL (ref 3.5–5.2)
Alkaline Phosphatase: 40 U/L (ref 39–117)
BILIRUBIN TOTAL: 0.6 mg/dL (ref 0.2–1.2)
BUN: 22 mg/dL (ref 6–23)
CO2: 29 meq/L (ref 19–32)
CREATININE: 0.64 mg/dL (ref 0.40–1.20)
Calcium: 9 mg/dL (ref 8.4–10.5)
Chloride: 105 mEq/L (ref 96–112)
GFR: 95.55 mL/min (ref >60.00)
GLUCOSE: 103 mg/dL — AB (ref 70–99)
Potassium: 3.9 mEq/L (ref 3.5–5.1)
SODIUM: 140 meq/L (ref 135–145)
Total Protein: 6.3 g/dL (ref 6.0–8.3)

## 2017-05-13 LAB — CBC WITH DIFFERENTIAL/PLATELET
BASOS ABS: 0.1 10*3/uL (ref 0.0–0.1)
BASOS PCT: 0.8 % (ref 0.0–3.0)
EOS ABS: 0.3 10*3/uL (ref 0.0–0.7)
Eosinophils Relative: 4.5 % (ref 0.0–5.0)
HEMATOCRIT: 40.1 % (ref 36.0–46.0)
HEMOGLOBIN: 13.6 g/dL (ref 12.0–15.0)
LYMPHS PCT: 29.8 % (ref 12.0–46.0)
Lymphs Abs: 1.9 10*3/uL (ref 0.7–4.0)
MCHC: 33.9 g/dL (ref 30.0–36.0)
MCV: 89 fl (ref 78.0–100.0)
Monocytes Absolute: 0.5 10*3/uL (ref 0.1–1.0)
Monocytes Relative: 7.3 % (ref 3.0–12.0)
Neutro Abs: 3.7 10*3/uL (ref 1.4–7.7)
Neutrophils Relative %: 57.6 % (ref 43.0–77.0)
Platelets: 218 10*3/uL (ref 150.0–400.0)
RBC: 4.5 Mil/uL (ref 3.87–5.11)
RDW: 13.2 % (ref 11.5–15.5)
WBC: 6.5 10*3/uL (ref 4.0–10.5)

## 2017-05-13 LAB — T3, FREE: T3 FREE: 2.9 pg/mL (ref 2.3–4.2)

## 2017-05-13 LAB — HEMOGLOBIN A1C: Hgb A1c MFr Bld: 5.7 % (ref 4.6–6.5)

## 2017-05-13 LAB — TSH: TSH: 4.62 u[IU]/mL — AB (ref 0.35–4.50)

## 2017-05-13 LAB — T4, FREE: FREE T4: 0.89 ng/dL (ref 0.60–1.60)

## 2017-05-13 NOTE — Telephone Encounter (Signed)
-----   Message from Ellamae Sia sent at 05/09/2017 11:22 AM EST ----- Regarding: Lab orders for Monday, 12.17.18 Patient is scheduled for CPX labs, please order future labs, Thanks , Karna Christmas

## 2017-05-17 ENCOUNTER — Encounter: Payer: Self-pay | Admitting: Family Medicine

## 2017-05-17 ENCOUNTER — Ambulatory Visit (INDEPENDENT_AMBULATORY_CARE_PROVIDER_SITE_OTHER): Payer: Federal, State, Local not specified - PPO | Admitting: Family Medicine

## 2017-05-17 ENCOUNTER — Ambulatory Visit
Admission: RE | Admit: 2017-05-17 | Discharge: 2017-05-17 | Disposition: A | Discharge status: Home / Self Care | Payer: Federal, State, Local not specified - PPO | Source: Ambulatory Visit | Attending: Orthopedic Surgery | Admitting: Orthopedic Surgery

## 2017-05-17 ENCOUNTER — Other Ambulatory Visit: Payer: Self-pay

## 2017-05-17 VITALS — BP 138/70 | HR 73 | Temp 98.3°F | Ht 66.0 in | Wt 232.0 lb

## 2017-05-17 DIAGNOSIS — G8929 Other chronic pain: Secondary | ICD-10-CM

## 2017-05-17 DIAGNOSIS — I10 Essential (primary) hypertension: Secondary | ICD-10-CM

## 2017-05-17 DIAGNOSIS — Z0000 Encounter for general adult medical examination without abnormal findings: Secondary | ICD-10-CM | POA: Diagnosis not present

## 2017-05-17 DIAGNOSIS — D509 Iron deficiency anemia, unspecified: Secondary | ICD-10-CM

## 2017-05-17 DIAGNOSIS — R7303 Prediabetes: Secondary | ICD-10-CM | POA: Diagnosis not present

## 2017-05-17 DIAGNOSIS — M160 Bilateral primary osteoarthritis of hip: Secondary | ICD-10-CM

## 2017-05-17 DIAGNOSIS — M5440 Lumbago with sciatica, unspecified side: Secondary | ICD-10-CM | POA: Diagnosis not present

## 2017-05-17 DIAGNOSIS — E7800 Pure hypercholesterolemia, unspecified: Secondary | ICD-10-CM | POA: Diagnosis not present

## 2017-05-17 DIAGNOSIS — E039 Hypothyroidism, unspecified: Secondary | ICD-10-CM | POA: Diagnosis not present

## 2017-05-17 NOTE — Assessment & Plan Note (Signed)
Resolved

## 2017-05-17 NOTE — Assessment & Plan Note (Signed)
Well controlled. Continue current medication.  

## 2017-05-17 NOTE — Progress Notes (Signed)
Subjective:    Patient ID: Kristine [name], female    DOB: [birth year], 76 y.o.   MRN: [id number]  HPI   The patient presents for complete physical and review of chronic health problems. He/She also has the following acute concerns today:  Hypertension:   Good control  BP Readings from Last 3 Encounters:  05/17/17 138/70  03/14/17 140/74  11/20/16 (!) 154/86  Using medication without problems or lightheadedness:  None Chest pain with exertion: none Edema: none Short of breath: none Average home BPs: Other issues:  Elevated Cholesterol:  Improved, trig remain high Lab Results  Component Value Date   CHOL 162 05/13/2017   HDL 58.60 05/13/2017   LDLCALC 67 05/13/2017   LDLDIRECT 86.0 05/09/2015   TRIG 179.0 (H) 05/13/2017   CHOLHDL 3 05/13/2017  Using medications without problems: Muscle aches:  Diet compliance: healthy Exercise: minimal Other complaints:  Has upcoming MRI  For back pain per Dr. Maureen Ralphs.   prediabetes  Lab Results  Component Value Date   HGBA1C 5.7 05/13/2017    Hypothyroid  NMl free t3 and free t4 on levo 75 mcg daily Lab Results  Component Value Date   TSH 4.62 (H) 05/13/2017    Social History /Family History/Past Medical History reviewed in detail and updated in EMR if needed. Blood pressure 138/70, pulse 73, temperature 98.3 F (36.8 C), temperature source Oral, height 5\' 6"  (1.676 m), weight 232 lb (105.2 kg).   Review of Systems  Constitutional: Positive for fatigue. Negative for fever.  HENT: Negative for congestion.   Eyes: Negative for pain.  Respiratory: Negative for cough and shortness of breath.   Cardiovascular: Negative for chest pain, palpitations and leg swelling.  Gastrointestinal: Negative for abdominal pain.  Genitourinary: Negative for dysuria and vaginal bleeding.  Musculoskeletal: Positive for arthralgias, back pain and gait problem.  Neurological: Negative for syncope, light-headedness and headaches.    Psychiatric/Behavioral: Negative for dysphoric mood.       Objective:   Physical Exam  Constitutional: Vital signs are normal. She appears well-developed and well-nourished. She is cooperative.  Non-toxic appearance. She does not appear ill. No distress.  obese  HENT:  Head: Normocephalic.  Right Ear: Hearing, tympanic membrane, external ear and ear canal normal.  Left Ear: Hearing, tympanic membrane, external ear and ear canal normal.  Nose: Nose normal.  Eyes: Conjunctivae, EOM and lids are normal. Pupils are equal, round, and reactive to light. Lids are everted and swept, no foreign bodies found.  Neck: Trachea normal and normal range of motion. Neck supple. Carotid bruit is not present. No thyroid mass and no thyromegaly present.  Cardiovascular: Normal rate, regular rhythm, S1 normal, S2 normal, normal heart sounds and intact distal pulses. Exam reveals no gallop.  No murmur heard. Pulmonary/Chest: Effort normal and breath sounds normal. No respiratory distress. She has no wheezes. She has no rhonchi. She has no rales.  Abdominal: Soft. Normal appearance and bowel sounds are normal. She exhibits no distension, no fluid wave, no abdominal bruit and no mass. There is no hepatosplenomegaly. There is no tenderness. There is no rebound, no guarding and no CVA tenderness. No hernia.  Lymphadenopathy:    She has no cervical adenopathy.    She has no axillary adenopathy.  Neurological: She is alert. She has normal strength. No cranial nerve deficit or sensory deficit.  Skin: Skin is warm, dry and intact. No rash noted.  Psychiatric: Her speech is normal and behavior is normal. Judgment normal. Her mood  appears not anxious. Cognition and memory are normal. She does not exhibit a depressed mood.          Assessment & Plan:  The patient's preventative maintenance and recommended screening tests for an annual wellness exam were reviewed in full today. Brought up to date unless services  declined.  Counselled on the importance of diet, exercise, and its role in overall health and mortality. The patient's FH and SH was reviewed, including their home life, tobacco status, and drug and alcohol status.   Vaccines: uptodate Td, PNA, zoster. SE to flu in past refused Pap/DVE: Pap/DVE not indicated.  Asymptomatic. Mammo: nml 04/2017, plans continuing yearly Bone Density:2017, nml.. Plan repeat in 5 years Colon: nml 02/2008, Dr. Olevia Perches, repeat in 10 years: 2019.  Smoking Status: none

## 2017-05-17 NOTE — Assessment & Plan Note (Signed)
Stable control on current dose of levo.

## 2017-05-17 NOTE — Assessment & Plan Note (Signed)
Improved control LDL at goal. Trigs remain high . Encouraged exercise, weight loss, healthy eating habits.

## 2017-05-17 NOTE — Assessment & Plan Note (Signed)
Pending eval with MRi to consider spinal stenosis. Per Dr. Maureen Ralphs.

## 2017-05-17 NOTE — Assessment & Plan Note (Signed)
Low carb diet.  Lab Results  Component Value Date   HGBA1C 5.7 05/13/2017

## 2017-05-26 ENCOUNTER — Other Ambulatory Visit: Payer: Self-pay | Admitting: Family Medicine

## 2017-05-27 ENCOUNTER — Telehealth: Payer: Self-pay | Admitting: Podiatry

## 2017-05-27 NOTE — Telephone Encounter (Signed)
Called and left Caren Griffins at [facility] a voicemail that pt called to cancel her surgery due to back problems. Told her if she needed to speak to me to call me back directly at [phone].

## 2017-05-27 NOTE — Telephone Encounter (Signed)
Kristine [name], I'm scheduled for surgery on 06 June 2017. I have developed a problem with my back and I have started to undergo treatment for that. So it's not a good time for my right foot to be out of commission for a few weeks. I need to cancel my surgery. I don't know if I should reschedule my surgery or cancel it entirely. If you would like to call me back to determine what would be best my number is [phone]. Thank you. Good bye.

## 2017-05-28 HISTORY — PX: BACK SURGERY: SHX140

## 2017-06-05 ENCOUNTER — Telehealth: Payer: Self-pay | Admitting: *Deleted

## 2017-06-05 NOTE — Telephone Encounter (Signed)
"  I am calling to make sure you canceled my surgery that was scheduled for tomorrow.  I just had a strange feeling that it had not been canceled."  Yes, we have already canceled your surgery.  "Okay, I'll call back later to reschedule it once things are better."

## 2017-06-14 ENCOUNTER — Encounter: Payer: Federal, State, Local not specified - PPO | Admitting: Podiatry

## 2017-06-21 ENCOUNTER — Encounter: Payer: Federal, State, Local not specified - PPO | Admitting: Podiatry

## 2017-07-22 ENCOUNTER — Telehealth: Payer: Self-pay | Admitting: *Deleted

## 2017-07-22 NOTE — Telephone Encounter (Signed)
Copied from [place]. Topic: General - Other >> Jul 22, 2017  3:51 PM Kristine [name] wrote: Reason for CRM: Patient dropped off paperwork on Friday for pre-op clearance. Patient would like to know if she needs to be seen to have form completed? Please advise

## 2017-07-23 NOTE — Telephone Encounter (Signed)
Does she have pre op labs or EKG palnned? If no.. Make appt for pre-op. If yes AND no chest pain or sob.. Given I just did CPX, I will clear her. Let me know.

## 2017-07-23 NOTE — Telephone Encounter (Signed)
Pt called back today to to see if she needs to make an apt for the  surgical clearance form to be completed.  She is requesting Dr Rometta Emery nurse call her [phone]

## 2017-07-23 NOTE — Telephone Encounter (Addendum)
Spoke with Kristine [name].  She does not have pre op labs or EKG planned.  Surgical Clearance appointment scheduled with Dr. Diona Browner 07/24/2017 at 1:15 pm.

## 2017-07-24 ENCOUNTER — Encounter: Payer: Self-pay | Admitting: Family Medicine

## 2017-07-24 ENCOUNTER — Ambulatory Visit: Payer: Federal, State, Local not specified - PPO | Admitting: Family Medicine

## 2017-07-24 ENCOUNTER — Telehealth: Payer: Self-pay | Admitting: Family Medicine

## 2017-07-24 VITALS — BP 126/74 | HR 76 | Temp 98.5°F | Ht 66.0 in | Wt 234.5 lb

## 2017-07-24 DIAGNOSIS — I10 Essential (primary) hypertension: Secondary | ICD-10-CM | POA: Diagnosis not present

## 2017-07-24 DIAGNOSIS — E7800 Pure hypercholesterolemia, unspecified: Secondary | ICD-10-CM

## 2017-07-24 DIAGNOSIS — Z01818 Encounter for other preprocedural examination: Secondary | ICD-10-CM | POA: Diagnosis not present

## 2017-07-24 LAB — CBC WITH DIFFERENTIAL/PLATELET
BASOS ABS: 0 10*3/uL (ref 0.0–0.1)
Basophils Relative: 0.6 % (ref 0.0–3.0)
EOS ABS: 0.2 10*3/uL (ref 0.0–0.7)
Eosinophils Relative: 3.1 % (ref 0.0–5.0)
HCT: 41 % (ref 36.0–46.0)
Hemoglobin: 14.3 g/dL (ref 12.0–15.0)
LYMPHS ABS: 1.9 10*3/uL (ref 0.7–4.0)
Lymphocytes Relative: 27.3 % (ref 12.0–46.0)
MCHC: 34.9 g/dL (ref 30.0–36.0)
MCV: 85.5 fl (ref 78.0–100.0)
MONO ABS: 0.5 10*3/uL (ref 0.1–1.0)
MONOS PCT: 6.7 % (ref 3.0–12.0)
NEUTROS ABS: 4.2 10*3/uL (ref 1.4–7.7)
NEUTROS PCT: 62.3 % (ref 43.0–77.0)
PLATELETS: 221 10*3/uL (ref 150.0–400.0)
RBC: 4.8 Mil/uL (ref 3.87–5.11)
RDW: 13.4 % (ref 11.5–15.5)
WBC: 6.8 10*3/uL (ref 4.0–10.5)

## 2017-07-24 LAB — COMPREHENSIVE METABOLIC PANEL
ALK PHOS: 44 U/L (ref 39–117)
ALT: 14 U/L (ref 0–35)
AST: 12 U/L (ref 0–37)
Albumin: 4 g/dL (ref 3.5–5.2)
BILIRUBIN TOTAL: 0.6 mg/dL (ref 0.2–1.2)
BUN: 17 mg/dL (ref 6–23)
CO2: 30 meq/L (ref 19–32)
CREATININE: 0.59 mg/dL (ref 0.40–1.20)
Calcium: 9.5 mg/dL (ref 8.4–10.5)
Chloride: 103 mEq/L (ref 96–112)
GFR: 104.9 mL/min (ref >60.00)
GLUCOSE: 111 mg/dL — AB (ref 70–99)
Potassium: 3.9 mEq/L (ref 3.5–5.1)
Sodium: 139 mEq/L (ref 135–145)
Total Protein: 6.5 g/dL (ref 6.0–8.3)

## 2017-07-24 NOTE — Telephone Encounter (Signed)
Pt would like for you or Dr.Bedsole call her to answer a question concerning her upcoming procedure. Placed in Rx tower.

## 2017-07-24 NOTE — Progress Notes (Signed)
   Subjective:    Patient ID: Kristine [name], female    DOB: 28-Apr-1941, 77 y.o.   MRN: [id number]  HPI  77 year old female presents pre op eval.. She is having upcoming laminectomy.   PMH:  High cholesterol Lacunar infarction Hypothyroid  HTN  Anemia   She reoorts  no previous complication with past surgeries.  Last surgery was hip replacement.. No issues.  No recent chest pain, no SOB. Feels good except for back.  Good blood pressure control   2006  EKG nonspecific T.. Referred for stress test:  low risk  Blood pressure 126/74, pulse 76, temperature 98.5 F (36.9 C), temperature source Oral, height 5\' 6"  (1.676 m), weight 234 lb 8 oz (106.4 kg), SpO2 97 %.     Review of Systems  Constitutional: Negative for fatigue and fever.  HENT: Negative for congestion.   Eyes: Negative for pain.  Respiratory: Negative for cough and shortness of breath.   Cardiovascular: Negative for chest pain, palpitations and leg swelling.  Gastrointestinal: Negative for abdominal pain.  Genitourinary: Negative for dysuria and vaginal bleeding.  Musculoskeletal: Negative for back pain.  Neurological: Negative for syncope, light-headedness and headaches.  Psychiatric/Behavioral: Negative for dysphoric mood.      Objective:   Physical Exam  Constitutional: Vital signs are normal. She appears well-developed and well-nourished. She is cooperative.  Non-toxic appearance. She does not appear ill. No distress.  obese  HENT:  Head: Normocephalic.  Right Ear: Hearing, tympanic membrane, external ear and ear canal normal.  Left Ear: Hearing, tympanic membrane, external ear and ear canal normal.  Nose: Nose normal.  Open oropharynx  Eyes: Conjunctivae, EOM and lids are normal. Pupils are equal, round, and reactive to light. Lids are everted and swept, no foreign bodies found.  Neck: Trachea normal and normal range of motion. Neck supple. Carotid bruit is not present. No thyroid mass and no  thyromegaly present.  Cardiovascular: Normal rate, regular rhythm, S1 normal, S2 normal, normal heart sounds and intact distal pulses. Exam reveals no gallop.  No murmur heard. Pulmonary/Chest: Effort normal and breath sounds normal. No respiratory distress. She has no wheezes. She has no rhonchi. She has no rales.  Abdominal: Soft. Normal appearance and bowel sounds are normal. She exhibits no distension, no fluid wave, no abdominal bruit and no mass. There is no hepatosplenomegaly. There is no tenderness. There is no rebound, no guarding and no CVA tenderness. No hernia.  Lymphadenopathy:    She has no cervical adenopathy.    She has no axillary adenopathy.  Neurological: She is alert. She has normal strength. No cranial nerve deficit or sensory deficit.  Skin: Skin is warm, dry and intact. No rash noted.  Psychiatric: Her speech is normal and behavior is normal. Judgment normal. Her mood appears not anxious. Cognition and memory are normal. She does not exhibit a depressed mood.          Assessment & Plan:

## 2017-07-24 NOTE — Patient Instructions (Signed)
Please stop at the lab to have labs drawn.  

## 2017-07-24 NOTE — Telephone Encounter (Signed)
Form with patient's question placed in Dr. Rometta Emery in box to review.

## 2017-07-30 ENCOUNTER — Encounter: Payer: Self-pay | Admitting: Family Medicine

## 2017-08-07 ENCOUNTER — Encounter: Payer: Self-pay | Admitting: Family Medicine

## 2017-08-07 DIAGNOSIS — Z01818 Encounter for other preprocedural examination: Secondary | ICD-10-CM | POA: Insufficient documentation

## 2017-08-07 NOTE — Assessment & Plan Note (Signed)
Pt with stable EKG. No red flags. No further cardiac eval needed. Low risk for upcoming surgery.

## 2017-08-21 ENCOUNTER — Encounter (HOSPITAL_COMMUNITY): Payer: Self-pay

## 2017-08-21 ENCOUNTER — Encounter (HOSPITAL_COMMUNITY)
Admission: RE | Admit: 2017-08-21 | Discharge: 2017-08-21 | Disposition: A | Discharge status: Home / Self Care | Payer: Federal, State, Local not specified - PPO | Source: Ambulatory Visit | Attending: Orthopedic Surgery | Admitting: Orthopedic Surgery

## 2017-08-21 ENCOUNTER — Other Ambulatory Visit: Payer: Self-pay

## 2017-08-21 ENCOUNTER — Other Ambulatory Visit: Payer: Self-pay | Admitting: *Deleted

## 2017-08-21 DIAGNOSIS — Z01812 Encounter for preprocedural laboratory examination: Secondary | ICD-10-CM | POA: Diagnosis present

## 2017-08-21 HISTORY — DX: Benign neoplasm of peripheral nerves and autonomic nervous system, unspecified: D36.10

## 2017-08-21 HISTORY — DX: Hypothyroidism, unspecified: E03.9

## 2017-08-21 HISTORY — DX: Pure hypercholesterolemia, unspecified: E78.00

## 2017-08-21 LAB — CBC
HEMATOCRIT: 41.8 % (ref 36.0–46.0)
HEMOGLOBIN: 14.3 g/dL (ref 12.0–15.0)
MCH: 29.6 pg (ref 26.0–34.0)
MCHC: 34.2 g/dL (ref 30.0–36.0)
MCV: 86.5 fL (ref 78.0–100.0)
Platelets: 204 10*3/uL (ref 150–400)
RBC: 4.83 MIL/uL (ref 3.87–5.11)
RDW: 13.4 % (ref 11.5–15.5)
WBC: 6.6 10*3/uL (ref 4.0–10.5)

## 2017-08-21 LAB — SURGICAL PCR SCREEN
MRSA, PCR: NEGATIVE
Staphylococcus aureus: NEGATIVE

## 2017-08-21 LAB — BASIC METABOLIC PANEL
ANION GAP: 11 (ref 5–15)
BUN: 15 mg/dL (ref 6–20)
CALCIUM: 9.4 mg/dL (ref 8.9–10.3)
CO2: 21 mmol/L — AB (ref 22–32)
Chloride: 107 mmol/L (ref 101–111)
Creatinine, Ser: 0.54 mg/dL (ref 0.44–1.00)
GFR calc Af Amer: >60 mL/min (ref >60)
GFR calc non Af Amer: >60 mL/min (ref >60)
GLUCOSE: 92 mg/dL (ref 65–99)
Potassium: 4 mmol/L (ref 3.5–5.1)
Sodium: 139 mmol/L (ref 135–145)

## 2017-08-21 MED ORDER — LOSARTAN POTASSIUM 100 MG PO TABS: 100.0000 mg | ORAL_TABLET | Freq: Every day | ORAL | 1 refills | Status: DC

## 2017-08-21 MED ORDER — HYDROCHLOROTHIAZIDE 25 MG PO TABS: 25.0000 mg | ORAL_TABLET | Freq: Every day | ORAL | 1 refills | Status: DC

## 2017-08-21 NOTE — Progress Notes (Signed)
PCP - Dr. Diona Browner; clearance in [place] 08/07/2017 Cardiologist - patient denies  Chest x-ray - n/a EKG - 07/24/2017 Stress Test - 2006 in [place] ECHO - patient denies Cardiac Cath - patient denies  Sleep Study - patient denies  Aspirin Instructions: patient already stopped  Anesthesia review: yes, abnormal EKG; order per Dr. Rolena Infante  Patient denies shortness of breath, fever, cough and chest pain at PAT appointment   Patient verbalized understanding of instructions that were given to them at the PAT appointment. Patient was also instructed that they will need to review over the PAT instructions again at home before surgery.

## 2017-08-21 NOTE — Telephone Encounter (Signed)
Received fax from [facility] asking for 2 separate prescriptions for Losartan 100 mg and HCTZ 25 mg due to combination drug is on backorder.  Prescriptions sent [facility] to [facility] [place].

## 2017-08-21 NOTE — Pre-Procedure Instructions (Signed)
Kristine [name]  08/21/2017      [facility] [place] [place] [facility] [address] [place] [address] Phone: [phone] Fax: [phone]  [id number] [place], [address] [place] [address] Phone: [phone] Fax: [phone]    Your procedure is scheduled on 08/28/2017.  Report to [facility] Admitting at 0930 A.M.  Call this number if you have problems the morning of surgery:  [phone]   Remember:  Do not eat food or drink liquids after midnight.   Continue all medications as directed by your physician except follow these medication instructions before surgery below   Take these medicines the morning of surgery with A SIP OF WATER: Fexofenadine (Allegra) Fluticasone (Flonase) Nasal Spray - if needed Levothyroxine (Synthroid) Mirabegron ER (Myrbetriq) Premarin  7 days prior to surgery STOP taking any Sulindac (Clinoril), Aspirin (unless otherwise instructed by your surgeon), Aleve, Naproxen, Ibuprofen, Motrin, Advil, Goody's, BC's, all herbal medications, fish oil, and all vitamins.  Follow your doctors instructions regarding your Aspirin.  If no instructions were given by your doctor, then you will need to call the prescribing office office to get instructions.       Do not wear jewelry, make-up or nail polish.  Do not wear lotions, powders, or perfumes, or deodorant.  Do not shave 48 hours prior to surgery.    Do not bring valuables to the hospital.  [facility] is not responsible for any belongings or valuables.  Hearing Aids, eyeglasses, contacts, dentures or bridgework may not be worn into surgery.  Leave your suitcase in the car.  After surgery it may be brought to your room.  For patients admitted to the hospital, discharge time will be determined by your treatment team.  Patients discharged the day of surgery will not be  allowed to drive home.   Name and phone number of your driver:  Special instructions:   [facility]- Preparing For Surgery  Before surgery, you can play an important role. Because skin is not sterile, your skin needs to be as free of germs as possible. You can reduce the number of germs on your skin by washing with CHG (chlorahexidine gluconate) Soap before surgery.  CHG is an antiseptic cleaner which kills germs and bonds with the skin to continue killing germs even after washing.  Please do not use if you have an allergy to CHG or antibacterial soaps. If your skin becomes reddened/irritated stop using the CHG.  Do not shave (including legs and underarms) for at least 48 hours prior to first CHG shower. It is OK to shave your face.  Please follow these instructions carefully.   1. Shower the NIGHT BEFORE SURGERY and the MORNING OF SURGERY with CHG.   2. If you chose to wash your hair, wash your hair first as usual with your normal shampoo.  3. After you shampoo, rinse your hair and body thoroughly to remove the shampoo.  4. Use CHG as you would any other liquid soap. You can apply CHG directly to the skin and wash gently with a scrungie or a clean washcloth.   5. Apply the CHG Soap to your body ONLY FROM THE NECK DOWN.  Do not use on open wounds or open sores. Avoid contact with your eyes, ears, mouth and genitals (private parts). Wash Face and genitals (private parts)  with your normal soap.  6. Wash thoroughly, paying special attention to the area where your surgery will be performed.  7. Thoroughly rinse your body with warm water from the neck down.  8. DO NOT shower/wash with your normal soap after using and rinsing off the CHG Soap.  9. Pat yourself dry with a CLEAN TOWEL.  10. Wear CLEAN PAJAMAS to bed the night before surgery, wear comfortable clothes the morning of surgery  11. Place CLEAN SHEETS on your bed the night of your first shower and DO NOT SLEEP WITH  PETS.    Day of Surgery: Shower as stated above. Do not apply any deodorants/lotions. Please wear clean clothes to the hospital/surgery center.      Please read over the following fact sheets that you were given.

## 2017-08-22 NOTE — Progress Notes (Signed)
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 Never smoker. BMI 37  Medications include: ASA 81mg , levothyroxine, losartan-hctz, simvastatin  BP (!) 158/68   Pulse 67   Temp 36.5 C   Resp 20   Ht 5' 6.5" (1.689 m)   Wt 234 lb 11.2 oz (106.5 kg)   SpO2 97%   BMI 37.31 kg/m   Preoperative labs reviewed.    EKG 07/24/17: Sinus  Rhythm. Nonspecific T-abnormality.   If no changes, I anticipate pt can proceed with surgery as scheduled.   Willeen Cass, FNP-BC [facility] Short Stay Surgical Center/Anesthesiology Phone: [phone] 08/22/2017 2:13 PM

## 2017-08-23 NOTE — H&P (Addendum)
Patient ID: Kristine [name] MRN: [id number] DOB/AGE: Jul 15, 1940 77 y.o.  Admit date: (Not on file)  Admission Diagnoses:  Lumbar spinal stenosis and synovial cyst  HPI: Pleasant 77 year old female patient presents to the clinic for her H&P prior to out 3-5 lumbar decompression and excision of synovial cyst.  Patient overall reports history of good health.  Past Medical History: Past Medical History:  Diagnosis Date  . Allergy   . Anal fissure   . High cholesterol   . Hypertension   . Hypothyroidism   . 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 Thyroid disease     Surgical History: Past Surgical History:  Procedure Laterality Date  . ABDOMINAL HYSTERECTOMY    . BREAST BIOPSY    . BREAST EXCISIONAL BIOPSY Left 1980   benign  . DILATION AND CURETTAGE OF UTERUS    . HEMORROIDECTOMY    . HIP SURGERY  2011   L hip replacement  . LIPOMA EXCISION    . RECTAL POLYPECTOMY  2010  . RECTOCELE REPAIR    . TOTAL ABDOMINAL HYSTERECTOMY W/ BILATERAL SALPINGOOPHORECTOMY    . TUBAL LIGATION      Family History: Family History  Problem Relation Age of Onset  . Hyperlipidemia Mother   . Heart disease Mother        heart attack  . Kidney disease Father   . Cancer Sister        multiple myeloma  . Diabetes Brother     Social History: Social History   Socioeconomic History  . Marital status: Married    Spouse name: Not on file  . Number of children: Not on file  . Years of education: Not on file  . Highest education level: Not on file  Occupational History  . Not on file  Social Needs  . Financial resource strain: Not on file  . Food insecurity:    Worry: Not on file    Inability: Not on file  . Transportation needs:    Medical: Not on file    Non-medical: Not on file  Tobacco Use  . Smoking status: Never Smoker  . Smokeless tobacco: Never Used  Substance and Sexual Activity  . Alcohol use: No  . Drug use: No  . Sexual activity: Not on file  Lifestyle  .  Physical activity:    Days per week: Not on file    Minutes per session: Not on file  . Stress: Not on file  Relationships  . Social connections:    Talks on phone: Not on file    Gets together: Not on file    Attends religious service: Not on file    Active member of club or organization: Not on file    Attends meetings of clubs or organizations: Not on file    Relationship status: Not on file  . Intimate partner violence:    Fear of current or ex partner: Not on file    Emotionally abused: Not on file    Physically abused: Not on file    Forced sexual activity: Not on file  Other Topics Concern  . Not on file  Social History Narrative  . Not on file    Allergies: Demerol; Influenza vaccines; and Meperidine hcl  Medications: I have reviewed the patient's current medications.  Vital Signs: No data found.  Radiology: No results found.  Labs: Recent Labs    08/21/17 1459  WBC 6.6  RBC 4.83  HCT 41.8  PLT 204   Recent Labs    08/21/17 1459  NA 139  K 4.0  CL 107  CO2 21*  BUN 15  CREATININE 0.54  GLUCOSE 92  CALCIUM 9.4   No results for input(s): LABPT, INR in the last 72 hours.  Review of Systems: ROS  Physical Exam: There is no height or weight on file to calculate BMI.  Physical Exam  Constitutional: She is oriented to person, place, and time. She appears well-developed and well-nourished.  HENT:  Head: Normocephalic.  Eyes: Pupils are equal, round, and reactive to light.  Neck: Normal range of motion.  Cardiovascular: Normal rate, regular rhythm and normal heart sounds.  Respiratory: Effort normal and breath sounds normal.  GI: Soft. Bowel sounds are normal.  Neurological: She is alert and oriented to person, place, and time.  Skin: Skin is warm and dry.  Psychiatric: She has a normal mood and affect. Her behavior is normal. Judgment and thought content normal.   overweight Patient is alert 3 no shortness of breath no chest pain abdomen  soft and nontender tenderness to palpation of the lumbar paraspinals left worse than right flexion decreases pain extension lateral bending and twisting increases pain no sensation changes in bilateral lower extremities weakness noted bilateral lower extremities.  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  Risks and benefits of surgery were discussed with the patient. These include: Infection, bleeding, death, stroke, paralysis, ongoing or worse pain, need for additional surgery, leak of spinal fluid, adjacent segment degeneration requiring additional surgery, post-operative hematoma formation that can result in neurological compromise and the need for urgent/emergent re-operation. Loss in bowel and bladder control. Injury to major vessels that could result in the need for urgent abdominal surgery to stop bleeding. Risk of deep venous thrombosis (DVT) and the need for additional treatment. Recurrent disc herniation resulting in the need for revision surgery, which could include fusion surgery (utilizing instrumentation such as pedicle screws and intervertebral cages).  Additional risk: If instrumentation is used to address spinal stenosis there is a risk of migration, or breakage of that hardware that could require additional surgery.  Goal of surgery: Reduce (not eliminate) pain, and improve quality of life.   Kristine [name], PAC for Kristine Schools, MD [place] [phone]  Patient presented to the emergency room on August 26, 2017 incontinence of urine.  Patient was examined and there is no evidence to suggest a cauda equina syndrome.  Her pain was appropriately treated.  Her clinical exam  this morning is essentially unchanged.  She continues to have severe back pain radiating into the left lower extremity.  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  Patient denies perianal and saddle dysesthesias, as well as numbness in the perivaginal region.  I did review the MRI from 08/27/17.  IMPRESSION: 1. Enlarging LEFT L4-5 facet 11 x 13 x 16 mm synovial cyst superimposed on stable degenerative change of the lumbar spine. 2. Stable grade 1 L2-3 retrolisthesis and grade 1 L5-S1 anterolisthesis without spondylolysis. 3. Worsening severe canal stenosis L4-5. Stable moderate canal stenosis L3-4. 4. Neural foraminal narrowing L2-3 through L5-S1: Moderate to  severe on the LEFT at L4-5.  At this point time we will plan on moving forward with multilevel lumbar decompression and excision of the synovial cyst to address the spinal stenosis, neurogenic claudication, and radicular leg pain.  I reviewed the risks and benefits of surgery with the patient and her family and all their questions were addressed.

## 2017-08-26 ENCOUNTER — Encounter (HOSPITAL_COMMUNITY): Payer: Self-pay | Admitting: Emergency Medicine

## 2017-08-26 ENCOUNTER — Emergency Department (HOSPITAL_COMMUNITY)
Admission: EM | Admit: 2017-08-26 | Discharge: 2017-08-27 | Disposition: A | Discharge status: Home / Self Care | Payer: Federal, State, Local not specified - PPO | Attending: Emergency Medicine | Admitting: Emergency Medicine

## 2017-08-26 DIAGNOSIS — R350 Frequency of micturition: Secondary | ICD-10-CM | POA: Insufficient documentation

## 2017-08-26 DIAGNOSIS — E039 Hypothyroidism, unspecified: Secondary | ICD-10-CM | POA: Diagnosis not present

## 2017-08-26 DIAGNOSIS — Z79899 Other long term (current) drug therapy: Secondary | ICD-10-CM | POA: Insufficient documentation

## 2017-08-26 DIAGNOSIS — Z7982 Long term (current) use of aspirin: Secondary | ICD-10-CM | POA: Insufficient documentation

## 2017-08-26 DIAGNOSIS — I10 Essential (primary) hypertension: Secondary | ICD-10-CM | POA: Diagnosis not present

## 2017-08-26 LAB — URINALYSIS, ROUTINE W REFLEX MICROSCOPIC
Bilirubin Urine: NEGATIVE
Glucose, UA: NEGATIVE mg/dL
HGB URINE DIPSTICK: NEGATIVE
Ketones, ur: NEGATIVE mg/dL
LEUKOCYTES UA: NEGATIVE
NITRITE: NEGATIVE
Protein, ur: NEGATIVE mg/dL
SPECIFIC GRAVITY, URINE: 1.012 (ref 1.005–1.030)
pH: 5 (ref 5.0–8.0)

## 2017-08-26 MED ORDER — LORAZEPAM 1 MG PO TABS
0.5000 mg | ORAL_TABLET | Freq: Once | ORAL | Status: AC
  Administered 2017-08-26: 0.5 mg via ORAL
  Filled 2017-08-26: qty 1

## 2017-08-26 NOTE — ED Provider Notes (Signed)
Patient placed in Quick Look pathway, seen and evaluated   Chief Complaint: urinary incontinence  HPI:   Pt presenting with worsening urinary incontinence since today. Pt is scheduled for laminectomy of L3-L5 on Wednesday with Dr. Rolena Infante. She states she has had issues with dribbling urine, however was instructed to be evaluated if incontinence became worse. She states she called Dr. Rolena Infante' office today and spoke with his PA who recommended evaluation in ED. She states today, she barely had the urge to urinate before losing control of her bladder and urinated on herself. No recent injuries to back. Back pain has been gradually worsening last few weeks/days. Denies new weakness in legs. Denies other urinary sx.  ROS: +urinary incontinence, + back pain (chronic)  Physical Exam:   Gen: No distress  Neuro: Awake and Alert  Skin: Warm    Focused Exam: 5/5 strength BLE. Normal sensation, intact distal pulses.   Initiation of care has begun. The patient has been counseled on the process, plan, and necessity for staying for the completion/evaluation, and the remainder of the medical screening examination    Robinson, [place] N, PA-C 08/26/17 1938    Hayden Rasmussen, MD 08/27/17 1209

## 2017-08-26 NOTE — ED Notes (Signed)
Witnessed rectal exam performed by Dr. Randal Buba.

## 2017-08-26 NOTE — ED Provider Notes (Signed)
[facility]ERGENC[facility] DEPARTMENT Provider Note   CSN: [id number] Arrival date & time: 08/26/17  1812     History   Chief Complaint Chief Complaint  Patient presents with  . Urinary Frequency    HPI DORETHEA STRUBEL is a 77 y.o. female.  The history is provided by the patient.  Urinary Frequency  This is a new problem. The current episode started 6 to 12 hours ago. The problem occurs rarely. The problem has been resolved. Pertinent negatives include no chest pain, no abdominal pain, no headaches and no shortness of breath. Nothing aggravates the symptoms. Nothing relieves the symptoms. She has tried nothing for the symptoms. The treatment provided significant relief.  Told to come in with urine incontinence.  Has sensation to void.  No weakness in the legs,  Intact perineal sensation.  No loss of bowel continence.    Past Medical History:  Diagnosis Date  . Allergy   . Anal fissure   . High cholesterol   . Hypertension   . Hypothyroidism   . 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 Thyroid disease     Patient Active Problem List   Diagnosis Date Noted  . Pre-op evaluation 08/07/2017  . 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 BPPV (benign paroxysmal positional vertigo) 06/05/2016  . Urge incontinence 05/15/2016  . Prediabetes 05/12/2015  . Chronic low back pain 11/16/2011  . 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 Pure hypercholesterolemia 03/17/2009  . Chronic constipation 01/26/2009  . Hereditary and idiopathic peripheral neuropathy 11/12/2008  . Essential hypertension, benign 03/05/2007  . Hypothyroidism 02/21/2007  . OBESITY NOS 02/12/2007  . Iron deficiency anemia 02/05/2007  . COMMON MIGRAINE 02/05/2007  . ALLERGIC RHINITIS 02/05/2007  . OSTEOARTHRITIS 02/05/2007    Past Surgical History:  Procedure Laterality Date  . ABDOMINAL HYSTERECTOMY    . BREAST BIOPSY    . BREAST EXCISIONAL  BIOPSY Left 1980   benign  . DILATION AND CURETTAGE OF UTERUS    . HEMORROIDECTOMY    . HIP SURGERY  2011   L hip replacement  . LIPOMA EXCISION    . RECTAL POLYPECTOMY  2010  . RECTOCELE REPAIR    . TOTAL ABDOMINAL HYSTERECTOMY W/ BILATERAL SALPINGOOPHORECTOMY    . TUBAL LIGATION       OB History   None      Home Medications    Prior to Admission medications   Medication Sig Start Date End Date Taking? Authorizing Provider  Ca Carbonate-Mag Hydroxide (ROLAIDS PO) Take 1,000 mg by mouth daily as needed (indigestion).   Yes [provider]  cyanocobalamin 2000 MCG tablet Take 2,000 mcg by mouth daily.   Yes [provider]  fexofenadine (ALLEGRA) 180 MG tablet Take 180 mg by mouth daily.    Yes [provider]  fluticasone (FLONASE) 50 MCG/ACT nasal spray Place 2 sprays into both nostrils daily as needed for allergies. Reported on 10/28/2015   Yes [provider]  ibuprofen (ADVIL,MOTRIN) 200 MG tablet Take 200 mg by mouth every 6 (six) hours as needed for moderate pain.   Yes [provider]  levothyroxine (SYNTHROID, LEVOTHROID) 75 MCG tablet TAKE 1 TABLET (75 MCG TOTAL) BY MOUTH DAILY BEFORE BREAKFAST. 05/27/17  Yes Bedsole, Amy E, MD  losartan-hydrochlorothiazide (HYZAAR) 100-25 MG tablet Take 1 tablet by mouth daily.   Yes [provider]  mirabegron ER (MYRBETRIQ) 25 MG TB24 tablet Take 25 mg  by mouth daily as needed (urinary frequency).   Yes [provider]  montelukast (SINGULAIR) 10 MG tablet TAKE 1 TABLET AT BEDTIME Patient taking differently: TAKE 1 TABLET (66m)  AT BEDTIME 04/15/17  Yes Bedsole, Amy E, MD  Omega-3 Fatty Acids (OMEGA-3 FISH OIL) 1200 MG CAPS Take 3 capsules by mouth 2 (two) times daily.   Yes [provider]  polyethylene glycol (MIRALAX / GLYCOLAX) packet Take 17 g by mouth at bedtime.    Yes [provider]  PREMARIN 0.625 MG tablet TAKE 1 TABLET DAILY Patient taking  differently: TAKE 1 TABLET (.6234m DAILY 04/15/17  Yes Bedsole, Amy E, MD  simvastatin (ZOCOR) 40 MG tablet TAKE 1 TABLET BY MOUTH ONCE A DAY Patient taking differently: TAKE 1 TABLET (4070mBY MOUTH ONCE IN THE EVENING 04/29/17  Yes Bedsole, Amy E, MD  sulindac (CLINORIL) 150 MG tablet Take 1 tablet (150 mg total) by mouth 2 (two) times daily. 04/29/17  Yes Bedsole, Amy E, MD  ALPRAZolam (XANAX) 0.25 MG tablet Take 1-2 tablets (0.25-0.5 mg total) by mouth daily as needed (prior to MRI). Patient not taking: Reported on 08/26/2017 01/10/17   BedJinny SandersD  amoxicillin (AMOXIL) 500 MG capsule Take 4 tablets prior to procedure Patient not taking: Reported on 08/26/2017 01/04/12   BedJinny SandersD  aspirin 81 MG tablet Take 81 mg by mouth daily.    [provider]  hydrochlorothiazide (HYDRODIURIL) 25 MG tablet Take 1 tablet (25 mg total) by mouth daily. Patient not taking: Reported on 08/26/2017 08/21/17   BedJinny SandersD  losartan (COZAAR) 100 MG tablet Take 1 tablet (100 mg total) by mouth daily. Patient not taking: Reported on 08/26/2017 08/21/17   BedJinny SandersD    Family History Family History  Problem Relation Age of Onset  . Hyperlipidemia Mother   . Heart disease Mother        heart attack  . Kidney disease Father   . Cancer Sister        multiple myeloma  . Diabetes Brother     Social History Social History   Tobacco Use  . Smoking status: Never Smoker  . Smokeless tobacco: Never Used  Substance Use Topics  . Alcohol use: No  . Drug use: No     Allergies   Corn-containing products; Demerol; Influenza vaccines; and Meperidine hcl   Review of Systems Review of Systems  Respiratory: Negative for shortness of breath.   Cardiovascular: Negative for chest pain.  Gastrointestinal: Negative for abdominal pain.  Genitourinary: Positive for frequency. Negative for enuresis and flank pain.  Musculoskeletal: Positive for back pain. Negative for neck pain and neck  stiffness.  Neurological: Negative for weakness, numbness and headaches.  All other systems reviewed and are negative.    Physical Exam Updated Vital Signs BP (!) 154/68 (BP Location: Right Arm)   Pulse 70   Temp 98.1 F (36.7 C) (Oral)   Resp 16   SpO2 98%   Physical Exam  Constitutional: She is oriented to person, place, and time. She appears well-developed and well-nourished. No distress.  HENT:  Head: Normocephalic and atraumatic.  Mouth/Throat: No oropharyngeal exudate.  Eyes: Pupils are equal, round, and reactive to light. Conjunctivae are normal.  Neck: Normal range of motion. Neck supple.  Cardiovascular: Normal rate, regular rhythm, normal heart sounds and intact distal pulses.  Pulmonary/Chest: Effort normal and breath sounds normal. No stridor. She has no wheezes. She has no rales.  Abdominal: Soft. Bowel sounds are normal. She exhibits no mass. There is no tenderness. There is no rebound and no guarding.  Genitourinary:  Genitourinary Comments: Intact rectal tone intact sensation   Musculoskeletal: Normal range of motion.  Neurological: She is alert and oriented to person, place, and time. She displays normal reflexes. No cranial nerve deficit or sensory deficit. She exhibits normal muscle tone. Coordination normal.  Skin: Skin is warm and dry. Capillary refill takes less than 2 seconds.  Psychiatric: She has a normal mood and affect.  Nursing note and vitals reviewed.    ED Treatments / Results  Labs (all labs ordered are listed, but only abnormal results are displayed) Results for orders placed or performed during the hospital encounter of 08/26/17  Urinalysis, Routine w reflex microscopic  Result Value Ref Range   Color, Urine YELLOW YELLOW   APPearance CLEAR CLEAR   Specific Gravity, Urine 1.012 1.005 - 1.030   pH 5.0 5.0 - 8.0   Glucose, UA NEGATIVE NEGATIVE mg/dL   Hgb urine dipstick NEGATIVE NEGATIVE   Bilirubin Urine NEGATIVE NEGATIVE   Ketones, ur  NEGATIVE NEGATIVE mg/dL   Protein, ur NEGATIVE NEGATIVE mg/dL   Nitrite NEGATIVE NEGATIVE   Leukocytes, UA NEGATIVE NEGATIVE   No results found.  Radiology Results for orders placed or performed during the hospital encounter of 08/26/17  Urinalysis, Routine w reflex microscopic  Result Value Ref Range   Color, Urine YELLOW YELLOW   APPearance CLEAR CLEAR   Specific Gravity, Urine 1.012 1.005 - 1.030   pH 5.0 5.0 - 8.0   Glucose, UA NEGATIVE NEGATIVE mg/dL   Hgb urine dipstick NEGATIVE NEGATIVE   Bilirubin Urine NEGATIVE NEGATIVE   Ketones, ur NEGATIVE NEGATIVE mg/dL   Protein, ur NEGATIVE NEGATIVE mg/dL   Nitrite NEGATIVE NEGATIVE   Leukocytes, UA NEGATIVE NEGATIVE   Mr Lumbar Spine Wo Contrast  Result Date: 08/27/2017 CLINICAL DATA:  Worsening urinary incontinence. Progressive back pain. EXAM: MRI LUMBAR SPINE WITHOUT CONTRAST TECHNIQUE: Multiplanar, multisequence MR imaging of the lumbar spine was performed. No intravenous contrast was administered. COMPARISON:  MRI lumbar spine May 17, 2017 FINDINGS: SEGMENTATION: For the purposes of this report, the last well-formed intervertebral disc is reported as L5-S1. ALIGNMENT: Maintained lumbar lordosis. Stable grade 1 L2-3 retrolisthesis and grade 1 L5-S1 anterolisthesis. No spondylolysis. VERTEBRAE:Vertebral bodies are intact. Stable severe L2-3 disc height loss with moderate subacute discogenic endplate changes. Mild disc desiccation all lumbar levels with multilevel mild chronic discogenic endplate changes. No acute or abnormal bone marrow signal. CONUS MEDULLARIS AND CAUDA EQUINA: Conus medullaris terminates at T12-L1 and demonstrates normal morphology and signal characteristics. Cauda equina is normal. 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 PARASPINAL AND OTHER SOFT TISSUES: Included prevertebral and paraspinal soft tissues are nonacute. DISC LEVELS: L1-2: Annular bulging and mild facet arthropathy. No canal stenosis or neural  foraminal narrowing. L2-3: Retrolisthesis. Stable small broad-based disc bulge and moderate RIGHT subarticular to extraforaminal disc protrusion contacting the exited RIGHT L2 nerve. Mild facet arthropathy and ligamentum flavum redundancy without canal stenosis. Mild RIGHT neural foraminal narrowing. L3-4: Stable 4 mm broad-based disc bulge asymmetric to the LEFT. Severe facet arthropathy and ligamentum flavum redundancy with similar facet effusions. Moderate canal stenosis, unchanged. Mild bilateral neural foraminal narrowing. L4-5: Stable 4 mm broad-based disc bulge and small LEFT subarticular disc extrusion. Severe facet arthropathy and ligamentum flavum redundancy with stable facet effusions. LEFT facet 11 x 13 x 16 mm synovial cyst was 9 x 10 x 12 mm,  within central posterior spinal canal. Severe canal stenosis worse than prior imaging. Mild RIGHT and moderate to severe similar neural foraminal narrowing. L5-S1: Anterolisthesis. Annular bulging with severe facet arthropathy, trace reactive effusions. No canal stenosis. Mild LEFT neural foraminal narrowing. IMPRESSION: 1. Enlarging LEFT L4-5 facet 11 x 13 x 16 mm synovial cyst superimposed on stable degenerative change of the lumbar spine. 2. Stable grade 1 L2-3 retrolisthesis and grade 1 L5-S1 anterolisthesis without spondylolysis. 3. Worsening severe canal stenosis L4-5. Stable moderate canal stenosis L3-4. 4. Neural foraminal narrowing L2-3 through L5-S1: Moderate to severe on the LEFT at L4-5. Electronically Signed   By: Elon Alas M.D.   On: 08/27/2017 01:06    Procedures Procedures (including critical care time)    Final Clinical Impressions(s) / ED Diagnoses   No signs of cauda equina or conus syndrome.  Stable for discharge.   Return for weakness, numbness, changes in vision or speech, fevers >100.4 unrelieved by medication, shortness of breath, intractable vomiting, or diarrhea, abdominal pain, Inability to tolerate liquids or food,  cough, altered mental status or any concerns. No signs of systemic illness or infection. The patient is nontoxic-appearing on exam and vital signs are within normal limits.   I have reviewed the triage vital signs and the nursing notes. Pertinent labs &imaging results that were available during my care of the patient were reviewed by me and considered in my medical decision making (see chart for details).  After history, exam, and medical workup I feel the patient has been appropriately medically screened and is safe for discharge home. Pertinent diagnoses were discussed with the patient. Patient was given return precautions.    Leilana Mcquire, MD 08/27/17 [phone]

## 2017-08-26 NOTE — ED Notes (Addendum)
Spoke with Dr. Alvan Dame who states this is his partners patient and he requests to updated on patient's plan/ status after evaluated by EDP. [phone]

## 2017-08-26 NOTE — ED Triage Notes (Signed)
Pt states she is scheduled to have surgery for spinal stenosis on Wednesday. Pt c/o urinary urgency/frequency with a "partial" loss of bladder control today at 1730. Pt has no other complaints at this time.

## 2017-08-27 ENCOUNTER — Emergency Department (HOSPITAL_COMMUNITY): Payer: Federal, State, Local not specified - PPO

## 2017-08-27 ENCOUNTER — Encounter: Payer: Self-pay | Admitting: Family Medicine

## 2017-08-27 MED ORDER — LEVOTHYROXINE SODIUM 75 MCG PO TABS: ORAL_TABLET | ORAL | 2 refills | Status: DC

## 2017-08-27 NOTE — ED Notes (Signed)
Patient transported to MRI 

## 2017-08-28 ENCOUNTER — Ambulatory Visit (HOSPITAL_COMMUNITY): Payer: Federal, State, Local not specified - PPO | Admitting: Emergency Medicine

## 2017-08-28 ENCOUNTER — Ambulatory Visit (HOSPITAL_COMMUNITY): Payer: Federal, State, Local not specified - PPO | Admitting: Certified Registered"

## 2017-08-28 ENCOUNTER — Encounter (HOSPITAL_COMMUNITY): Payer: Self-pay | Admitting: Urology

## 2017-08-28 ENCOUNTER — Ambulatory Visit (HOSPITAL_COMMUNITY): Payer: Federal, State, Local not specified - PPO

## 2017-08-28 ENCOUNTER — Ambulatory Visit (HOSPITAL_COMMUNITY)
Admission: RE | Disposition: A | Discharge status: Home / Self Care | Payer: Self-pay | Source: Ambulatory Visit | Attending: Orthopedic Surgery

## 2017-08-28 ENCOUNTER — Observation Stay (HOSPITAL_COMMUNITY)
Admission: RE | Admit: 2017-08-28 | Discharge: 2017-08-30 | Disposition: A | Discharge status: Home / Self Care | Payer: Federal, State, Local not specified - PPO | Source: Ambulatory Visit | Attending: Orthopedic Surgery | Admitting: Orthopedic Surgery

## 2017-08-28 DIAGNOSIS — Z7982 Long term (current) use of aspirin: Secondary | ICD-10-CM | POA: Insufficient documentation

## 2017-08-28 DIAGNOSIS — E039 Hypothyroidism, unspecified: Secondary | ICD-10-CM | POA: Diagnosis not present

## 2017-08-28 DIAGNOSIS — Z885 Allergy status to narcotic agent status: Secondary | ICD-10-CM | POA: Insufficient documentation

## 2017-08-28 DIAGNOSIS — Z833 Family history of diabetes mellitus: Secondary | ICD-10-CM | POA: Diagnosis not present

## 2017-08-28 DIAGNOSIS — I700 Atherosclerosis of aorta: Secondary | ICD-10-CM | POA: Diagnosis not present

## 2017-08-28 DIAGNOSIS — Z888 Allergy status to other drugs, medicaments and biological substances status: Secondary | ICD-10-CM | POA: Diagnosis not present

## 2017-08-28 DIAGNOSIS — M5126 Other intervertebral disc displacement, lumbar region: Secondary | ICD-10-CM | POA: Diagnosis not present

## 2017-08-28 DIAGNOSIS — Z419 Encounter for procedure for purposes other than remedying health state, unspecified: Secondary | ICD-10-CM

## 2017-08-28 DIAGNOSIS — E7800 Pure hypercholesterolemia, unspecified: Secondary | ICD-10-CM | POA: Insufficient documentation

## 2017-08-28 DIAGNOSIS — M4056 Lordosis, unspecified, lumbar region: Secondary | ICD-10-CM | POA: Diagnosis not present

## 2017-08-28 DIAGNOSIS — R32 Unspecified urinary incontinence: Secondary | ICD-10-CM | POA: Diagnosis not present

## 2017-08-28 DIAGNOSIS — Z79899 Other long term (current) drug therapy: Secondary | ICD-10-CM | POA: Diagnosis not present

## 2017-08-28 DIAGNOSIS — Z8249 Family history of ischemic heart disease and other diseases of the circulatory system: Secondary | ICD-10-CM | POA: Insufficient documentation

## 2017-08-28 DIAGNOSIS — Z8719 Personal history of other diseases of the digestive system: Secondary | ICD-10-CM | POA: Diagnosis not present

## 2017-08-28 DIAGNOSIS — M48061 Spinal stenosis, lumbar region without neurogenic claudication: Secondary | ICD-10-CM | POA: Diagnosis present

## 2017-08-28 DIAGNOSIS — I10 Essential (primary) hypertension: Secondary | ICD-10-CM | POA: Insufficient documentation

## 2017-08-28 DIAGNOSIS — Z90710 Acquired absence of both cervix and uterus: Secondary | ICD-10-CM | POA: Diagnosis not present

## 2017-08-28 DIAGNOSIS — M7138 Other bursal cyst, other site: Secondary | ICD-10-CM | POA: Diagnosis not present

## 2017-08-28 DIAGNOSIS — M5116 Intervertebral disc disorders with radiculopathy, lumbar region: Secondary | ICD-10-CM | POA: Insufficient documentation

## 2017-08-28 DIAGNOSIS — K5900 Constipation, unspecified: Secondary | ICD-10-CM | POA: Diagnosis not present

## 2017-08-28 DIAGNOSIS — Z841 Family history of disorders of kidney and ureter: Secondary | ICD-10-CM | POA: Insufficient documentation

## 2017-08-28 DIAGNOSIS — Z808 Family history of malignant neoplasm of other organs or systems: Secondary | ICD-10-CM | POA: Insufficient documentation

## 2017-08-28 DIAGNOSIS — Z887 Allergy status to serum and vaccine status: Secondary | ICD-10-CM | POA: Diagnosis not present

## 2017-08-28 DIAGNOSIS — Z9889 Other specified postprocedural states: Secondary | ICD-10-CM

## 2017-08-28 DIAGNOSIS — M48062 Spinal stenosis, lumbar region with neurogenic claudication: Secondary | ICD-10-CM | POA: Diagnosis not present

## 2017-08-28 HISTORY — PX: LUMBAR LAMINECTOMY/DECOMPRESSION MICRODISCECTOMY: SHX5026

## 2017-08-28 SURGERY — LUMBAR LAMINECTOMY/DECOMPRESSION MICRODISCECTOMY 2 LEVELS
Anesthesia: General

## 2017-08-28 MED ORDER — FENTANYL CITRATE (PF) 250 MCG/5ML IJ SOLN
INTRAMUSCULAR | Status: AC
  Filled 2017-08-28: qty 5

## 2017-08-28 MED ORDER — LIDOCAINE 2% (20 MG/ML) 5 ML SYRINGE
INTRAMUSCULAR | Status: DC | PRN
  Administered 2017-08-28: 100 mg via INTRAVENOUS

## 2017-08-28 MED ORDER — SUGAMMADEX SODIUM 200 MG/2ML IV SOLN
INTRAVENOUS | Status: DC | PRN
  Administered 2017-08-28: 200 mg via INTRAVENOUS

## 2017-08-28 MED ORDER — METHOCARBAMOL 500 MG PO TABS
500.0000 mg | ORAL_TABLET | Freq: Four times a day (QID) | ORAL | Status: DC | PRN
  Administered 2017-08-28 – 2017-08-30 (×7): 500 mg via ORAL
  Filled 2017-08-28 (×6): qty 1

## 2017-08-28 MED ORDER — MIDAZOLAM HCL 5 MG/5ML IJ SOLN: INTRAMUSCULAR | Status: DC | PRN

## 2017-08-28 MED ORDER — LEVOTHYROXINE SODIUM 75 MCG PO TABS
75.0000 ug | ORAL_TABLET | Freq: Every day | ORAL | Status: DC
  Administered 2017-08-29 – 2017-08-30 (×2): 75 ug via ORAL
  Filled 2017-08-28 (×2): qty 1

## 2017-08-28 MED ORDER — ROCURONIUM BROMIDE 10 MG/ML (PF) SYRINGE
PREFILLED_SYRINGE | INTRAVENOUS | Status: DC | PRN
  Administered 2017-08-28: 30 mg via INTRAVENOUS
  Administered 2017-08-28: 20 mg via INTRAVENOUS
  Administered 2017-08-28: 50 mg via INTRAVENOUS
  Administered 2017-08-28: 20 mg via INTRAVENOUS

## 2017-08-28 MED ORDER — SODIUM CHLORIDE 0.9 % IV SOLN: 250.0000 mL | INTRAVENOUS | Status: DC

## 2017-08-28 MED ORDER — HYDROCHLOROTHIAZIDE 25 MG PO TABS: 25.0000 mg | ORAL_TABLET | Freq: Every day | ORAL | Status: DC

## 2017-08-28 MED ORDER — ONDANSETRON HCL 4 MG/2ML IJ SOLN
INTRAMUSCULAR | Status: AC
  Filled 2017-08-28: qty 2

## 2017-08-28 MED ORDER — 0.9 % SODIUM CHLORIDE (POUR BTL) OPTIME
TOPICAL | Status: DC | PRN
  Administered 2017-08-28: 1000 mL

## 2017-08-28 MED ORDER — METHOCARBAMOL 1000 MG/10ML IJ SOLN
500.0000 mg | Freq: Four times a day (QID) | INTRAVENOUS | Status: DC | PRN
  Filled 2017-08-28: qty 5

## 2017-08-28 MED ORDER — ONDANSETRON 4 MG PO TBDP: 4.0000 mg | ORAL_TABLET | Freq: Three times a day (TID) | ORAL | 0 refills | Status: DC | PRN

## 2017-08-28 MED ORDER — OXYCODONE-ACETAMINOPHEN 10-325 MG PO TABS: 1.0000 | ORAL_TABLET | ORAL | 0 refills | Status: AC | PRN

## 2017-08-28 MED ORDER — FENTANYL CITRATE (PF) 100 MCG/2ML IJ SOLN
INTRAMUSCULAR | Status: AC
  Filled 2017-08-28: qty 2

## 2017-08-28 MED ORDER — LACTATED RINGERS IV SOLN: INTRAVENOUS | Status: DC

## 2017-08-28 MED ORDER — HYDROMORPHONE HCL 1 MG/ML IJ SOLN
INTRAMUSCULAR | Status: AC
  Filled 2017-08-28: qty 1

## 2017-08-28 MED ORDER — LACTATED RINGERS IV SOLN
INTRAVENOUS | Status: DC | PRN
  Administered 2017-08-28 (×2): via INTRAVENOUS

## 2017-08-28 MED ORDER — MONTELUKAST SODIUM 10 MG PO TABS
10.0000 mg | ORAL_TABLET | Freq: Every day | ORAL | Status: DC
  Administered 2017-08-28 – 2017-08-29 (×2): 10 mg via ORAL
  Filled 2017-08-28 (×2): qty 1

## 2017-08-28 MED ORDER — HEMOSTATIC AGENTS (NO CHARGE) OPTIME
TOPICAL | Status: DC | PRN
  Administered 2017-08-28 (×3): 1 via TOPICAL

## 2017-08-28 MED ORDER — CEFAZOLIN SODIUM-DEXTROSE 2-4 GM/100ML-% IV SOLN
INTRAVENOUS | Status: AC
  Filled 2017-08-28: qty 100

## 2017-08-28 MED ORDER — FLUTICASONE PROPIONATE 50 MCG/ACT NA SUSP
2.0000 | Freq: Every day | NASAL | Status: DC | PRN
  Filled 2017-08-28: qty 16

## 2017-08-28 MED ORDER — BUPIVACAINE-EPINEPHRINE (PF) 0.25% -1:200000 IJ SOLN
INTRAMUSCULAR | Status: AC
  Filled 2017-08-28: qty 30

## 2017-08-28 MED ORDER — DEXAMETHASONE SODIUM PHOSPHATE 10 MG/ML IJ SOLN
INTRAMUSCULAR | Status: DC | PRN
  Administered 2017-08-28: 10 mg via INTRAVENOUS

## 2017-08-28 MED ORDER — ARTIFICIAL TEARS OPHTHALMIC OINT
TOPICAL_OINTMENT | OPHTHALMIC | Status: AC
  Filled 2017-08-28: qty 3.5

## 2017-08-28 MED ORDER — POLYETHYLENE GLYCOL 3350 17 G PO PACK
17.0000 g | PACK | Freq: Every day | ORAL | Status: DC
  Administered 2017-08-29: 17 g via ORAL
  Filled 2017-08-28: qty 1

## 2017-08-28 MED ORDER — METHOCARBAMOL 500 MG PO TABS: 500.0000 mg | ORAL_TABLET | Freq: Three times a day (TID) | ORAL | 0 refills | Status: DC

## 2017-08-28 MED ORDER — DOCUSATE SODIUM 100 MG PO CAPS
100.0000 mg | ORAL_CAPSULE | Freq: Two times a day (BID) | ORAL | Status: DC
  Administered 2017-08-28 – 2017-08-29 (×3): 100 mg via ORAL
  Filled 2017-08-28 (×3): qty 1

## 2017-08-28 MED ORDER — CEFAZOLIN SODIUM-DEXTROSE 2-4 GM/100ML-% IV SOLN
2.0000 g | Freq: Three times a day (TID) | INTRAVENOUS | Status: AC
  Administered 2017-08-28 – 2017-08-29 (×2): 2 g via INTRAVENOUS
  Filled 2017-08-28 (×2): qty 100

## 2017-08-28 MED ORDER — THROMBIN 20000 UNITS EX SOLR
CUTANEOUS | Status: AC
  Filled 2017-08-28: qty 20000

## 2017-08-28 MED ORDER — SURGIFOAM 100 EX MISC
CUTANEOUS | Status: DC | PRN
  Administered 2017-08-28: 13:00:00 via TOPICAL

## 2017-08-28 MED ORDER — BUPIVACAINE-EPINEPHRINE (PF) 0.25% -1:200000 IJ SOLN
INTRAMUSCULAR | Status: DC | PRN
  Administered 2017-08-28: 10 mL via PERINEURAL

## 2017-08-28 MED ORDER — LOSARTAN POTASSIUM-HCTZ 100-25 MG PO TABS: 1.0000 | ORAL_TABLET | Freq: Every day | ORAL | Status: DC

## 2017-08-28 MED ORDER — ONDANSETRON HCL 4 MG PO TABS
4.0000 mg | ORAL_TABLET | Freq: Four times a day (QID) | ORAL | Status: DC | PRN
  Administered 2017-08-29: 4 mg via ORAL
  Filled 2017-08-28: qty 1

## 2017-08-28 MED ORDER — MEPERIDINE HCL 50 MG/ML IJ SOLN
6.2500 mg | INTRAMUSCULAR | Status: DC | PRN
Start: 2017-08-28 — End: 2017-08-28

## 2017-08-28 MED ORDER — PHENYLEPHRINE 40 MCG/ML (10ML) SYRINGE FOR IV PUSH (FOR BLOOD PRESSURE SUPPORT)
PREFILLED_SYRINGE | INTRAVENOUS | Status: DC | PRN
  Administered 2017-08-28: 80 ug via INTRAVENOUS

## 2017-08-28 MED ORDER — ALPRAZOLAM 0.25 MG PO TABS: 0.2500 mg | ORAL_TABLET | Freq: Every day | ORAL | Status: DC | PRN

## 2017-08-28 MED ORDER — LORATADINE 10 MG PO TABS
10.0000 mg | ORAL_TABLET | Freq: Every day | ORAL | Status: DC
Start: 2017-08-29 — End: 2017-08-30
  Administered 2017-08-29 – 2017-08-30 (×2): 10 mg via ORAL
  Filled 2017-08-28 (×2): qty 1

## 2017-08-28 MED ORDER — ACETAMINOPHEN 650 MG RE SUPP: 650.0000 mg | RECTAL | Status: DC | PRN

## 2017-08-28 MED ORDER — PROPOFOL 10 MG/ML IV BOLUS
INTRAVENOUS | Status: DC | PRN
  Administered 2017-08-28: 120 mg via INTRAVENOUS

## 2017-08-28 MED ORDER — OXYCODONE HCL 5 MG PO TABS: 5.0000 mg | ORAL_TABLET | ORAL | Status: DC | PRN

## 2017-08-28 MED ORDER — HYDROMORPHONE HCL 1 MG/ML IJ SOLN
0.2500 mg | INTRAMUSCULAR | Status: DC | PRN
  Administered 2017-08-28 (×4): 0.5 mg via INTRAVENOUS

## 2017-08-28 MED ORDER — ONDANSETRON HCL 4 MG/2ML IJ SOLN: 4.0000 mg | Freq: Once | INTRAMUSCULAR | Status: DC | PRN

## 2017-08-28 MED ORDER — PHENOL 1.4 % MT LIQD
1.0000 | OROMUCOSAL | Status: DC | PRN
  Administered 2017-08-29: 1 via OROMUCOSAL
  Filled 2017-08-28: qty 177

## 2017-08-28 MED ORDER — SIMVASTATIN 20 MG PO TABS
40.0000 mg | ORAL_TABLET | Freq: Every day | ORAL | Status: DC
  Administered 2017-08-28 – 2017-08-29 (×2): 40 mg via ORAL
  Filled 2017-08-28 (×2): qty 2

## 2017-08-28 MED ORDER — HYDROCHLOROTHIAZIDE 25 MG PO TABS
25.0000 mg | ORAL_TABLET | Freq: Every day | ORAL | Status: DC
  Administered 2017-08-28 – 2017-08-29 (×2): 25 mg via ORAL
  Filled 2017-08-28 (×2): qty 1

## 2017-08-28 MED ORDER — POLYETHYLENE GLYCOL 3350 17 G PO PACK
17.0000 g | PACK | Freq: Every day | ORAL | Status: DC | PRN
  Administered 2017-08-29: 17 g via ORAL
  Filled 2017-08-28: qty 1

## 2017-08-28 MED ORDER — METHOCARBAMOL 500 MG PO TABS
ORAL_TABLET | ORAL | Status: AC
  Filled 2017-08-28: qty 1

## 2017-08-28 MED ORDER — OXYCODONE HCL 5 MG PO TABS
ORAL_TABLET | ORAL | Status: AC
  Filled 2017-08-28: qty 2

## 2017-08-28 MED ORDER — PROPOFOL 10 MG/ML IV BOLUS
INTRAVENOUS | Status: AC
  Filled 2017-08-28: qty 20

## 2017-08-28 MED ORDER — SODIUM CHLORIDE 0.9% FLUSH: 3.0000 mL | INTRAVENOUS | Status: DC | PRN

## 2017-08-28 MED ORDER — MORPHINE SULFATE (PF) 4 MG/ML IV SOLN: 1.0000 mg | INTRAVENOUS | Status: DC | PRN

## 2017-08-28 MED ORDER — ONDANSETRON HCL 4 MG/2ML IJ SOLN
INTRAMUSCULAR | Status: DC | PRN
  Administered 2017-08-28: 4 mg via INTRAVENOUS

## 2017-08-28 MED ORDER — PHENYLEPHRINE 40 MCG/ML (10ML) SYRINGE FOR IV PUSH (FOR BLOOD PRESSURE SUPPORT)
PREFILLED_SYRINGE | INTRAVENOUS | Status: AC
  Filled 2017-08-28: qty 10

## 2017-08-28 MED ORDER — LOSARTAN POTASSIUM 50 MG PO TABS: 100.0000 mg | ORAL_TABLET | Freq: Every day | ORAL | Status: DC

## 2017-08-28 MED ORDER — LIDOCAINE HCL (CARDIAC) 20 MG/ML IV SOLN
INTRAVENOUS | Status: AC
  Filled 2017-08-28: qty 5

## 2017-08-28 MED ORDER — DEXAMETHASONE SODIUM PHOSPHATE 10 MG/ML IJ SOLN
INTRAMUSCULAR | Status: AC
  Filled 2017-08-28: qty 1

## 2017-08-28 MED ORDER — SODIUM CHLORIDE 0.9% FLUSH: 3.0000 mL | Freq: Two times a day (BID) | INTRAVENOUS | Status: DC

## 2017-08-28 MED ORDER — MAGNESIUM CITRATE PO SOLN
1.0000 | Freq: Once | ORAL | Status: AC | PRN
  Administered 2017-08-29: 1 via ORAL
  Filled 2017-08-28: qty 296

## 2017-08-28 MED ORDER — ROCURONIUM BROMIDE 10 MG/ML (PF) SYRINGE
PREFILLED_SYRINGE | INTRAVENOUS | Status: AC
  Filled 2017-08-28: qty 5

## 2017-08-28 MED ORDER — LOSARTAN POTASSIUM 50 MG PO TABS
100.0000 mg | ORAL_TABLET | Freq: Every day | ORAL | Status: DC
  Administered 2017-08-28 – 2017-08-30 (×3): 100 mg via ORAL
  Filled 2017-08-28 (×3): qty 2

## 2017-08-28 MED ORDER — ONDANSETRON HCL 4 MG/2ML IJ SOLN
4.0000 mg | Freq: Four times a day (QID) | INTRAMUSCULAR | Status: DC | PRN
  Administered 2017-08-28: 4 mg via INTRAVENOUS
  Filled 2017-08-28: qty 2

## 2017-08-28 MED ORDER — FENTANYL CITRATE (PF) 250 MCG/5ML IJ SOLN
INTRAMUSCULAR | Status: DC | PRN
  Administered 2017-08-28 (×10): 50 ug via INTRAVENOUS

## 2017-08-28 MED ORDER — CEFAZOLIN SODIUM-DEXTROSE 2-4 GM/100ML-% IV SOLN
2.0000 g | INTRAVENOUS | Status: AC
  Administered 2017-08-28 (×2): 2 g via INTRAVENOUS

## 2017-08-28 MED ORDER — OXYCODONE HCL 5 MG PO TABS
10.0000 mg | ORAL_TABLET | ORAL | Status: DC | PRN
  Administered 2017-08-28 – 2017-08-30 (×11): 10 mg via ORAL
  Filled 2017-08-28 (×10): qty 2

## 2017-08-28 MED ORDER — MENTHOL 3 MG MT LOZG: 1.0000 | LOZENGE | OROMUCOSAL | Status: DC | PRN

## 2017-08-28 MED ORDER — ACETAMINOPHEN 325 MG PO TABS
650.0000 mg | ORAL_TABLET | ORAL | Status: DC | PRN
Start: 2017-08-28 — End: 2017-08-30
  Administered 2017-08-28: 650 mg via ORAL
  Filled 2017-08-28: qty 2

## 2017-08-28 SURGICAL SUPPLY — 66 items
BUR EGG ELITE 4.0 (BURR) IMPLANT
BUR EGG ELITE 4.0MM (BURR)
BUR MATCHSTICK NEURO 3.0 LAGG (BURR) IMPLANT
CLOSURE STERI-STRIP 1/2X4 (GAUZE/BANDAGES/DRESSINGS) ×1
CLSR STERI-STRIP ANTIMIC 1/2X4 (GAUZE/BANDAGES/DRESSINGS) ×2 IMPLANT
CONT SPEC 4OZ CLIKSEAL STRL BL (MISCELLANEOUS) ×3 IMPLANT
CORD BI POLAR (MISCELLANEOUS) ×3 IMPLANT
DRAIN CHANNEL 15F RND FF W/TCR (WOUND CARE) ×3 IMPLANT
DRAPE MICROSCOPE LEICA 46X105 (MISCELLANEOUS) IMPLANT
DRAPE POUCH INSTRU U-SHP 10X18 (DRAPES) ×3 IMPLANT
DRAPE SURG 17X11 SM STRL (DRAPES) ×3 IMPLANT
DRAPE U-SHAPE 47X51 STRL (DRAPES) ×3 IMPLANT
DRSG OPSITE POSTOP 4X8 (GAUZE/BANDAGES/DRESSINGS) ×3 IMPLANT
DURAPREP 26ML APPLICATOR (WOUND CARE) ×3 IMPLANT
ELECT BLADE 4.0 EZ CLEAN MEGAD (MISCELLANEOUS)
ELECT CAUTERY BLADE 6.4 (BLADE) ×3 IMPLANT
ELECT PENCIL ROCKER SW 15FT (MISCELLANEOUS) ×3 IMPLANT
ELECT REM PT RETURN 9FT ADLT (ELECTROSURGICAL) ×3
ELECTRODE BLDE 4.0 EZ CLN MEGD (MISCELLANEOUS) IMPLANT
ELECTRODE REM PT RTRN 9FT ADLT (ELECTROSURGICAL) ×1 IMPLANT
EVACUATOR SILICONE 100CC (DRAIN) ×3 IMPLANT
FLOSEAL 10ML (HEMOSTASIS) IMPLANT
GAUZE SPONGE 4X4 12PLY STRL LF (GAUZE/BANDAGES/DRESSINGS) ×3 IMPLANT
GLOVE BIO SURGEON STRL SZ 6.5 (GLOVE) ×2 IMPLANT
GLOVE BIO SURGEONS STRL SZ 6.5 (GLOVE) ×1
GLOVE BIOGEL PI IND STRL 6.5 (GLOVE) ×1 IMPLANT
GLOVE BIOGEL PI IND STRL 8.5 (GLOVE) ×1 IMPLANT
GLOVE BIOGEL PI INDICATOR 6.5 (GLOVE) ×2
GLOVE BIOGEL PI INDICATOR 8.5 (GLOVE) ×2
GLOVE SS BIOGEL STRL SZ 8.5 (GLOVE) ×1 IMPLANT
GLOVE SUPERSENSE BIOGEL SZ 8.5 (GLOVE) ×2
GOWN STRL REUS W/ TWL LRG LVL3 (GOWN DISPOSABLE) ×1 IMPLANT
GOWN STRL REUS W/TWL 2XL LVL3 (GOWN DISPOSABLE) ×6 IMPLANT
GOWN STRL REUS W/TWL LRG LVL3 (GOWN DISPOSABLE) ×2
KIT BASIN OR (CUSTOM PROCEDURE TRAY) ×3 IMPLANT
NEEDLE 22X1 1/2 (OR ONLY) (NEEDLE) ×3 IMPLANT
NEEDLE SPNL 18GX3.5 QUINCKE PK (NEEDLE) ×6 IMPLANT
NS IRRIG 1000ML POUR BTL (IV SOLUTION) ×3 IMPLANT
PACK LAMINECTOMY ORTHO (CUSTOM PROCEDURE TRAY) ×3 IMPLANT
PACK UNIVERSAL I (CUSTOM PROCEDURE TRAY) ×3 IMPLANT
PATTIES SURGICAL .5 X.5 (GAUZE/BANDAGES/DRESSINGS) ×12 IMPLANT
PATTIES SURGICAL .5 X1 (DISPOSABLE) ×3 IMPLANT
RUBBERBAND STERILE (MISCELLANEOUS) IMPLANT
SPOGE SURGIFLO 8M (HEMOSTASIS) ×4
SPONGE LAP 4X18 X RAY DECT (DISPOSABLE) ×9 IMPLANT
SPONGE SURGIFLO 8M (HEMOSTASIS) ×2 IMPLANT
SPONGE SURGIFOAM ABS GEL 100 (HEMOSTASIS) ×3 IMPLANT
STAPLER VISISTAT 35W (STAPLE) ×3 IMPLANT
SUT BONE WAX W31G (SUTURE) ×3 IMPLANT
SUT ETHILON 2 0 FS 18 (SUTURE) ×3 IMPLANT
SUT MON AB 3-0 SH 27 (SUTURE) ×4
SUT MON AB 3-0 SH27 (SUTURE) ×2 IMPLANT
SUT VIC AB 1 CT1 18XCR BRD 8 (SUTURE) ×2 IMPLANT
SUT VIC AB 1 CT1 27 (SUTURE)
SUT VIC AB 1 CT1 27XBRD ANTBC (SUTURE) IMPLANT
SUT VIC AB 1 CT1 8-18 (SUTURE) ×4
SUT VIC AB 2-0 CT1 18 (SUTURE) ×6 IMPLANT
SUT VICRYL 0 UR6 27IN ABS (SUTURE) ×3 IMPLANT
SYR BULB IRRIGATION 50ML (SYRINGE) ×3 IMPLANT
SYR CONTROL 10ML LL (SYRINGE) ×3 IMPLANT
TAPE CLOTH SURG 4X10 WHT LF (GAUZE/BANDAGES/DRESSINGS) ×3 IMPLANT
TOWEL GREEN STERILE (TOWEL DISPOSABLE) ×3 IMPLANT
TOWEL GREEN STERILE FF (TOWEL DISPOSABLE) ×3 IMPLANT
TRAY FOLEY W/METER SILVER 16FR (SET/KITS/TRAYS/PACK) ×3 IMPLANT
WATER STERILE IRR 1000ML POUR (IV SOLUTION) ×3 IMPLANT
YANKAUER SUCT BULB TIP NO VENT (SUCTIONS) ×3 IMPLANT

## 2017-08-28 NOTE — Anesthesia Procedure Notes (Signed)
Procedure Name: Intubation Date/Time: 08/28/2017 12:32 PM Performed by: Freddie Breech, CRNA Pre-anesthesia Checklist: Patient identified, Emergency Drugs available, Suction available and Patient being monitored Patient Re-evaluated:Patient Re-evaluated prior to induction Oxygen Delivery Method: Circle System Utilized Preoxygenation: Pre-oxygenation with 100% oxygen Induction Type: IV induction Ventilation: Mask ventilation without difficulty Laryngoscope Size: Mac and 4 Grade View: Grade I Tube type: Oral Number of attempts: 1 Airway Equipment and Method: Stylet and Oral airway Placement Confirmation: ETT inserted through vocal cords under direct vision,  positive ETCO2 and breath sounds checked- equal and bilateral Secured at: 21 cm Tube secured with: Tape Dental Injury: Teeth and Oropharynx as per pre-operative assessment

## 2017-08-28 NOTE — Anesthesia Preprocedure Evaluation (Signed)
Anesthesia Evaluation  Patient identified by MRN, date of birth, ID band Patient awake    Reviewed: Allergy & Precautions, NPO status , Patient's Chart, lab work & pertinent test results  Airway Mallampati: I  TM Distance: >3 FB Neck ROM: Full    Dental   Pulmonary    Pulmonary exam normal        Cardiovascular hypertension, Pt. on medications Normal cardiovascular exam     Neuro/Psych    GI/Hepatic   Endo/Other    Renal/GU      Musculoskeletal   Abdominal   Peds  Hematology   Anesthesia Other Findings   Reproductive/Obstetrics                             Anesthesia Physical Anesthesia Plan  ASA: II  Anesthesia Plan: General   Post-op Pain Management:    Induction: Intravenous  PONV Risk Score and Plan: 3 and Dexamethasone, Ondansetron and Midazolam  Airway Management Planned: Oral ETT  Additional Equipment:   Intra-op Plan:   Post-operative Plan: Extubation in OR  Informed Consent: I have reviewed the patients History and Physical, chart, labs and discussed the procedure including the risks, benefits and alternatives for the proposed anesthesia with the patient or authorized representative who has indicated his/her understanding and acceptance.     Plan Discussed with: CRNA and Surgeon  Anesthesia Plan Comments:         Anesthesia Quick Evaluation

## 2017-08-28 NOTE — Anesthesia Postprocedure Evaluation (Signed)
Anesthesia Post Note  Patient: Kristine [name]  Procedure(s) Performed: Decompression L3-5, Excision of synovial cyst L4-5 (N/A )     Patient location during evaluation: PACU Anesthesia Type: General Level of consciousness: awake and alert Pain management: pain level controlled Vital Signs Assessment: post-procedure vital signs reviewed and stable Respiratory status: spontaneous breathing, nonlabored ventilation, respiratory function stable and patient connected to nasal cannula oxygen Cardiovascular status: blood pressure returned to baseline and stable Postop Assessment: no apparent nausea or vomiting Anesthetic complications: no    Last Vitals:  Vitals:   08/28/17 1900 08/28/17 1932  BP:  (!) 145/56  Pulse:  66  Resp:  18  Temp: (!) 36.2 C (!) 36.4 C  SpO2:  96%    Last Pain:  Vitals:   08/28/17 1932  TempSrc: Oral  PainSc:                  [name]

## 2017-08-28 NOTE — Op Note (Signed)
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  Postoperative diagnosis: Same  Operative procedure: Lumbar decompression L3-4 L4-5 with unilateral left facetectomy side L4-5 with excision of synovial cyst (unilateral Gill decompression).  Complications: None  First Assistant: Ronette Deter, PA  Intraoperative findings: Very large left-sided L4-5 synovial cyst.  This is removed en bloc and sent to pathology for permanent evaluation.  Patient had severe spinal stenosis L3-4 L4-5.  At the conclusion of the case there was no evidence of CSF leak.  Indications: This is a very pleasant 77 year old woman whose been having debilitating back buttock and bilateral neuropathic leg pain consistent with neurogenic claudication as well as severe L5 radiculopathy on the left side.  Attempts at conservative management had failed to alleviate her symptoms.  Patient's pain got so bad that 2 days prior to surgery she went to the emergency room for evaluation.  Patient presents today for definitive surgical management of the spinal stenosis and synovial cyst.  All appropriate risks benefits and alternatives to surgery were discussed with the patient and consent was obtained  Operative report  Patient was brought to the operating room placed upon the operating room table.  After successful induction of general anesthesia and endotracheal intubation teds SCDs and a Foley were inserted.  She was turned prone onto the Wilson frame and all bony prominences were well-padded.  The back was then prepped and draped in a standard fashion.  Timeout was taken to confirm patient procedure and all other important data.  2 needles were then placed into the back and intraoperative x-ray was taken for localization of the skin incision.  I marked out the incision site and then infiltrated with  quarter percent Marcaine with epinephrine.  Midline incision was made and sharp dissection was carried out down to the deep fascia.  Deep fascia was sharply incised and I stripped the paraspinal muscles to expose the L3-L4 and a L5 spinous process and lamina.  The L4-5 left-sided facet was noted to be significantly degenerated there was a large posterior protrusion consistent with a synovial cyst.  I then expanded my soft tissue dissection until I could visualize the midportion of the facet complex at 3445.  Self-retaining retractors were placed and then I placed a Penfield 4 underneath the L4 facet.  I then took an x-ray to confirm that is at the appropriate level.  Once I confirmed the appropriate level I then remove the L4 spinous process and the majority of that of L3 with a double-action Leksell rongeur.  The cyst was causing significant deflection of the thecal sac and so I elected to start on the left side.  I developed a plane underneath the left-sided lamina of the floor and began resecting it.  Using a Kerrison rongeur performed a generous laminotomy on the left side.  I then continued out laterally and resected the entire inferior L4 facet on that left side.  This created enough working room so I can get into the lateral recess and remove the medial portion of the superior L5 facet to decompress the lateral gutter.  At this point I noted that the cyst was very adherent and difficult to dissect through with my Penfield 4.  As result I elected to go to the L3-4 level and decompress this level.  I developed a plane under the L3 lamina and used my 2 and 3 mm Kerrison  punch to perform a central laminotomy of L3.  Continue my decompression out laterally removing the very thickened ligamentum flavum.  Ultimately once I had an adequate laminectomy I was able to get my Gaspar Garbe for to dissect through the ligamentum flavum and create a plane between the thecal sac and the ligamentum flavum I then used my 2 mm  Kerrison punch to resect the ligamentum flavum and completely exposed the dorsal surface of the thecal sac at the L3 level.  I then worked into the right lateral recess and left lateral recess.  At this point I have a decompression at this level I then went down to the right sided L4-5 and again resected the thickened ligamentum flavum and ultimately was able to develop a plane of the lamina of L4 on the right side Pleatman laminectomy on the right side.  I now had the L3-4 decompression and the right-sided L4-5 decompression which allowed me to actually mobilize the thecal sac and freely developed a plane between the very large remaining synovial cyst and the thecal sac itself.  Using a Penfield 4 I was able to mobilize the thickened ligament flavum and remove it with my Kerrison rongeur.  I then had excellent visualization of the large cyst.  It was consistent with what was seen on preoperative MRI.  As I was gently mobilizing the cyst he did unfortunately popped and there was a large amount of mucinous material that was removed.  I still was able to mobilize the cyst itself and resected with my Kerrison rongeurs.  At this point I now had a complete central decompression from L3 down to L5.  With the cyst removed as well as the ligamentum flavum the thecal sac was able to expand into the new decompressed site.  I then carried my dissection into the lateral recess on the left side removing the ligamentum flavum and performing a foraminotomy with Kerrison rongeurs.  I could now visualize the medial wall of the L5 pedicle and I could freely palpate in the lateral recess at 3 4 and 4 5 and L3-L4 and L5 foramen.  The Gill decompression on the left side provided significant decompression and allowed me to mobilize and excise that large cyst.  Since the contralateral facet was completely intact I do not see the need at this point to move forward with a fusion.  I then used a Surveyor, quantity to ensure an adequate  right-sided decompression.  I was able to pass the [facility] in the lateral recess at 3445 and again out the L4-L5 foramen indicating had adequate decompression on the side.  The wound was copiously irrigated with normal saline and then I used a bipolar cautery to coagulate the very large epidural veins to obtain hemostasis.  I then placed FloSeal to aid in hemostasis process.  I then irrigated the wound copiously with normal saline and checked one last time with my nerve hook and Woodson elevator to ensure an adequate decompression from L3 down to L5 centrally in the lateral recess and into the foramen.  Once this was complete I then placed a deep drain and then closed the deep fascia with interrupted #1 Vicryl suture, then a layer 2-0 Vicryl suture, and the skin was closed with 3-0 Monocryl.  The drain was sutured in with a separate 1-0 silk.  Dry dressings then were applied and the patient was ultimately extubated and transferred the PACU without incident.  The end of the case all needle and sponge counts were  correct there were no adverse intraoperative events.

## 2017-08-28 NOTE — Discharge Instructions (Signed)
Lumbar Diskectomy Lumbar diskectomy is a surgical procedure that treats low back pain that is caused by an injured disk. Disks are cushions in your spine that are between your spinal bones (vertebrae). When a disk ruptures or pushes out of its normal space (herniates), it can cause pressure on your spinal cord or the nerves that leave your spinal cord. This can cause back pain, numbness, or weakness. You may need this procedure if other treatments have not worked. Tell a health care provider about:  Any allergies you have.  All medicines you are taking, including vitamins, herbs, eye drops, creams, and over-the-counter medicines.  Any problems you or family members have had with anesthetic medicines.  Any blood disorders you have.  Any surgeries you have had.  Any medical conditions you have. What are the risks? Generally, this is a safe procedure. However, problems may occur, including:  Bleeding.  Infection.  Damage to the nerve or spinal cord.  Worsening pain.  Failure to relieve your symptoms.  What happens before the procedure?  You may need an imaging study done of your spine to help plan the procedure.  Follow instructions from your health care provider about eating or drinking restrictions.  Ask your health care provider about: ? Changing or stopping your regular medicines. This is especially important if you are taking diabetes medicines or blood thinners. ? Taking medicines such as aspirin and ibuprofen. These medicines can thin your blood. Do not take these medicines before your procedure if your health care provider instructs you not to.  Do not drink alcohol.  Do not use any tobacco products, including cigarettes, chewing tobacco, and electronic cigarettes. If you need help quitting, ask your health care provider.  Plan to have someone take you home after the procedure.  If you go home right after the procedure, plan to have someone with you for 24  hours. What happens during the procedure?  An IV tube will be inserted into one of your veins.  You will be given one or more of the following: ? A medicine that helps you relax (sedative). ? A medicine that numbs the area (local anesthetic). ? A medicine that makes you fall asleep (general anesthetic). ? A medicine that is injected into your spine that numbs the area below and slightly above the injection site (spinal anesthetic).  You will be positioned face-down on the operating table.  Your lower back will be cleaned with a germ-killing (antiseptic) solution.  Your surgeon will make an incision over your spine.  The muscles and nerves over your spine will be moved aside so your vertebrae and injured disk can be seen easily. Your surgeon may use a specific type of operating microscope.  Your surgeon may need to remove some connecting tissue (ligaments) or pieces of bone to get to your disk.  Your surgeon will remove the part of the disk that is causing your symptoms.  The muscles and nerves will be put back in their normal position.  The incision will be closed with stitches (sutures) or staples. A bandage (dressing) will be placed over the incision. The procedure may vary among health care providers and hospitals. What happens after the procedure?  Your blood pressure, heart rate, breathing rate, and blood oxygen level will be monitored often until the medicines you were given have worn off.  Your IV tube can be taken out when you are able to drink fluids on your own.  You will be encouraged to get up and   walk around as soon as you can.  You may meet with a physical therapist to talk about recovering at home. This information is not intended to replace advice given to you by your health care provider. Make sure you discuss any questions you have with your health care provider. Document Released: 09/28/2014 Document Revised: 10/20/2015 Document Reviewed: 04/21/2014 Elsevier  Interactive Patient Education  2018 [employer]  

## 2017-08-28 NOTE — Brief Op Note (Signed)
08/28/2017  4:56 PM  PATIENT:  West Pugh Griesinger  77 y.o. female  PRE-OPERATIVE DIAGNOSIS:  Lumbar spinal stenosis with synovial cyst  POST-OPERATIVE DIAGNOSIS:  Lumbar spinal stenosis with synovial cyst  PROCEDURE:  Procedure(s) with comments: Decompression L3-5, Excision of synovial cyst L4-5 (N/A) - 3.5 hrs  SURGEON:  Surgeon(s) and Role:    Melina Schools, MD - Primary  PHYSICIAN ASSISTANT:   ASSISTANTS: Carmen Mayo   ANESTHESIA:   general  EBL:  300 mL   BLOOD ADMINISTERED:none  DRAINS: 1 JP   LOCAL MEDICATIONS USED:  MARCAINE     SPECIMEN:  Source of Specimen:  L4/5 left synovial cyst  DISPOSITION OF SPECIMEN:  PATHOLOGY  COUNTS:  YES  TOURNIQUET:  * No tourniquets in log *  DICTATION: .Dragon Dictation  PLAN OF CARE: Admit for overnight observation  PATIENT DISPOSITION:  PACU - hemodynamically stable.

## 2017-08-28 NOTE — Transfer of Care (Signed)
Immediate Anesthesia Transfer of Care Note  Patient: Kristine [name]  Procedure(s) Performed: Decompression L3-5, Excision of synovial cyst L4-5 (N/A )  Patient Location: PACU  Anesthesia Type:General  Level of Consciousness: awake, alert , oriented and patient cooperative  Airway & Oxygen Therapy: Patient Spontanous Breathing and Patient connected to nasal cannula oxygen  Post-op Assessment: Report given to RN and Post -op Vital signs reviewed and stable  Post vital signs: Reviewed and stable  Last Vitals:  Vitals Value Taken Time  BP 161/73 08/28/2017  5:12 PM  Temp    Pulse 93 08/28/2017  5:17 PM  Resp 19 08/28/2017  5:17 PM  SpO2 99 % 08/28/2017  5:17 PM  Vitals shown include unvalidated device data.  Last Pain:  Vitals:   08/28/17 0952  TempSrc:   PainSc: 3          Complications: No apparent anesthesia complications

## 2017-08-29 ENCOUNTER — Encounter (HOSPITAL_COMMUNITY): Payer: Self-pay | Admitting: Orthopedic Surgery

## 2017-08-29 DIAGNOSIS — M5126 Other intervertebral disc displacement, lumbar region: Secondary | ICD-10-CM | POA: Diagnosis not present

## 2017-08-29 MED ORDER — MAGNESIUM CITRATE PO SOLN
1.0000 | Freq: Once | ORAL | Status: AC
  Administered 2017-08-30: 1 via ORAL
  Filled 2017-08-29: qty 296

## 2017-08-29 MED FILL — Thrombin For Soln 20000 Unit: CUTANEOUS | Qty: 1 | Status: AC

## 2017-08-29 NOTE — Progress Notes (Signed)
    Subjective: 1 Day Post-Op Procedure(s) (LRB): Decompression L3-5, Excision of synovial cyst L4-5 (N/A) Patient reports pain as 3 on 0-10 scale.   Denies CP or SOB.  Voiding without difficulty. Positive flatus. Objective: Vital signs in last 24 hours: Temp:  [97.2 F (36.2 C)-98.6 F (37 C)] 97.9 F (36.6 C) (04/04 0716) Pulse Rate:  [64-90] 66 (04/04 0716) Resp:  [14-21] 18 (04/04 0716) BP: (106-178)/(49-75) 121/49 (04/04 0716) SpO2:  [90 %-98 %] 97 % (04/04 0716) Weight:  [106.1 kg (234 lb)] 106.1 kg (234 lb) (04/03 0931)  Intake/Output from previous day: 04/03 0701 - 04/04 0700 In: 1600 [I.V.:1400; IV Piggyback:200] Out: 1015 [Urine:535; Drains:180; Blood:300] Intake/Output this shift: No intake/output data recorded.  Labs: No results for input(s): HGB in the last 72 hours. No results for input(s): WBC, RBC, HCT, PLT in the last 72 hours. No results for input(s): NA, K, CL, CO2, BUN, CREATININE, GLUCOSE, CALCIUM in the last 72 hours. No results for input(s): LABPT, INR in the last 72 hours.  Physical Exam: Neurologically intact ABD soft Sensation intact distally Dorsiflexion/Plantar flexion intact Incision: 180 drain out put since surgery, 30 cc this am Compartment soft Body mass index is 37.2 kg/m.   Assessment/Plan: 1 Day Post-Op Procedure(s) (LRB): Decompression L3-5, Excision of synovial cyst L4-5 (N/A) Advance diet Up with therapy  Monitor drain output Consider DC today vs tomorrow  Mayo, Darla Lesches for Dr. Melina Schools [facility] [phone] 08/29/2017, 7:32 AM

## 2017-08-29 NOTE — Evaluation (Signed)
Physical Therapy Evaluation Patient Details Name: Kristine [name] MRN: [id number] DOB: [birth year] Today's Date: 08/29/2017   History of Present Illness  Pt is a 77 y/o female now s/p Decompression L3-5, Excision of synovial cyst L4-5 on 08/28/17. PMHx includes HTN, Schwannoma, thyroid disease  Clinical Impression  Pt admitted with above diagnosis. Pt currently with functional limitations due to the deficits listed below (see PT Problem List). At the time of PT eval pt was able to perform transfers and ambulation with gross min guard assist for balance support to supervision for safety. Pt was educated on brace application/wearing schedule, precautions, car transfer, and activity progression. Pt will benefit from skilled PT to increase their independence and safety with mobility to allow discharge to the venue listed below.       Follow Up Recommendations No PT follow up;Supervision for mobility/OOB    Equipment Recommendations  None recommended by PT    Recommendations for Other Services       Precautions / Restrictions Precautions Precautions: Back Precaution Booklet Issued: Yes (comment) Precaution Comments: Reviewed precautions and cued pt for maintenance of precautions during functional mobility Required Braces or Orthoses: Spinal Brace Spinal Brace: Lumbar corset;Applied in sitting position Restrictions Weight Bearing Restrictions: No      Mobility  Bed Mobility Overal bed mobility: Needs Assistance Bed Mobility: Rolling;Sidelying to Sit;Sit to Sidelying Rolling: Supervision Sidelying to sit: Supervision     Sit to sidelying: Supervision General bed mobility comments: VC's for proper log roll technique.   Transfers Overall transfer level: Needs assistance Equipment used: None Transfers: Sit to/from Stand Sit to Stand: Min guard         General transfer comment: Hands on guarding for power-up to full stand.   Ambulation/Gait Ambulation/Gait assistance: Min  guard Ambulation Distance (Feet): 400 Feet Assistive device: Straight cane;None Gait Pattern/deviations: Step-through pattern;Decreased stride length;Trunk flexed;Trendelenburg Gait velocity: Decreased Gait velocity interpretation: Below normal speed for age/gender General Gait Details: Initially pt with trendelenberg gait pattern and noted large lateral sway with steps. With single UE support pt was able to make corrective changes and demonstrate a more fluid gait pattern  Stairs Stairs: Yes Stairs assistance: Min guard Stair Management: One rail Left;Forwards;Sideways;Step to pattern Number of Stairs: 10 General stair comments: VC's for sequencing and general safety. Pt was able to negotiate a flight of stairs without assistance. HHA provided on the L however no support truly provided on the left side. Pt did better negotiating sideways and husband was educated on how to assist at home.   Wheelchair Mobility    Modified Rankin (Stroke Patients Only)       Balance Overall balance assessment: Mild deficits observed, not formally tested                                           Pertinent Vitals/Pain Pain Assessment: Faces Faces Pain Scale: Hurts even more Pain Descriptors / Indicators: Tender;Grimacing Pain Intervention(s): Monitored during session    Home Living Family/patient expects to be discharged to:: Private residence Living Arrangements: Spouse/significant other Available Help at Discharge: Family Type of Home: House Home Access: Stairs to enter Entrance Stairs-Rails: Right Entrance Stairs-Number of Steps: 3-4 Home Layout: One level Home Equipment: Environmental consultant - 2 wheels;Bedside commode;Shower seat;Adaptive equipment      Prior Function Level of Independence: Independent;Independent with assistive device(s)         Comments: occasionally  using SPC for ambulating longer distances      Hand Dominance        Extremity/Trunk Assessment    Upper Extremity Assessment Upper Extremity Assessment: Overall WFL for tasks assessed    Lower Extremity Assessment Lower Extremity Assessment: Generalized weakness(Consistent with pre-op diagnosis)    Cervical / Trunk Assessment Cervical / Trunk Assessment: Other exceptions Cervical / Trunk Exceptions: s/p lumbar surgery   Communication   Communication: No difficulties  Cognition Arousal/Alertness: Awake/alert Behavior During Therapy: WFL for tasks assessed/performed Overall Cognitive Status: Within Functional Limits for tasks assessed                                        General Comments      Exercises     Assessment/Plan    PT Assessment Patient needs continued PT services  PT Problem List Decreased range of motion;Decreased strength;Decreased activity tolerance;Decreased balance;Decreased mobility;Decreased knowledge of use of DME;Decreased safety awareness;Decreased knowledge of precautions;Pain       PT Treatment Interventions DME instruction;Gait training;Stair training;Functional mobility training;Therapeutic activities;Therapeutic exercise;Neuromuscular re-education;Patient/family education    PT Goals (Current goals can be found in the Care Plan section)  Acute Rehab PT Goals Patient Stated Goal: remain independent  PT Goal Formulation: With patient Time For Goal Achievement: 09/05/17 Potential to Achieve Goals: Good    Frequency Min 5X/week   Barriers to discharge        Co-evaluation               AM-PAC PT "6 Clicks" Daily Activity  Outcome Measure Difficulty turning over in bed (including adjusting bedclothes, sheets and blankets)?: None Difficulty moving from lying on back to sitting on the side of the bed? : A Little Difficulty sitting down on and standing up from a chair with arms (e.g., wheelchair, bedside commode, etc,.)?: A Little Help needed moving to and from a bed to chair (including a wheelchair)?: A Little Help  needed walking in hospital room?: A Little Help needed climbing 3-5 steps with a railing? : A Little 6 Click Score: 19    End of Session Equipment Utilized During Treatment: Gait belt;Back brace Activity Tolerance: Patient tolerated treatment well Patient left: in bed;with call bell/phone within reach;with family/visitor present Nurse Communication: Mobility status PT Visit Diagnosis: Unsteadiness on feet (R26.81);Pain Pain - part of body: (back)    Time: 0911-1002 PT Time Calculation (min) (ACUTE ONLY): 51 min   Charges:   PT Evaluation $PT Eval Moderate Complexity: 1 Mod PT Treatments $Gait Training: 23-37 mins   PT G Codes:        Rolinda Roan, PT, DPT Acute Rehabilitation Services Pager: [phone]   Thelma Comp 08/29/2017, 11:16 AM

## 2017-08-29 NOTE — Evaluation (Addendum)
Occupational Therapy Evaluation Patient Details Name: Kristine [name] MRN: [id number] DOB: 12/26/1940 Today's Date: 08/29/2017    History of Present Illness Pt is a 77 y/o female now s/p Decompression L3-5, Excision of synovial cyst L4-5 on 08/28/17. PMHx includes HTN, Schwannoma, thyroid disease   Clinical Impression   This 77 y/o F presents with the above. At baseline pt is independent with ADLs and functional mobility. Pt completing room level functional mobility this session without AD and MinGuard-supervision. Currently requires minA for LB ADLs secondary to adhering to back precautions. Reviewed safety, AE and compensatory techniques for completing ADLs while adhering to precautions with pt verbalizing and return demonstrating understanding. Will continue to follow while pt remains in acute setting to maximize her safety and independence with ADLs and mobility.     Follow Up Recommendations  No OT follow up    Equipment Recommendations  None recommended by OT;Other (comment)(Pt's DME needs are met )           Precautions / Restrictions Precautions Precautions: Back Precaution Booklet Issued: Yes (comment) Precaution Comments: issued and reviewed with pt  Required Braces or Orthoses: Spinal Brace Spinal Brace: Lumbar corset;Applied in sitting position Restrictions Weight Bearing Restrictions: No      Mobility Bed Mobility               General bed mobility comments: sitting EOB upon arrival, verbally reviewed log roll technique   Transfers Overall transfer level: Needs assistance Equipment used: None Transfers: Sit to/from Stand Sit to Stand: Min assist;Supervision         General transfer comment: MinA to rise from EOB; supervision from [facility] over toilet     Balance Overall balance assessment: Mild deficits observed, not formally tested                                         ADL either performed or assessed with clinical judgement   ADL  Overall ADL's : Needs assistance/impaired Eating/Feeding: Modified independent;Sitting   Grooming: Oral care;Supervision/safety;Standing Grooming Details (indicate cue type and reason): use of compensatory method for task completion  Upper Body Bathing: Min guard;Sitting   Lower Body Bathing: Minimal assistance;Sit to/from stand;With adaptive equipment   Upper Body Dressing : Min guard;Sitting   Lower Body Dressing: Minimal assistance;Sit to/from stand;With adaptive equipment Lower Body Dressing Details (indicate cue type and reason): educated on use of reacher for LB dressing with pt return demonstrating understanding; pt reports she used to have sock aid but misplaced it, reports spouse can assist with this  Toilet Transfer: Min guard;Ambulation;BSC Toilet Transfer Details (indicate cue type and reason): BSC over toilet Toileting- Clothing Manipulation and Hygiene: Min guard;Sit to/from stand Toileting - Clothing Manipulation Details (indicate cue type and reason): educated on use of AE for peri-care, pt verbalizing understanding    Clinical cytogeneticist Details (indicate cue type and reason): discussed use of built in shower seat vs 3:1 in shower for increased safety during task completion  Functional mobility during ADLs: Min guard General ADL Comments: educated on safety, AE, and compensatory techniques for completing ADLs and functional mobility                          Pertinent Vitals/Pain Pain Assessment: Faces Faces Pain Scale: Hurts even more Pain Descriptors / Indicators: Tender;Grimacing Pain Intervention(s): Limited activity within patient's tolerance;Monitored during session  Extremity/Trunk Assessment Upper Extremity Assessment Upper Extremity Assessment: Overall WFL for tasks assessed   Lower Extremity Assessment Lower Extremity Assessment: Defer to PT evaluation   Cervical / Trunk Assessment Cervical / Trunk Assessment: Other  exceptions Cervical / Trunk Exceptions: s/p lumbar surgery    Communication Communication Communication: No difficulties   Cognition Arousal/Alertness: Awake/alert Behavior During Therapy: WFL for tasks assessed/performed Overall Cognitive Status: Within Functional Limits for tasks assessed                                                      Home Living Family/patient expects to be discharged to:: Private residence Living Arrangements: Spouse/significant other Available Help at Discharge: Family Type of Home: House Home Access: Stairs to enter Technical brewer of Steps: 3-4 Entrance Stairs-Rails: Right Home Layout: One level     Bathroom Shower/Tub: Occupational psychologist: Handicapped height     Home Equipment: Environmental consultant - 2 wheels;Bedside commode;Shower seat;Adaptive equipment Adaptive Equipment: Reacher        Prior Functioning/Environment Level of Independence: Independent;Independent with assistive device(s)        Comments: occasionally using SPC for ambulating longer distances         OT Problem List: Decreased knowledge of use of DME or AE;Decreased knowledge of precautions      OT Treatment/Interventions: Self-care/ADL training;DME and/or AE instruction;Balance training;Patient/family education;Therapeutic activities    OT Goals(Current goals can be found in the care plan section) Acute Rehab OT Goals Patient Stated Goal: remain independent  OT Goal Formulation: With patient Time For Goal Achievement: 09/12/17 Potential to Achieve Goals: Good ADL Goals Pt Will Perform Grooming: with modified independence;standing Pt Will Perform Lower Body Bathing: with modified independence;sit to/from stand;with adaptive equipment Pt Will Perform Lower Body Dressing: with supervision;sit to/from stand;with adaptive equipment Pt Will Perform Toileting - Clothing Manipulation and hygiene: with modified independence;sit to/from  stand;with adaptive equipment Pt Will Perform Tub/Shower Transfer: Shower transfer;ambulating;shower seat;with modified independence  OT Frequency: Min 2X/week                             AM-PAC PT "6 Clicks" Daily Activity     Outcome Measure Help from another person eating meals?: None Help from another person taking care of personal grooming?: None Help from another person toileting, which includes using toliet, bedpan, or urinal?: A Little Help from another person bathing (including washing, rinsing, drying)?: A Little Help from another person to put on and taking off regular upper body clothing?: None Help from another person to put on and taking off regular lower body clothing?: A Little 6 Click Score: 21   End of Session Equipment Utilized During Treatment: Gait belt;Back brace Nurse Communication: Mobility status  Activity Tolerance: Patient tolerated treatment well Patient left: in chair;with call bell/phone within reach  OT Visit Diagnosis: Other abnormalities of gait and mobility (R26.89);Pain Pain - part of body: (back)                Time: 3817-7116 OT Time Calculation (min): 39 min Charges:  OT General Charges $OT Visit: 1 Visit OT Evaluation $OT Eval Low Complexity: 1 Low OT Treatments $Self Care/Home Management : 8-22 mins G-Codes:     Lou Cal, OT Pager [phone] 08/29/2017   Raymondo Band 08/29/2017,  9:53 AM

## 2017-08-30 DIAGNOSIS — M5126 Other intervertebral disc displacement, lumbar region: Secondary | ICD-10-CM | POA: Diagnosis not present

## 2017-08-30 MED ORDER — FLEET ENEMA 7-19 GM/118ML RE ENEM
1.0000 | ENEMA | Freq: Every day | RECTAL | Status: DC | PRN
  Administered 2017-08-30: 1 via RECTAL
  Filled 2017-08-30: qty 1

## 2017-08-30 NOTE — Progress Notes (Signed)
    Subjective: Procedure(s) (LRB): Decompression L3-5, Excision of synovial cyst L4-5 (N/A) 2 Days Post-Op  Patient reports pain as 3 on 0-10 scale.  Reports decreased leg pain reports incisional back pain   Positive void Negative bowel movement Positive flatus Negative chest pain or shortness of breath  Objective: Vital signs in last 24 hours: Temp:  [97.6 F (36.4 C)-98.6 F (37 C)] 98.5 F (36.9 C) (04/05 0402) Pulse Rate:  [66-78] 74 (04/05 0402) Resp:  [18-20] 19 (04/05 0402) BP: (102-134)/(39-63) 129/51 (04/05 0402) SpO2:  [96 %-100 %] 96 % (04/05 0402)  Intake/Output from previous day: 04/04 0701 - 04/05 0700 In: 360 [P.O.:360] Out: 40 [Drains:40]  Labs: No results for input(s): WBC, RBC, HCT, PLT in the last 72 hours. No results for input(s): NA, K, CL, CO2, BUN, CREATININE, GLUCOSE, CALCIUM in the last 72 hours. No results for input(s): LABPT, INR in the last 72 hours.  Physical Exam: Neurologically intact Sensation intact distally Dorsiflexion/Plantar flexion intact Incision: dressing C/D/I Compartment soft Body mass index is 37.2 kg/m.   Assessment/Plan: Patient stable  xrays n/a Continue mobilization with physical therapy Continue care  Advance diet Up with therapy  Fleets this AM for constipation Plan on d/c later today  Melina Schools, MD [place] [phone]

## 2017-08-30 NOTE — Progress Notes (Signed)
Patient alert and oriented, mae's well, voiding adequate amount of urine, swallowing without difficulty, c/o mild pain at time of discharge. Patient discharged home with family. Script and discharged instructions given to patient. Patient and family stated understanding of instructions given. Patient has an appointment with Dr. Brooks    

## 2017-08-30 NOTE — Progress Notes (Signed)
Occupational Therapy Treatment Patient Details Name: Kristine [name] MRN: [id number] DOB: 11-15-1940 Today's Date: 08/30/2017    History of present illness Pt is a 77 y/o female now s/p Decompression L3-5, Excision of synovial cyst L4-5 on 08/28/17. PMHx includes HTN, Schwannoma, thyroid disease   OT comments  Pt demonstrates shower transfer, bed transfer and don doff of brace. Pt educated on medication management and setting an alarm. Pt will have spouse (A) upon d/c home.    Follow Up Recommendations  No OT follow up    Equipment Recommendations  None recommended by OT;Other (comment)(has DME at home ( 3n1 and rw in attic))    Recommendations for Other Services      Precautions / Restrictions Precautions Precautions: Back Precaution Comments: pt able to recall back precautions needed some education on brace Required Braces or Orthoses: Spinal Brace Spinal Brace: Lumbar corset;Applied in sitting position       Mobility Bed Mobility Overal bed mobility: Needs Assistance   Rolling: Min guard     Sit to supine: Min assist   General bed mobility comments: pt requires education on leaning down on R hand and then elbow and then pillow. pt benefits from lifting one leg at a time due to spasms. pt benefits from a task step by step instruction. pt able to recall and incr return demo  Transfers Overall transfer level: Needs assistance Equipment used: None Transfers: Sit to/from Stand Sit to Stand: Min assist         General transfer comment: hand held (A) . recommend use of RW for home at this time    Balance Overall balance assessment: Needs assistance   Sitting balance-Leahy Scale: Fair     Standing balance support: Bilateral upper extremity supported;During functional activity Standing balance-Leahy Scale: Poor Standing balance comment: pt reaching fo rtherapist for hand held support                           ADL either performed or assessed with  clinical judgement   ADL Overall ADL's : Needs assistance/impaired Eating/Feeding: Modified independent               Upper Body Dressing : Supervision/safety Upper Body Dressing Details (indicate cue type and reason): pt reporting the brace being lose and after return demo . it was discovered that the patient was pulling out the side turns on the brace causing the brace to return to full extension. Pt once educated to leave the side turns pushed in to lock the brace in place reports that hte brace is remaining tight.              Tub/ Shower Transfer: Medical sales representative Details (indicate cue type and reason): pt educated on the need to have hand holds by reaching for her environment at this time. pt advised to use RW and to back into the shower and use of 3n1 as a seat to sit during shower.  Functional mobility during ADLs: Minimal assistance(hand held (A)) General ADL Comments: pt reaching for wall and holding therapist with L UE. pt advised that use of a RW would be safer than reaching for the wall of furniture at home to maintain an upright posture    Pt educated on: clothing between brace, never sleep in brace, set an alarm at night for medication, avoid sitting for long periods of time, correct bed positioning for sleeping, correct sequence for bed mobility, avoiding lifting more  than 5 pounds and never wash directly over incision. All education is complete and patient indicates understanding.    Vision       Perception     Praxis      Cognition Arousal/Alertness: Awake/alert Behavior During Therapy: WFL for tasks assessed/performed Overall Cognitive Status: Within Functional Limits for tasks assessed                                          Exercises     Shoulder Instructions       General Comments dressing appears intact    Pertinent Vitals/ Pain       Pain Assessment: Faces Faces Pain Scale: Hurts even more Pain  Descriptors / Indicators: Spasm Pain Intervention(s): Monitored during session;Premedicated before session;Repositioned  Home Living                                          Prior Functioning/Environment              Frequency  Min 2X/week        Progress Toward Goals  OT Goals(current goals can now be found in the care plan section)  Progress towards OT goals: Progressing toward goals  Acute Rehab OT Goals Patient Stated Goal: remain independent  OT Goal Formulation: With patient Time For Goal Achievement: 09/12/17 Potential to Achieve Goals: Good ADL Goals Pt Will Perform Grooming: with modified independence;standing Pt Will Perform Lower Body Bathing: with modified independence;sit to/from stand;with adaptive equipment Pt Will Perform Lower Body Dressing: with supervision;sit to/from stand;with adaptive equipment Pt Will Perform Toileting - Clothing Manipulation and hygiene: with modified independence;sit to/from stand;with adaptive equipment Pt Will Perform Tub/Shower Transfer: Shower transfer;ambulating;shower seat;with modified independence  Plan Discharge plan remains appropriate    Co-evaluation                 AM-PAC PT "6 Clicks" Daily Activity     Outcome Measure   Help from another person eating meals?: None Help from another person taking care of personal grooming?: None Help from another person toileting, which includes using toliet, bedpan, or urinal?: A Little Help from another person bathing (including washing, rinsing, drying)?: A Little Help from another person to put on and taking off regular upper body clothing?: None Help from another person to put on and taking off regular lower body clothing?: A Little 6 Click Score: 21    End of Session Equipment Utilized During Treatment: Gait belt;Rolling walker;Back brace  OT Visit Diagnosis: Unsteadiness on feet (R26.81);Muscle weakness (generalized) (M62.81)   Activity  Tolerance Patient tolerated treatment well   Patient Left in bed;with call bell/phone within reach   Nurse Communication Mobility status;Precautions        Time: 6720-9470 OT Time Calculation (min): 25 min  Charges: OT General Charges $OT Visit: 1 Visit OT Treatments $Self Care/Home Management : 23-37 mins   Jeri Modena   OTR/L Pager: [phone] Office: [phone] .    Parke Poisson B 08/30/2017, 8:28 AM

## 2017-08-30 NOTE — Progress Notes (Signed)
Physical Therapy Treatment Patient Details Name: Kristine [name] MRN: [id number] DOB: March 06, 1941 Today's Date: 08/30/2017    History of Present Illness Pt is a 77 y/o female now s/p Decompression L3-5, Excision of synovial cyst L4-5 on 08/28/17. PMHx includes HTN, Schwannoma, thyroid disease    PT Comments    Pt progressing towards physical therapy goals. Was able to ambulate with increased safety and independence with RW for support. Pt demonstrating several instances of LE knee buckling however with RW, no assist from therapist required to recover. Pt was educated on precautions, brace application/wearing schedule, activity progression, and car transfer. Will continue to follow and progress as able per POC.    Follow Up Recommendations  No PT follow up;Supervision for mobility/OOB     Equipment Recommendations  None recommended by PT    Recommendations for Other Services       Precautions / Restrictions Precautions Precautions: Back Precaution Booklet Issued: Yes (comment) Precaution Comments: pt able to recall back precautions needed some education on brace Required Braces or Orthoses: Spinal Brace Spinal Brace: Lumbar corset;Applied in sitting position Restrictions Weight Bearing Restrictions: No    Mobility  Bed Mobility Overal bed mobility: Needs Assistance Bed Mobility: Rolling;Sidelying to Sit;Sit to Sidelying Rolling: Min guard Sidelying to sit: Min guard   Sit to supine: Min assist Sit to sidelying: Min guard General bed mobility comments: Increased time and cues for proper log roll technique. Pt had difficulty elevating trunk to full sitting and then LE's up into sidelying.   Transfers Overall transfer level: Needs assistance Equipment used: None;Rolling walker (2 wheeled) Transfers: Sit to/from Stand Sit to Stand: Min assist;Min guard         General transfer comment: Assist required if pt did not have walker to immediately grab on to. She had several  spasms on power-up to full stand required heavy min assist to achieve full stand.   Ambulation/Gait Ambulation/Gait assistance: Min guard Ambulation Distance (Feet): 200 Feet Assistive device: Rolling walker (2 wheeled) Gait Pattern/deviations: Step-through pattern;Decreased stride length;Trunk flexed Gait velocity: Decreased Gait velocity interpretation: Below normal speed for age/gender General Gait Details: Improved gait with RW use. Pt moving slowly and guarded due to pain.    Stairs Stairs: Yes   Stair Management: One rail Right;Sideways;Step to pattern Number of Stairs: 4 General stair comments: VC's for sequencing and general safety. Pt required increased cues for maintenance of back precautions throughout stair training.   Wheelchair Mobility    Modified Rankin (Stroke Patients Only)       Balance Overall balance assessment: Needs assistance Sitting-balance support: Feet supported;Feet unsupported Sitting balance-Leahy Scale: Fair     Standing balance support: Bilateral upper extremity supported;During functional activity Standing balance-Leahy Scale: Poor Standing balance comment: Reliant on RW for support especially during muscle spasms                            Cognition Arousal/Alertness: Awake/alert Behavior During Therapy: WFL for tasks assessed/performed Overall Cognitive Status: Within Functional Limits for tasks assessed                                        Exercises      General Comments General comments (skin integrity, edema, etc.): dressing appears intact      Pertinent Vitals/Pain Pain Assessment: Faces Faces Pain Scale: Hurts even more Pain Descriptors /  Indicators: Spasm Pain Intervention(s): Limited activity within patient's tolerance;Monitored during session;Repositioned    Home Living                      Prior Function            PT Goals (current goals can now be found in the care plan  section) Acute Rehab PT Goals Patient Stated Goal: remain independent  PT Goal Formulation: With patient Time For Goal Achievement: 09/05/17 Potential to Achieve Goals: Good Progress towards PT goals: Progressing toward goals    Frequency    Min 5X/week      PT Plan Current plan remains appropriate    Co-evaluation              AM-PAC PT "6 Clicks" Daily Activity  Outcome Measure  Difficulty turning over in bed (including adjusting bedclothes, sheets and blankets)?: A Little Difficulty moving from lying on back to sitting on the side of the bed? : A Little Difficulty sitting down on and standing up from a chair with arms (e.g., wheelchair, bedside commode, etc,.)?: A Little Help needed moving to and from a bed to chair (including a wheelchair)?: A Little Help needed walking in hospital room?: A Little Help needed climbing 3-5 steps with a railing? : A Little 6 Click Score: 18    End of Session Equipment Utilized During Treatment: Gait belt;Back brace Activity Tolerance: Patient tolerated treatment well Patient left: in bed;with call bell/phone within reach;with family/visitor present Nurse Communication: Mobility status PT Visit Diagnosis: Unsteadiness on feet (R26.81);Pain Pain - part of body: (back)     Time: 5726-2035 PT Time Calculation (min) (ACUTE ONLY): 36 min  Charges:  $Gait Training: 23-37 mins                    G Codes:       Rolinda Roan, PT, DPT Acute Rehabilitation Services Pager: [phone]    Thelma Comp 08/30/2017, 10:54 AM

## 2017-09-04 NOTE — Discharge Summary (Signed)
Physician Discharge Summary  Patient ID: Kristine [name] MRN: [id number] DOB/AGE: 08/29/1940 77 y.o.  Admit date: 08/28/2017 Discharge date: 08/30/17  Admission Diagnoses:  Lumbar stenosis and synovial cyst  Discharge Diagnoses:  Active Problems:   Status post lumbar spine surgery for decompression of spinal cord   Past Medical History:  Diagnosis Date  . Allergy   . Anal fissure   . High cholesterol   . Hypertension   . Hypothyroidism   . 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 Thyroid disease     Surgeries: Procedure(s): Decompression L3-5, Excision of synovial cyst L4-5 on 08/28/2017   Consultants (if any):   Discharged Condition: Improved  Hospital Course: DEBBERA WOLKEN is an 77 y.o. female who was admitted 08/28/2017 with a diagnosis of Lumbar stenosis and synovial cyst and went to the operating room on 08/28/2017 and underwent the above named procedures.  Post op day one pt reports mild incisional pain controlled on oral medication.  Pt denies difficulty voiding. We continued to monitor drain output.  Post op day 2 pt continues to only have mild post op pain controlled on oral medication.Pt has been ambulating in hallway.  Drain removed.  Pt is cleared by PT.  Fleets administered prior to DC.  She was given perioperative antibiotics:  Anti-infectives (From admission, onward)   Start     Dose/Rate Route Frequency Ordered Stop   08/28/17 2330  ceFAZolin (ANCEF) IVPB 2g/100 mL premix     2 g 200 mL/hr over 30 Minutes Intravenous Every 8 hours 08/28/17 1919 08/29/17 0651   08/28/17 0943  ceFAZolin (ANCEF) 2-4 GM/100ML-% IVPB    Note to Pharmacy:  Debbe Bales, Meredit: cabinet override      08/28/17 0943 08/28/17 1235   08/28/17 0942  ceFAZolin (ANCEF) IVPB 2g/100 mL premix     2 g 200 mL/hr over 30 Minutes Intravenous 30 min pre-op 08/28/17 0942 08/28/17 1627    .  She was given sequential compression devices, early ambulation, and TED for DVT prophylaxis.  She benefited  maximally from the hospital stay and there were no complications.    Recent vital signs:  Vitals:   08/30/17 0831 08/30/17 1125  BP: (!) 125/49 (!) 112/40  Pulse: 70 69  Resp: 18 18  Temp: 98.5 F (36.9 C) 98.7 F (37.1 C)  SpO2: 95% 97%    Recent laboratory studies:  Lab Results  Component Value Date   HGB 14.3 08/21/2017   HGB 14.3 07/24/2017   HGB 13.6 05/13/2017   Lab Results  Component Value Date   WBC 6.6 08/21/2017   PLT 204 08/21/2017   Lab Results  Component Value Date   INR 1.44 07/14/2009   Lab Results  Component Value Date   NA 139 08/21/2017   K 4.0 08/21/2017   CL 107 08/21/2017   CO2 21 (L) 08/21/2017   BUN 15 08/21/2017   CREATININE 0.54 08/21/2017   GLUCOSE 92 08/21/2017    Discharge Medications:   Allergies as of 08/30/2017      Reactions   Corn-containing Products Other (See Comments)   Stomach ache   Demerol    Influenza Vaccines    flushing   Meperidine Hcl    REACTION: Flushing      Medication List    STOP taking these medications   ALPRAZolam 0.25 MG tablet Commonly known as:  XANAX   amoxicillin 500 MG capsule Commonly known as:  AMOXIL   cyanocobalamin 2000 MCG tablet  ibuprofen 200 MG tablet Commonly known as:  ADVIL,MOTRIN   Omega-3 Fish Oil 1200 MG Caps   sulindac 150 MG tablet Commonly known as:  CLINORIL     TAKE these medications   aspirin 81 MG tablet Take 81 mg by mouth daily.   fexofenadine 180 MG tablet Commonly known as:  ALLEGRA Take 180 mg by mouth daily.   fluticasone 50 MCG/ACT nasal spray Commonly known as:  FLONASE Place 2 sprays into both nostrils daily as needed for allergies. Reported on 10/28/2015   hydrochlorothiazide 25 MG tablet Commonly known as:  HYDRODIURIL Take 1 tablet (25 mg total) by mouth daily.   levothyroxine 75 MCG tablet Commonly known as:  SYNTHROID, LEVOTHROID TAKE 1 TABLET (75 MCG TOTAL) BY MOUTH DAILY BEFORE BREAKFAST.   losartan 100 MG tablet Commonly known as:   COZAAR Take 1 tablet (100 mg total) by mouth daily.   losartan-hydrochlorothiazide 100-25 MG tablet Commonly known as:  HYZAAR Take 1 tablet by mouth daily.   methocarbamol 500 MG tablet Commonly known as:  ROBAXIN Take 1 tablet (500 mg total) by mouth 3 (three) times daily.   montelukast 10 MG tablet Commonly known as:  SINGULAIR TAKE 1 TABLET AT BEDTIME What changed:    how much to take  how to take this  when to take this   MYRBETRIQ 25 MG Tb24 tablet Generic drug:  mirabegron ER Take 25 mg by mouth daily as needed (urinary frequency).   ondansetron 4 MG disintegrating tablet Commonly known as:  ZOFRAN ODT Take 1 tablet (4 mg total) by mouth every 8 (eight) hours as needed for nausea or vomiting.   OVER THE COUNTER MEDICATION Antibiotic ointment to big toe   polyethylene glycol packet Commonly known as:  MIRALAX / GLYCOLAX Take 17 g by mouth at bedtime.   PREMARIN 0.625 MG tablet Generic drug:  estrogens (conjugated) TAKE 1 TABLET DAILY What changed:    how much to take  how to take this  when to take this   ROLAIDS PO Take 1,000 mg by mouth daily as needed (indigestion).   simvastatin 40 MG tablet Commonly known as:  ZOCOR TAKE 1 TABLET BY MOUTH ONCE A DAY What changed:    how much to take  how to take this  when to take this     ASK your doctor about these medications   oxyCODONE-acetaminophen 10-325 MG tablet Commonly known as:  PERCOCET Take 1 tablet by mouth every 4 (four) hours as needed for up to 5 days for pain. Ask about: Should I take this medication?       Diagnostic Studies: Mr Lumbar Spine Wo Contrast  Result Date: 08/27/2017 CLINICAL DATA:  Worsening urinary incontinence. Progressive back pain. EXAM: MRI LUMBAR SPINE WITHOUT CONTRAST TECHNIQUE: Multiplanar, multisequence MR imaging of the lumbar spine was performed. No intravenous contrast was administered. COMPARISON:  MRI lumbar spine May 17, 2017 FINDINGS: SEGMENTATION:  For the purposes of this report, the last well-formed intervertebral disc is reported as L5-S1. ALIGNMENT: Maintained lumbar lordosis. Stable grade 1 L2-3 retrolisthesis and grade 1 L5-S1 anterolisthesis. No spondylolysis. VERTEBRAE:Vertebral bodies are intact. Stable severe L2-3 disc height loss with moderate subacute discogenic endplate changes. Mild disc desiccation all lumbar levels with multilevel mild chronic discogenic endplate changes. No acute or abnormal bone marrow signal. CONUS MEDULLARIS AND CAUDA EQUINA: Conus medullaris terminates at T12-L1 and demonstrates normal morphology and signal characteristics. Cauda equina is normal. 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 PARASPINAL AND OTHER  SOFT TISSUES: Included prevertebral and paraspinal soft tissues are nonacute. DISC LEVELS: L1-2: Annular bulging and mild facet arthropathy. No canal stenosis or neural foraminal narrowing. L2-3: Retrolisthesis. Stable small broad-based disc bulge and moderate RIGHT subarticular to extraforaminal disc protrusion contacting the exited RIGHT L2 nerve. Mild facet arthropathy and ligamentum flavum redundancy without canal stenosis. Mild RIGHT neural foraminal narrowing. L3-4: Stable 4 mm broad-based disc bulge asymmetric to the LEFT. Severe facet arthropathy and ligamentum flavum redundancy with similar facet effusions. Moderate canal stenosis, unchanged. Mild bilateral neural foraminal narrowing. L4-5: Stable 4 mm broad-based disc bulge and small LEFT subarticular disc extrusion. Severe facet arthropathy and ligamentum flavum redundancy with stable facet effusions. LEFT facet 11 x 13 x 16 mm synovial cyst was 9 x 10 x 12 mm, within central posterior spinal canal. Severe canal stenosis worse than prior imaging. Mild RIGHT and moderate to severe similar neural foraminal narrowing. L5-S1: Anterolisthesis. Annular bulging with severe facet arthropathy, trace reactive effusions. No canal stenosis. Mild LEFT neural foraminal  narrowing. IMPRESSION: 1. Enlarging LEFT L4-5 facet 11 x 13 x 16 mm synovial cyst superimposed on stable degenerative change of the lumbar spine. 2. Stable grade 1 L2-3 retrolisthesis and grade 1 L5-S1 anterolisthesis without spondylolysis. 3. Worsening severe canal stenosis L4-5. Stable moderate canal stenosis L3-4. 4. Neural foraminal narrowing L2-3 through L5-S1: Moderate to severe on the LEFT at L4-5. Electronically Signed   By: Elon Alas M.D.   On: 08/27/2017 01:06   Dg Lumbar Spine 1 View  Result Date: 08/28/2017 CLINICAL DATA:  77 year old female undergoing lumbar surgery. EXAM: LUMBAR SPINE - 1 VIEW COMPARISON:  Lumbar intraoperative image at 1358 hours today. FINDINGS: Intraoperative portable cross-table lateral view of the lumbar spine labeled image #1 at 1338 hours. Same numbering system as on the later intraoperative image reported separately today, normal lumbar segmentation. Posterior surgical probes are present at the inferior L3 and mid to superior S1 level. Calcified aortic atherosclerosis. IMPRESSION: 1. Early intraoperative localization at the L3 and S1 levels. 2. See also the subsequent intraoperative spine image from 1358 hours today. Electronically Signed   By: Genevie Ann M.D.   On: 08/28/2017 16:57   Dg Spine Portable 1 View  Result Date: 08/28/2017 CLINICAL DATA:  77 year old female undergoing lumbar surgery. EXAM: PORTABLE SPINE - 1 VIEW COMPARISON:  Lumbar MRI 08/27/2017 and earlier. FINDINGS: Same numbering system as on the MRI yesterday designating normal lumbar segmentation, which does appear to be valid on this lateral view. Intraoperative portable cross-table lateral view of the lumbar spine at 1358 hours. A surgical probe is directed toward the L5 vertebral body at the pedicle level, tip projecting over the L5 pars. Advanced L2-L3 disc space loss Re demonstrated. IMPRESSION: Intraoperative localization at L5. Electronically Signed   By: Genevie Ann M.D.   On: 08/28/2017 13:22     Disposition:  Pt presents to clinic in 2 weeks Post op medication provided Discharge Instructions    Incentive spirometry RT   Complete by:  As directed       Follow-up Information    Melina Schools, MD Follow up in 2 week(s).   Specialty:  Orthopedic Surgery Contact information: [address] [place] [address] [phone]            Signed: Valinda Hoar 09/04/2017, 8:02 AM

## 2017-10-18 ENCOUNTER — Other Ambulatory Visit: Payer: Self-pay | Admitting: Family Medicine

## 2017-11-12 ENCOUNTER — Other Ambulatory Visit (INDEPENDENT_AMBULATORY_CARE_PROVIDER_SITE_OTHER): Payer: Federal, State, Local not specified - PPO

## 2017-11-12 ENCOUNTER — Telehealth: Payer: Self-pay | Admitting: Family Medicine

## 2017-11-12 DIAGNOSIS — E039 Hypothyroidism, unspecified: Secondary | ICD-10-CM

## 2017-11-12 DIAGNOSIS — E7800 Pure hypercholesterolemia, unspecified: Secondary | ICD-10-CM

## 2017-11-12 DIAGNOSIS — R7303 Prediabetes: Secondary | ICD-10-CM

## 2017-11-12 LAB — LIPID PANEL
CHOL/HDL RATIO: 4
Cholesterol: 210 mg/dL — ABNORMAL HIGH (ref 0–200)
HDL: 57.1 mg/dL (ref >39.00)
LDL CALC: 119 mg/dL — AB (ref 0–99)
NONHDL: 152.61
TRIGLYCERIDES: 170 mg/dL — AB (ref 0.0–149.0)
VLDL: 34 mg/dL (ref 0.0–40.0)

## 2017-11-12 LAB — BASIC METABOLIC PANEL
BUN: 19 mg/dL (ref 6–23)
CALCIUM: 9.4 mg/dL (ref 8.4–10.5)
CO2: 26 mEq/L (ref 19–32)
Chloride: 105 mEq/L (ref 96–112)
Creatinine, Ser: 0.66 mg/dL (ref 0.40–1.20)
GFR: 92.1 mL/min (ref >60.00)
GLUCOSE: 105 mg/dL — AB (ref 70–99)
POTASSIUM: 4.1 meq/L (ref 3.5–5.1)
Sodium: 141 mEq/L (ref 135–145)

## 2017-11-12 LAB — TSH: TSH: 4.66 u[IU]/mL — ABNORMAL HIGH (ref 0.35–4.50)

## 2017-11-12 LAB — T4, FREE: Free T4: 1.07 ng/dL (ref 0.60–1.60)

## 2017-11-12 LAB — HEMOGLOBIN A1C: HEMOGLOBIN A1C: 5.9 % (ref 4.6–6.5)

## 2017-11-12 NOTE — Telephone Encounter (Signed)
Ordered pt labs for upcoming 6 mo f/u.

## 2017-11-15 ENCOUNTER — Encounter: Payer: Self-pay | Admitting: *Deleted

## 2017-11-19 ENCOUNTER — Encounter: Payer: Self-pay | Admitting: Family Medicine

## 2017-11-19 ENCOUNTER — Ambulatory Visit: Payer: Federal, State, Local not specified - PPO | Admitting: Family Medicine

## 2017-11-19 VITALS — BP 140/72 | HR 83 | Temp 98.8°F | Ht 66.0 in | Wt 229.0 lb

## 2017-11-19 DIAGNOSIS — Z9889 Other specified postprocedural states: Secondary | ICD-10-CM

## 2017-11-19 DIAGNOSIS — I6381 Other cerebral infarction due to occlusion or stenosis of small artery: Secondary | ICD-10-CM | POA: Diagnosis not present

## 2017-11-19 DIAGNOSIS — E7800 Pure hypercholesterolemia, unspecified: Secondary | ICD-10-CM | POA: Diagnosis not present

## 2017-11-19 DIAGNOSIS — I10 Essential (primary) hypertension: Secondary | ICD-10-CM

## 2017-11-19 DIAGNOSIS — G609 Hereditary and idiopathic neuropathy, unspecified: Secondary | ICD-10-CM

## 2017-11-19 DIAGNOSIS — E039 Hypothyroidism, unspecified: Secondary | ICD-10-CM

## 2017-11-19 DIAGNOSIS — D333 Benign neoplasm of cranial nerves: Secondary | ICD-10-CM

## 2017-11-19 DIAGNOSIS — R7303 Prediabetes: Secondary | ICD-10-CM

## 2017-11-19 MED ORDER — GABAPENTIN 100 MG PO CAPS: 100.0000 mg | ORAL_CAPSULE | Freq: Every day | ORAL | 3 refills | Status: DC

## 2017-11-19 NOTE — Assessment & Plan Note (Signed)
Followed by neuro 

## 2017-11-19 NOTE — Assessment & Plan Note (Signed)
Followed by ENT 

## 2017-11-19 NOTE — Assessment & Plan Note (Signed)
Bothersome for pt.. Not much better after back surgery.  Start gabapentin low dose.. Will likely need to increase dose.

## 2017-11-19 NOTE — Progress Notes (Signed)
Subjective:    Patient ID: Kristine [name], female    DOB: 1940-10-25, 77 y.o.   MRN: [id number]  HPI    77 year old female presents for 6 month follow up.   08/28/2017 Status post lumbar spine surgery for decompression of spinal cord.  No complications.  Dr. Rolena Infante.  She has been doing well in PT in last month. Pain control is better than prior to surgery. No longer on narcotic pain med. Using ibuprofen 400 mg twice daily.   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  b12 has helped some. Still bothered day and night with this.  Has not tried a med for this.  Symptoms are worse at night.    Hypertension:   good control on losartan HCTZ.  Using medication without problems or lightheadedness:  none Chest pain with exertion:none Edema:none Short of breath:none Average home BPs: Other issues:  Elevated Cholesterol:  Has been very inactive.. LDL not at goal on simvastatin. Also was off for 2 weeks before test. Lab Results  Component Value Date   CHOL 210 (H) 11/12/2017   HDL 57.10 11/12/2017   LDLCALC 119 (H) 11/12/2017   LDLDIRECT 86.0 05/09/2015   TRIG 170.0 (H) 11/12/2017   CHOLHDL 4 11/12/2017  Using medications without problems: Muscle aches:  Diet compliance: moderate Exercise: minimal Other complaints:   prediabetes  Lab Results  Component Value Date   HGBA1C 5.9 11/12/2017    Hypothyroid  Free t4 well controlled. Lab Results  Component Value Date   TSH 4.66 (H) 11/12/2017     Blood pressure 140/72, pulse 83, temperature 98.8 F (37.1 C), temperature source Oral, height 5\' 6"  (1.676 m), weight 229 lb (103.9 kg), SpO2 97 %. Social History /Family History/Past Medical History reviewed in detail and updated in EMR if needed.   Review of Systems  Constitutional: Positive for fatigue. Negative for fever.  HENT: Negative for congestion.   Eyes: Negative for pain.  Respiratory: Negative for cough and shortness of breath.   Cardiovascular: Negative for  chest pain, palpitations and leg swelling.  Gastrointestinal: Negative for abdominal pain.  Genitourinary: Negative for dysuria and vaginal bleeding.  Musculoskeletal: Negative for back pain.  Neurological: Positive for weakness and numbness. Negative for syncope, light-headedness and headaches.  Psychiatric/Behavioral: Negative for dysphoric mood.       Objective:   Physical Exam  Constitutional: Vital signs are normal. She appears well-developed and well-nourished. She is cooperative.  Non-toxic appearance. She does not appear ill. No distress.  HENT:  Head: Normocephalic.  Right Ear: Hearing, tympanic membrane, external ear and ear canal normal. Tympanic membrane is not erythematous, not retracted and not bulging.  Left Ear: Hearing, tympanic membrane, external ear and ear canal normal. Tympanic membrane is not erythematous, not retracted and not bulging.  Nose: No mucosal edema or rhinorrhea. Right sinus exhibits no maxillary sinus tenderness and no frontal sinus tenderness. Left sinus exhibits no maxillary sinus tenderness and no frontal sinus tenderness.  Mouth/Throat: Uvula is midline, oropharynx is clear and moist and mucous membranes are normal.  Eyes: Pupils are equal, round, and reactive to light. Conjunctivae, EOM and lids are normal. Lids are everted and swept, no foreign bodies found.  Neck: Trachea normal and normal range of motion. Neck supple. Carotid bruit is not present. No thyroid mass and no thyromegaly present.  Cardiovascular: Normal rate, regular rhythm, S1 normal, S2 normal, normal heart sounds, intact distal pulses and normal pulses. Exam reveals no gallop and no friction rub.  No murmur heard. Pulmonary/Chest: Effort normal and breath sounds normal. No tachypnea. No respiratory distress. She has no decreased breath sounds. She has no wheezes. She has no rhonchi. She has no rales.  Abdominal: Soft. Normal appearance and bowel sounds are normal. There is no tenderness.   Musculoskeletal:  Well healing surgical site lower back, decreased ROM  Neurological: She is alert.   Decreased monofilament in bilateral feet  Skin: Skin is warm, dry and intact. No rash noted.  Psychiatric: Her speech is normal and behavior is normal. Judgment and thought content normal. Her mood appears not anxious. Cognition and memory are normal. She does not exhibit a depressed mood.          Assessment & Plan:

## 2017-11-19 NOTE — Assessment & Plan Note (Signed)
Well controlled. Continue current medication.  

## 2017-11-19 NOTE — Assessment & Plan Note (Signed)
Worsened control given inactivity.. Get back on track.. Stay on statin.

## 2017-11-19 NOTE — Assessment & Plan Note (Signed)
At goal.  

## 2017-11-19 NOTE — Patient Instructions (Addendum)
Get back to regular exercise 3-5 times a week as tolerated. Restart simvastatin as you already have.  Can try a trial of gabapentin at night 100 mg.. Call or email update in next month.

## 2017-11-19 NOTE — Assessment & Plan Note (Signed)
Encouraged exercise, weight loss, healthy eating habits. ? ?

## 2018-01-08 ENCOUNTER — Ambulatory Visit
Admission: RE | Admit: 2018-01-08 | Discharge: 2018-01-08 | Disposition: A | Discharge status: Home / Self Care | Payer: Federal, State, Local not specified - PPO | Source: Ambulatory Visit | Attending: Orthopedic Surgery | Admitting: Orthopedic Surgery

## 2018-01-08 DIAGNOSIS — D333 Benign neoplasm of cranial nerves: Secondary | ICD-10-CM

## 2018-01-08 MED ORDER — GADOBENATE DIMEGLUMINE 529 MG/ML IV SOLN
20.0000 mL | Freq: Once | INTRAVENOUS | Status: AC | PRN
  Administered 2018-01-08: 20 mL via INTRAVENOUS

## 2018-02-10 ENCOUNTER — Other Ambulatory Visit: Payer: Self-pay | Admitting: Family Medicine

## 2018-02-10 NOTE — Telephone Encounter (Signed)
Electronic refill request Last office visit 11/19/17 Last refill on both 08/21/17 #90/1 See allergy/contraindication on both

## 2018-02-12 ENCOUNTER — Other Ambulatory Visit: Payer: Self-pay | Admitting: Family Medicine

## 2018-03-17 ENCOUNTER — Ambulatory Visit (INDEPENDENT_AMBULATORY_CARE_PROVIDER_SITE_OTHER)
Admission: RE | Admit: 2018-03-17 | Discharge: 2018-03-17 | Disposition: A | Discharge status: Home / Self Care | Payer: Federal, State, Local not specified - PPO | Source: Ambulatory Visit | Attending: Family Medicine | Admitting: Family Medicine

## 2018-03-17 ENCOUNTER — Encounter: Payer: Self-pay | Admitting: Family Medicine

## 2018-03-17 ENCOUNTER — Ambulatory Visit: Payer: Federal, State, Local not specified - PPO | Admitting: Family Medicine

## 2018-03-17 VITALS — BP 158/86 | HR 68 | Temp 97.8°F | Ht 66.0 in | Wt 234.1 lb

## 2018-03-17 DIAGNOSIS — M25432 Effusion, left wrist: Secondary | ICD-10-CM

## 2018-03-17 DIAGNOSIS — M25532 Pain in left wrist: Secondary | ICD-10-CM | POA: Diagnosis not present

## 2018-03-17 LAB — CBC WITH DIFFERENTIAL/PLATELET
Basophils Absolute: 0.1 10*3/uL (ref 0.0–0.1)
Basophils Relative: 0.9 % (ref 0.0–3.0)
EOS PCT: 1.7 % (ref 0.0–5.0)
Eosinophils Absolute: 0.2 10*3/uL (ref 0.0–0.7)
HEMATOCRIT: 40.3 % (ref 36.0–46.0)
Hemoglobin: 13.7 g/dL (ref 12.0–15.0)
LYMPHS ABS: 1.6 10*3/uL (ref 0.7–4.0)
Lymphocytes Relative: 15.4 % (ref 12.0–46.0)
MCHC: 34.1 g/dL (ref 30.0–36.0)
MCV: 84.4 fl (ref 78.0–100.0)
MONOS PCT: 5.9 % (ref 3.0–12.0)
Monocytes Absolute: 0.6 10*3/uL (ref 0.1–1.0)
NEUTROS ABS: 7.9 10*3/uL — AB (ref 1.4–7.7)
Neutrophils Relative %: 76.1 % (ref 43.0–77.0)
PLATELETS: 203 10*3/uL (ref 150.0–400.0)
RBC: 4.77 Mil/uL (ref 3.87–5.11)
RDW: 13.7 % (ref 11.5–15.5)
WBC: 10.4 10*3/uL (ref 4.0–10.5)

## 2018-03-17 LAB — URIC ACID: Uric Acid, Serum: 6.3 mg/dL (ref 2.4–7.0)

## 2018-03-17 MED ORDER — PREDNISONE 10 MG PO TABS: ORAL_TABLET | ORAL | 0 refills | Status: DC

## 2018-03-17 NOTE — Patient Instructions (Signed)
Good to see you today  I think you may have a gout flare in your wrist  I have sent in a prednisone prescription for you to start today  Can use heat or ice   Don't wear sling for more than 2 hours at a time, remove regularly and do gentle range of motion

## 2018-03-17 NOTE — Progress Notes (Signed)
Subjective:    Patient ID: Kristine [name], female    DOB: 1940-08-19, 77 y.o.   MRN: [id number]  HPI This is a 77 yo female who presents today with left wrist pain. She is accompanied by her husband. Was sitting at her computer last night when pain started. Noticed redness. Some pain with moving thumb, fingers want to curl up. Resting elbow on leg causes pain into wrist. Took 2 ibuprofen last night and 1 Alleve this am. Was able to sleep last night. Has never had problem with left wrist. Does not recall any trauma or overuse. Pain with dangling arm, has been holding upright for improved comfort. No history of gout.   Past Medical History:  Diagnosis Date  . Allergy   . Anal fissure   . High cholesterol   . Hypertension   . Hypothyroidism   . 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 Thyroid disease    Past Surgical History:  Procedure Laterality Date  . ABDOMINAL HYSTERECTOMY    . BREAST BIOPSY    . BREAST EXCISIONAL BIOPSY Left 1980   benign  . DILATION AND CURETTAGE OF UTERUS    . HEMORROIDECTOMY    . HIP SURGERY  2011   L hip replacement  . LIPOMA EXCISION    . LUMBAR LAMINECTOMY/DECOMPRESSION MICRODISCECTOMY N/A 08/28/2017   Procedure: Decompression L3-5, Excision of synovial cyst L4-5;  Surgeon: Melina Schools, MD;  Location: [facility];  Service: Orthopedics;  Laterality: N/A;  3.5 hrs  . RECTAL POLYPECTOMY  2010  . RECTOCELE REPAIR    . TOTAL ABDOMINAL HYSTERECTOMY W/ BILATERAL SALPINGOOPHORECTOMY    . TUBAL LIGATION     Family History  Problem Relation Age of Onset  . Hyperlipidemia Mother   . Heart disease Mother        heart attack  . Kidney disease Father   . Cancer Sister        multiple myeloma  . Diabetes Brother    Social History   Tobacco Use  . Smoking status: Never Smoker  . Smokeless tobacco: Never Used  Substance Use Topics  . Alcohol use: No  . Drug use: No      Review of Systems Per HPI    Objective:   Physical Exam  Constitutional: She is  oriented to person, place, and time. She appears well-developed and well-nourished. No distress.  HENT:  Head: Normocephalic and atraumatic.  Cardiovascular: Normal rate.  Pulmonary/Chest: Effort normal.  Musculoskeletal:       Left wrist: She exhibits decreased range of motion, tenderness, bony tenderness and swelling.  Left wrist with normal color, + radial pulse, diffuse tenderness on dorsal wrist. Mild swelling of medial aspect. Oblong area erythema distal radial area.   Neurological: She is alert and oriented to person, place, and time.  Skin: She is not diaphoretic.      BP (!) 158/86 (BP Location: Right Arm, Patient Position: Sitting, Cuff Size: Large)   Pulse 68   Temp 97.8 F (36.6 C) (Oral)   Ht '5\' 6"'  (1.676 m)   Wt 234 lb 1.9 oz (106.2 kg)   SpO2 97%   BMI 37.79 kg/m  Wt Readings from Last 3 Encounters:  03/17/18 234 lb 1.9 oz (106.2 kg)  11/19/17 229 lb (103.9 kg)  08/28/17 234 lb (106.1 kg)   Dg Wrist Complete Left  Result Date: 03/17/2018 CLINICAL DATA:  Pain and redness with decreased range of motion EXAM: LEFT WRIST - COMPLETE 3+ VIEW  COMPARISON:  None. FINDINGS: Frontal, oblique, lateral, and ulnar deviation scaphoid images were obtained. No fracture or dislocation. There is extensive osteoarthritic change in the first carpal-metacarpal and scaphotrapezial joints. There is also osteoarthritic change in the triquetrum-hamate joint region. There is cystic change in the proximal scaphoid and medial trapezium bones as well as in the proximal first metacarpal. There are no erosive changes. There is calcification in the triangular fibrocartilage region. No bony destruction evident. There is mild soft tissue swelling. No radiopaque foreign body. Pronator quadratus fat pad does not appear appreciably elevated. IMPRESSION: Multifocal osteoarthritic change. No erosive change or bony destruction. There is soft tissue swelling. Calcification in the triangular fibrocartilage region  may have arthropathic etiology or may represent prior tear in this area. No acute fracture or dislocation. Electronically Signed   By: Lowella Grip III M.D.   On: 03/17/2018 10:03       Assessment & Plan:  1. Left wrist pain - DG Wrist Complete Left; Future  2. Wrist swelling, left - DG Wrist Complete Left; Future - CBC, URIC Acid  - reviewed with Dr. Diona Browner. Do not suspect infectious process, ? Gout/pseudogout.  - Will give course of prednisone (60,50,40,30,20,10), provided a sling for comfort, she will follow up at end of week, sooner if worsening pain, fever, swelling, redness.  Clarene Reamer, FNP-BC   Primary Care at [facility]  03/17/2018 10:01 AM

## 2018-03-18 ENCOUNTER — Encounter: Payer: Self-pay | Admitting: Family Medicine

## 2018-03-21 ENCOUNTER — Encounter (INDEPENDENT_AMBULATORY_CARE_PROVIDER_SITE_OTHER): Payer: Self-pay | Admitting: Bariatrics

## 2018-03-24 ENCOUNTER — Encounter: Payer: Self-pay | Admitting: Gastroenterology

## 2018-03-25 ENCOUNTER — Encounter (INDEPENDENT_AMBULATORY_CARE_PROVIDER_SITE_OTHER): Payer: Federal, State, Local not specified - PPO

## 2018-03-26 ENCOUNTER — Encounter: Payer: Self-pay | Admitting: *Deleted

## 2018-03-27 ENCOUNTER — Encounter: Payer: Self-pay | Admitting: Family Medicine

## 2018-03-27 ENCOUNTER — Ambulatory Visit: Payer: Federal, State, Local not specified - PPO | Admitting: Family Medicine

## 2018-03-27 DIAGNOSIS — G8929 Other chronic pain: Secondary | ICD-10-CM

## 2018-03-27 DIAGNOSIS — M79674 Pain in right toe(s): Secondary | ICD-10-CM

## 2018-03-27 DIAGNOSIS — M25532 Pain in left wrist: Secondary | ICD-10-CM

## 2018-03-27 MED ORDER — COLCHICINE 0.6 MG PO TABS: ORAL_TABLET | ORAL | 2 refills | Status: DC

## 2018-03-27 NOTE — Progress Notes (Signed)
   Subjective:    Patient ID: Kristine [name], female    DOB: [birth year], 77 y.o.   MRN: [id number]  HPI  77 year old female presents for follow up left wrist pain. Started 10 days ago.  Pain is now much better after course of prednisone. Swelling, redness and pain resolved.   CBC   X-ray left wrist:  IMPRESSION: Multifocal osteoarthritic change. No erosive change or bony destruction. There is soft tissue swelling. Calcification in the triangular fibrocartilage region may have arthropathic etiology or may represent prior tear in this area.  No acute fracture or dislocation.  Lab Results  Component Value Date   LABURIC 6.3 03/17/2018   She is also concerned about recurrent toe pain in right foot.. None currently.  Pain is in right anterior foot and toes off and on.. Worse with standing.  2nd digit swells off and on but no redness, heat.  Review of Systems  Constitutional: Negative for fatigue and fever.  HENT: Negative for congestion.   Eyes: Negative for pain.  Respiratory: Negative for cough and shortness of breath.   Cardiovascular: Negative for chest pain, palpitations and leg swelling.  Gastrointestinal: Negative for abdominal pain.  Genitourinary: Negative for dysuria and vaginal bleeding.  Musculoskeletal: Negative for back pain.  Neurological: Negative for syncope, light-headedness and headaches.  Psychiatric/Behavioral: Negative for dysphoric mood.       Objective:   Physical Exam  Constitutional: Vital signs are normal. She appears well-developed and well-nourished. She is cooperative.  Non-toxic appearance. She does not appear ill. No distress.  HENT:  Head: Normocephalic.  Right Ear: Hearing, tympanic membrane, external ear and ear canal normal. Tympanic membrane is not erythematous, not retracted and not bulging.  Left Ear: Hearing, tympanic membrane, external ear and ear canal normal. Tympanic membrane is not erythematous, not retracted and not bulging.    Nose: No mucosal edema or rhinorrhea. Right sinus exhibits no maxillary sinus tenderness and no frontal sinus tenderness. Left sinus exhibits no maxillary sinus tenderness and no frontal sinus tenderness.  Mouth/Throat: Uvula is midline, oropharynx is clear and moist and mucous membranes are normal.  Eyes: Pupils are equal, round, and reactive to light. Conjunctivae, EOM and lids are normal. Lids are everted and swept, no foreign bodies found.  Neck: Trachea normal and normal range of motion. Neck supple. Carotid bruit is not present. No thyroid mass and no thyromegaly present.  Cardiovascular: Normal rate, regular rhythm, S1 normal, S2 normal, normal heart sounds, intact distal pulses and normal pulses. Exam reveals no gallop and no friction rub.  No murmur heard. Pulmonary/Chest: Effort normal and breath sounds normal. No tachypnea. No respiratory distress. She has no decreased breath sounds. She has no wheezes. She has no rhonchi. She has no rales.  Abdominal: Soft. Normal appearance and bowel sounds are normal. There is no tenderness.  Musculoskeletal:  No redness and swelling remains in  Left wrist, only mild tenderness with ROM, no ttp.  Neurological: She is alert.  Skin: Skin is warm, dry and intact. No rash noted.  Psychiatric: Her speech is normal and behavior is normal. Judgment and thought content normal. Her mood appears not anxious. Cognition and memory are normal. She does not exhibit a depressed mood.          Assessment & Plan:

## 2018-03-27 NOTE — Patient Instructions (Addendum)
Return to see podiatrist for toe pain.  Make sure more room in shoe for toes.  In early flare of future pseudogout.. Start colchcine ASAP. Hold cholesterol medication while on.

## 2018-04-01 ENCOUNTER — Encounter: Payer: Self-pay | Admitting: Gastroenterology

## 2018-04-09 ENCOUNTER — Ambulatory Visit (INDEPENDENT_AMBULATORY_CARE_PROVIDER_SITE_OTHER): Payer: Federal, State, Local not specified - PPO | Admitting: Bariatrics

## 2018-04-15 ENCOUNTER — Other Ambulatory Visit: Payer: Self-pay | Admitting: Family Medicine

## 2018-04-17 ENCOUNTER — Ambulatory Visit (AMBULATORY_SURGERY_CENTER): Payer: Self-pay

## 2018-04-17 VITALS — Ht 67.0 in | Wt 236.6 lb

## 2018-04-17 DIAGNOSIS — Z1211 Encounter for screening for malignant neoplasm of colon: Secondary | ICD-10-CM

## 2018-04-17 MED ORDER — NA SULFATE-K SULFATE-MG SULF 17.5-3.13-1.6 GM/177ML PO SOLN: 1.0000 | Freq: Once | ORAL | 0 refills | Status: AC

## 2018-04-17 NOTE — Progress Notes (Signed)
Denies allergies to eggs or soy products. Denies complication of anesthesia or sedation. Denies use of weight loss medication. Denies use of O2.   Emmi instructions declined.   Extra time was spent with the patient reviewing instructions for Suprep. I reiterated the importance of drinking a lot of fluids in order to make her prep work more efficiently.

## 2018-04-21 ENCOUNTER — Encounter: Payer: Self-pay | Admitting: Gastroenterology

## 2018-04-22 ENCOUNTER — Ambulatory Visit (INDEPENDENT_AMBULATORY_CARE_PROVIDER_SITE_OTHER): Payer: Federal, State, Local not specified - PPO | Admitting: Bariatrics

## 2018-04-30 ENCOUNTER — Encounter: Payer: Self-pay | Admitting: Family Medicine

## 2018-04-30 DIAGNOSIS — M25531 Pain in right wrist: Secondary | ICD-10-CM | POA: Insufficient documentation

## 2018-04-30 DIAGNOSIS — M79674 Pain in right toe(s): Secondary | ICD-10-CM

## 2018-04-30 DIAGNOSIS — G8929 Other chronic pain: Secondary | ICD-10-CM | POA: Insufficient documentation

## 2018-04-30 NOTE — Assessment & Plan Note (Signed)
Now resolved... liekly pseudogout.  Reviewed treatment if recurs.  In early flare of future pseudogout.. Start colchcine ASAP. Hold cholesterol medication while on.

## 2018-04-30 NOTE — Assessment & Plan Note (Signed)
Return to see podiatrist for toe pain.  Avoid tight fitting shoes.

## 2018-05-01 ENCOUNTER — Telehealth: Payer: Self-pay | Admitting: Gastroenterology

## 2018-05-01 NOTE — Telephone Encounter (Signed)
Attempted to return phone call to patient twice. Left the following message. Pt does not need to cancel appointment for procedure on Monday due to the fact that she needs to take antibiotics today for a dental appointment tomorrow. Advised to call back if she any further questions.

## 2018-05-01 NOTE — Telephone Encounter (Signed)
Pt needs to take 4 amoxicillins for a dentist procedure tomorrow, she is scheduled for procedure on Monday 05/05/18 and wants to know if she needs to r/s.

## 2018-05-05 ENCOUNTER — Ambulatory Visit (AMBULATORY_SURGERY_CENTER): Payer: Federal, State, Local not specified - PPO | Admitting: Gastroenterology

## 2018-05-05 ENCOUNTER — Encounter: Payer: Self-pay | Admitting: Gastroenterology

## 2018-05-05 VITALS — BP 149/77 | HR 60 | Temp 98.0°F | Resp 22 | Ht 67.0 in | Wt 236.0 lb

## 2018-05-05 DIAGNOSIS — Z1211 Encounter for screening for malignant neoplasm of colon: Secondary | ICD-10-CM

## 2018-05-05 DIAGNOSIS — D122 Benign neoplasm of ascending colon: Secondary | ICD-10-CM

## 2018-05-05 DIAGNOSIS — D120 Benign neoplasm of cecum: Secondary | ICD-10-CM

## 2018-05-05 DIAGNOSIS — D128 Benign neoplasm of rectum: Secondary | ICD-10-CM | POA: Diagnosis not present

## 2018-05-05 DIAGNOSIS — D123 Benign neoplasm of transverse colon: Secondary | ICD-10-CM | POA: Diagnosis not present

## 2018-05-05 DIAGNOSIS — D129 Benign neoplasm of anus and anal canal: Secondary | ICD-10-CM

## 2018-05-05 MED ORDER — SODIUM CHLORIDE 0.9 % IV SOLN: 500.0000 mL | Freq: Once | INTRAVENOUS | Status: DC

## 2018-05-05 NOTE — Progress Notes (Signed)
PT taken to PACU. Monitors in place. VSS. Report given to RN. 

## 2018-05-05 NOTE — Progress Notes (Signed)
Called to room to assist during endoscopic procedure.  Patient ID and intended procedure confirmed with present staff. Received instructions for my participation in the procedure from the performing physician.  

## 2018-05-05 NOTE — Patient Instructions (Addendum)
Information on polyps & hemorrhoids given to you today   Await pathology results from Dr Silverio Decamp   YOU HAD AN ENDOSCOPIC PROCEDURE TODAY AT THE [facility]:   Refer to the procedure report that was given to you for any specific questions about what was found during the examination.  If the procedure report does not answer your questions, please call your gastroenterologist to clarify.  If you requested that your care partner not be given the details of your procedure findings, then the procedure report has been included in a sealed envelope for you to review at your convenience later.  YOU SHOULD EXPECT: Some feelings of bloating in the abdomen. Passage of more gas than usual.  Walking can help get rid of the air that was put into your GI tract during the procedure and reduce the bloating. If you had a lower endoscopy (such as a colonoscopy or flexible sigmoidoscopy) you may notice spotting of blood in your stool or on the toilet paper. If you underwent a bowel prep for your procedure, you may not have a normal bowel movement for a few days.  Please Note:  You might notice some irritation and congestion in your nose or some drainage.  This is from the oxygen used during your procedure.  There is no need for concern and it should clear up in a day or so.  SYMPTOMS TO REPORT IMMEDIATELY:   Following lower endoscopy (colonoscopy or flexible sigmoidoscopy):  Excessive amounts of blood in the stool  Significant tenderness or worsening of abdominal pains  Swelling of the abdomen that is new, acute  Fever of 100F or higher   Following upper endoscopy (EGD)  Vomiting of blood or coffee ground material  New chest pain or pain under the shoulder blades  Painful or persistently difficult swallowing  New shortness of breath  Fever of 100F or higher  Black, tarry-looking stools  For urgent or emergent issues, a gastroenterologist can be reached at any hour by calling [phone]  [phone].   DIET:  We do recommend a small meal at first, but then you may proceed to your regular diet.  Drink plenty of fluids but you should avoid alcoholic beverages for 24 hours.  ACTIVITY:  You should plan to take it easy for the rest of today and you should NOT DRIVE or use heavy machinery until tomorrow (because of the sedation medicines used during the test).    FOLLOW UP: Our staff will call the number listed on your records the next business day following your procedure to check on you and address any questions or concerns that you may have regarding the information given to you following your procedure. If we do not reach you, we will leave a message.  However, if you are feeling well and you are not experiencing any problems, there is no need to return our call.  We will assume that you have returned to your regular daily activities without incident.  If any biopsies were taken you will be contacted by phone or by letter within the next 1-3 weeks.  Please call us at [phone] if you have not heard about the biopsies in 3 weeks.    SIGNATURES/CONFIDENTIALITY: You and/or your care partner have signed paperwork which will be entered into your electronic medical record.  These signatures attest to the fact that that the information above on your After Visit Summary has been reviewed and is understood.  Full responsibility of the confidentiality of this  discharge information lies with you and/or your care-partner. 

## 2018-05-05 NOTE — Op Note (Signed)
[facility] Patient Name: Kristine [name] Procedure Date: 05/05/2018 1:45 PM MRN: [id number] Endoscopist: Mauri Pole , MD Age: 77 Referring MD:  Date of Birth: [birth year] Gender: Female Account #: [id number] Procedure:                Colonoscopy Indications:              Screening for colorectal malignant neoplasm Medicines:                Monitored Anesthesia Care Procedure:                Pre-Anesthesia Assessment:                           - Prior to the procedure, a History and Physical                            was performed, and patient medications and                            allergies were reviewed. The patient's tolerance of                            previous anesthesia was also reviewed. The risks                            and benefits of the procedure and the sedation                            options and risks were discussed with the patient.                            All questions were answered, and informed consent                            was obtained. Prior Anticoagulants: The patient has                            taken no previous anticoagulant or antiplatelet                            agents. ASA Grade Assessment: III - A patient with                            severe systemic disease. After reviewing the risks                            and benefits, the patient was deemed in                            satisfactory condition to undergo the procedure.                           After obtaining informed consent, the colonoscope  was passed under direct vision. Throughout the                            procedure, the patient's blood pressure, pulse, and                            oxygen saturations were monitored continuously. The                            Colonoscope was introduced through the anus and                            advanced to the the cecum, identified by                            appendiceal orifice  and ileocecal valve. The                            colonoscopy was performed without difficulty. The                            patient tolerated the procedure well. The quality                            of the bowel preparation was good. The ileocecal                            valve, appendiceal orifice, and rectum were                            photographed. Scope In: 2:01:23 PM Scope Out: 2:25:23 PM Scope Withdrawal Time: 0 hours 10 minutes 47 seconds  Total Procedure Duration: 0 hours 24 minutes 0 seconds  Findings:                 The perianal and digital rectal examinations were                            normal.                           Three sessile polyps were found in the transverse                            colon, ascending colon and cecum. The polyps were 1                            to 2 mm in size. These polyps were removed with a                            cold biopsy forceps. Resection and retrieval were                            complete.  A 8 mm polyp was found in the rectum. The polyp was                            semi-pedunculated. The polyp was removed with a                            cold snare. Resection and retrieval were complete.                            Coagulation for hemostasis using snare tip was                            successful.                           Anal papilla(e) were hypertrophied.                           Non-bleeding internal hemorrhoids were found during                            retroflexion. The hemorrhoids were small. Complications:            No immediate complications. Estimated Blood Loss:     Estimated blood loss was minimal. Impression:               - Three 1 to 2 mm polyps in the transverse colon,                            in the ascending colon and in the cecum, removed                            with a cold biopsy forceps. Resected and retrieved.                           - One 8 mm polyp  in the rectum, removed with a cold                            snare. Resected and retrieved. Treated with a hot                            snare.                           - Anal papilla(e) were hypertrophied.                           - Non-bleeding internal hemorrhoids. Recommendation:           - Patient has a contact number available for                            emergencies. The signs and symptoms of potential  delayed complications were discussed with the                            patient. Return to normal activities tomorrow.                            Written discharge instructions were provided to the                            patient.                           - Resume previous diet.                           - Continue present medications.                           - Await pathology results.                           - Repeat colonoscopy in 3 - 5 years for                            surveillance based on pathology results. Mauri Pole, MD 05/05/2018 2:30:08 PM This report has been signed electronically.

## 2018-05-06 ENCOUNTER — Telehealth: Payer: Self-pay

## 2018-05-06 ENCOUNTER — Telehealth: Payer: Self-pay | Admitting: *Deleted

## 2018-05-06 NOTE — Telephone Encounter (Signed)
NO ANSWER, MESSAGE LEFT FOR PATIENT. 

## 2018-05-06 NOTE — Telephone Encounter (Signed)
Second follow up call attempt.  Reached voicemail with number identified.  Message left to call if any questions or concerns.

## 2018-05-12 ENCOUNTER — Encounter: Payer: Self-pay | Admitting: Gastroenterology

## 2018-05-13 IMAGING — CR DG SPINE 1V PORT
1 series · 1 of 1 positions shown · non-contrast
Comparison: Lumbar MRI 08/27/2017 and earlier.

CLINICAL DATA: 76-year-old female undergoing lumbar surgery.

EXAM:
PORTABLE SPINE - 1 VIEW

[xtable lateral]
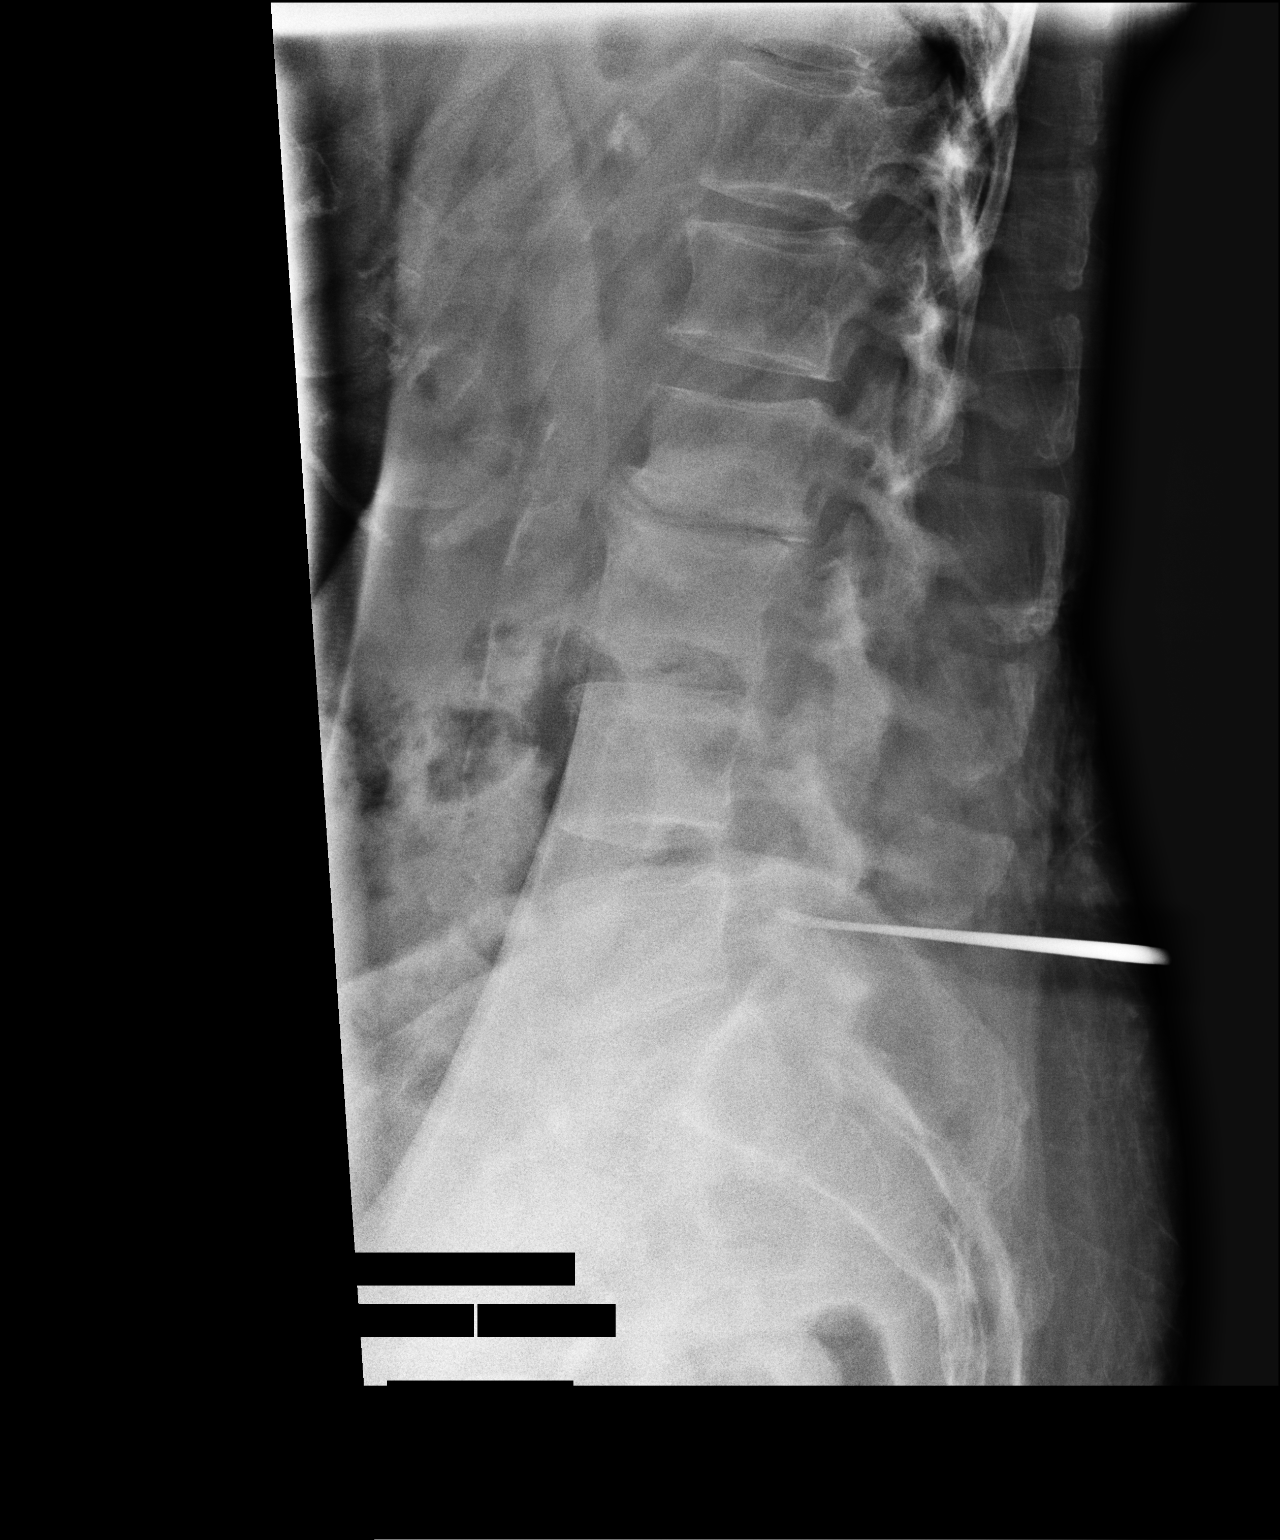

[1 of 1 positions shown; findings below may reference images not displayed]

FINDINGS: Same numbering system as on the MRI yesterday designating normal
lumbar segmentation, which does appear to be valid on this lateral
view.

Intraoperative portable cross-table lateral view of the lumbar spine
at 1398 hours. A surgical probe is directed toward the L5 vertebral
body at the pedicle level, tip projecting over the L5 pars. Advanced
L2-L3 disc space loss Re demonstrated.
IMPRESSION: Intraoperative localization at L5.

## 2018-05-23 ENCOUNTER — Other Ambulatory Visit: Payer: Self-pay | Admitting: Family Medicine

## 2018-05-23 DIAGNOSIS — Z1231 Encounter for screening mammogram for malignant neoplasm of breast: Secondary | ICD-10-CM

## 2018-05-30 ENCOUNTER — Telehealth: Payer: Self-pay | Admitting: Family Medicine

## 2018-05-30 DIAGNOSIS — D509 Iron deficiency anemia, unspecified: Secondary | ICD-10-CM

## 2018-05-30 DIAGNOSIS — R7303 Prediabetes: Secondary | ICD-10-CM

## 2018-05-30 DIAGNOSIS — E7800 Pure hypercholesterolemia, unspecified: Secondary | ICD-10-CM

## 2018-05-30 DIAGNOSIS — E039 Hypothyroidism, unspecified: Secondary | ICD-10-CM

## 2018-05-30 NOTE — Telephone Encounter (Signed)
-----   Message from Ellamae Sia sent at 05/26/2018 10:15 AM EST ----- Regarding: Lab orders for Monday, 1.6.20 Patient is scheduled for CPX labs, please order future labs, Thanks , Karna Christmas

## 2018-06-02 ENCOUNTER — Other Ambulatory Visit (INDEPENDENT_AMBULATORY_CARE_PROVIDER_SITE_OTHER): Payer: Federal, State, Local not specified - PPO

## 2018-06-02 DIAGNOSIS — E7800 Pure hypercholesterolemia, unspecified: Secondary | ICD-10-CM

## 2018-06-02 DIAGNOSIS — R7303 Prediabetes: Secondary | ICD-10-CM

## 2018-06-02 LAB — LIPID PANEL
CHOLESTEROL: 187 mg/dL (ref 0–200)
HDL: 56.1 mg/dL (ref >39.00)
NONHDL: 131.16
TRIGLYCERIDES: 230 mg/dL — AB (ref 0.0–149.0)
Total CHOL/HDL Ratio: 3
VLDL: 46 mg/dL — ABNORMAL HIGH (ref 0.0–40.0)

## 2018-06-02 LAB — COMPREHENSIVE METABOLIC PANEL
ALBUMIN: 3.9 g/dL (ref 3.5–5.2)
ALT: 11 U/L (ref 0–35)
AST: 12 U/L (ref 0–37)
Alkaline Phosphatase: 46 U/L (ref 39–117)
BILIRUBIN TOTAL: 0.5 mg/dL (ref 0.2–1.2)
BUN: 20 mg/dL (ref 6–23)
CALCIUM: 9.1 mg/dL (ref 8.4–10.5)
CO2: 28 mEq/L (ref 19–32)
CREATININE: 0.65 mg/dL (ref 0.40–1.20)
Chloride: 105 mEq/L (ref 96–112)
GFR: 93.6 mL/min (ref >60.00)
Glucose, Bld: 107 mg/dL — ABNORMAL HIGH (ref 70–99)
Potassium: 4 mEq/L (ref 3.5–5.1)
Sodium: 140 mEq/L (ref 135–145)
Total Protein: 6 g/dL (ref 6.0–8.3)

## 2018-06-02 LAB — HEMOGLOBIN A1C: Hgb A1c MFr Bld: 5.8 % (ref 4.6–6.5)

## 2018-06-02 LAB — LDL CHOLESTEROL, DIRECT: Direct LDL: 99 mg/dL

## 2018-06-04 ENCOUNTER — Encounter: Payer: Self-pay | Admitting: *Deleted

## 2018-06-04 ENCOUNTER — Ambulatory Visit
Admission: RE | Admit: 2018-06-04 | Discharge: 2018-06-04 | Disposition: A | Discharge status: Home / Self Care | Payer: Federal, State, Local not specified - PPO | Source: Ambulatory Visit

## 2018-06-04 DIAGNOSIS — Z1231 Encounter for screening mammogram for malignant neoplasm of breast: Secondary | ICD-10-CM

## 2018-06-05 ENCOUNTER — Encounter: Payer: Self-pay | Admitting: Family Medicine

## 2018-06-05 ENCOUNTER — Ambulatory Visit (INDEPENDENT_AMBULATORY_CARE_PROVIDER_SITE_OTHER): Payer: Federal, State, Local not specified - PPO | Admitting: Family Medicine

## 2018-06-05 ENCOUNTER — Other Ambulatory Visit: Payer: Self-pay | Admitting: Family Medicine

## 2018-06-05 VITALS — BP 120/70 | HR 71 | Temp 97.5°F | Ht 65.5 in | Wt 234.5 lb

## 2018-06-05 DIAGNOSIS — Z0000 Encounter for general adult medical examination without abnormal findings: Secondary | ICD-10-CM

## 2018-06-05 DIAGNOSIS — I10 Essential (primary) hypertension: Secondary | ICD-10-CM | POA: Diagnosis not present

## 2018-06-05 DIAGNOSIS — E7800 Pure hypercholesterolemia, unspecified: Secondary | ICD-10-CM | POA: Diagnosis not present

## 2018-06-05 DIAGNOSIS — R7303 Prediabetes: Secondary | ICD-10-CM

## 2018-06-05 DIAGNOSIS — E039 Hypothyroidism, unspecified: Secondary | ICD-10-CM

## 2018-06-05 NOTE — Patient Instructions (Signed)
Work on low [employer], try to exercise as much as you can tolerate.

## 2018-06-05 NOTE — Assessment & Plan Note (Signed)
Work on low [employer].

## 2018-06-05 NOTE — Assessment & Plan Note (Signed)
Well controlled. Continue current medication.  

## 2018-06-05 NOTE — Progress Notes (Signed)
Subjective:    Patient ID: Kristine [name], female    DOB: 1940/08/17, 78 y.o.   MRN: [id number]  HPI  The patient is here for annual wellness exam and preventative care.    She is currently seeing Dr. Nelva Bush and Dr, Rolena Infante for left sciatica. Now on gabapentin 300 mg TID in last day.   Hypertension:   Good control on HCTZ and losartan  Using medication without problems or lightheadedness:  none Chest pain with exertion:none Edema: none Short of breath:none Average home BPs: Other issues:  Elevated Cholesterol: LDL at goal < 100 on simvastatin  Lab Results  Component Value Date   CHOL 187 06/02/2018   HDL 56.10 06/02/2018   LDLCALC 119 (H) 11/12/2017   LDLDIRECT 99.0 06/02/2018   TRIG 230.0 (H) 06/02/2018   CHOLHDL 3 06/02/2018  Using medications without problems: Muscle aches:  Diet compliance: moderate Exercise: walking daily.Marland Kitchen limited at times  Other complaints: Prediabetes  Lab Results  Component Value Date   HGBA1C 5.8 06/02/2018    Hypothyroid   Stable last check on levothyroxine. Lab Results  Component Value Date   TSH 4.66 (H) 11/12/2017     Social History /Family History/Past Medical History reviewed in detail and updated in EMR if needed. Blood pressure 120/70, pulse 71, temperature (!) 97.5 F (36.4 C), temperature source Oral, height 5' 5.5" (1.664 m), weight 234 lb 8 oz (106.4 kg), SpO2 96 %.  Review of Systems  Constitutional: Negative for fatigue and fever.  HENT: Negative for congestion.   Eyes: Negative for pain.  Respiratory: Negative for cough and shortness of breath.   Cardiovascular: Negative for chest pain, palpitations and leg swelling.  Gastrointestinal: Negative for abdominal pain.  Genitourinary: Negative for dysuria and vaginal bleeding.  Musculoskeletal: Negative for back pain.  Neurological: Negative for syncope, light-headedness and headaches.  Psychiatric/Behavioral: Negative for dysphoric mood.       Objective:   Physical  Exam Constitutional:      General: She is not in acute distress.    Appearance: Normal appearance. She is well-developed. She is obese. She is not ill-appearing or toxic-appearing.  HENT:     Head: Normocephalic.     Right Ear: Hearing, tympanic membrane, ear canal and external ear normal. Tympanic membrane is not erythematous, retracted or bulging.     Left Ear: Hearing, tympanic membrane, ear canal and external ear normal. Tympanic membrane is not erythematous, retracted or bulging.     Nose: No mucosal edema or rhinorrhea.     Right Sinus: No maxillary sinus tenderness or frontal sinus tenderness.     Left Sinus: No maxillary sinus tenderness or frontal sinus tenderness.     Mouth/Throat:     Pharynx: Uvula midline.  Eyes:     General: Lids are normal. Lids are everted, no foreign bodies appreciated.     Conjunctiva/sclera: Conjunctivae normal.     Pupils: Pupils are equal, round, and reactive to light.  Neck:     Musculoskeletal: Normal range of motion and neck supple.     Thyroid: No thyroid mass or thyromegaly.     Vascular: No carotid bruit.     Trachea: Trachea normal.  Cardiovascular:     Rate and Rhythm: Normal rate and regular rhythm.     Pulses: Normal pulses.     Heart sounds: Normal heart sounds, S1 normal and S2 normal. No murmur. No friction rub. No gallop.   Pulmonary:     Effort: Pulmonary effort is normal.  No tachypnea or respiratory distress.     Breath sounds: Normal breath sounds. No decreased breath sounds, wheezing, rhonchi or rales.  Abdominal:     General: Bowel sounds are normal.     Palpations: Abdomen is soft.     Tenderness: There is no abdominal tenderness.  Skin:    General: Skin is warm and dry.     Findings: No rash.  Neurological:     Mental Status: She is alert.  Psychiatric:        Mood and Affect: Mood is not anxious or depressed.        Speech: Speech normal.        Behavior: Behavior normal. Behavior is cooperative.        Thought  Content: Thought content normal.        Judgment: Judgment normal.           Assessment & Plan:  The patient's preventative maintenance and recommended screening tests for an annual wellness exam were reviewed in full today. Brought up to date unless services declined.  Counselled on the importance of diet, exercise, and its role in overall health and mortality. The patient's FH and SH was reviewed, including their home life, tobacco status, and drug and alcohol status.   Vaccines:uptodate Td, PNA, zoster. SE to flu in past refused Pap/DVE:Pap/DVE not indicated.  Asymptomatic. Mammo:06/04/2018, plans continuing yearly Bone Density:2017, nml.. Plan repeat in 5 years Colon:04/2018 Dr. Silverio Decamp high grade tubular adenoma.. repeat in 3 years Smoking Status:none

## 2018-06-05 NOTE — Assessment & Plan Note (Signed)
At goal on statin 

## 2018-07-11 ENCOUNTER — Other Ambulatory Visit: Payer: Self-pay | Admitting: Family Medicine

## 2018-08-08 ENCOUNTER — Other Ambulatory Visit: Payer: Self-pay | Admitting: Family Medicine

## 2018-08-11 ENCOUNTER — Other Ambulatory Visit: Payer: Self-pay | Admitting: Family Medicine

## 2018-08-11 NOTE — Telephone Encounter (Signed)
Losartan 100 mg on backorder.  Pharmacy is asking to change to Valsartan 160 mg.  Please advise.

## 2018-08-12 MED ORDER — VALSARTAN 160 MG PO TABS: ORAL_TABLET | ORAL | 11 refills | Status: DC

## 2018-08-12 NOTE — Telephone Encounter (Signed)
Yes continue all other medications.

## 2018-08-12 NOTE — Telephone Encounter (Signed)
Ms. Bradway notified as instructed by telephone.  She is asking does she continue taking the HCTZ with the Valsartan?

## 2018-08-12 NOTE — Telephone Encounter (Signed)
Okay to make change..make sure pt aware to follow BP at home on new med.

## 2018-08-12 NOTE — Addendum Note (Signed)
Addended by: [name] on: 08/12/2018 03:53 PM   Modules accepted: Orders

## 2018-08-12 NOTE — Telephone Encounter (Signed)
Ms. Dirocco notified to continue the HCTZ and all of her other medication per Dr. Diona Browner.

## 2018-09-23 IMAGING — MR MR BRAIN/IAC WO/W
11 of 12 series · 37 of 48 positions shown · IV contrast (20 ml multihance)
Comparison: Prior MRI from 01/13/2017.

CLINICAL DATA: Follow-up examination for left-sided schwannoma.

EXAM:
MRI HEAD WITHOUT AND WITH CONTRAST
TECHNIQUE: Multiplanar, multiecho pulse sequences of the brain and surrounding
structures were obtained without and with intravenous contrast. IAC
protocol was utilized.
CONTRAST:  20mL MULTIHANCE GADOBENATE DIMEGLUMINE 529 MG/ML IV SOLN

[Series 2: T1 · sagittal · 5.0mm · 0.47mm/px · 3 of 21 slices shown (1 of 4)]
[im 1/21]
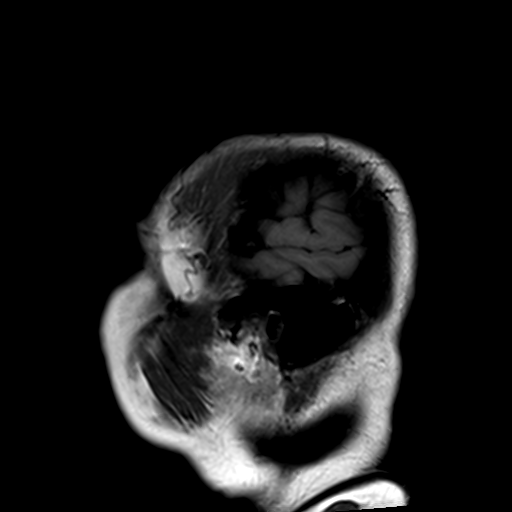
[im 11/21]
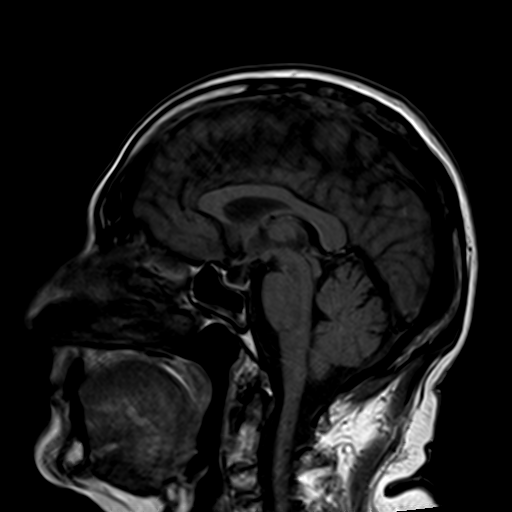
[im 21/21]
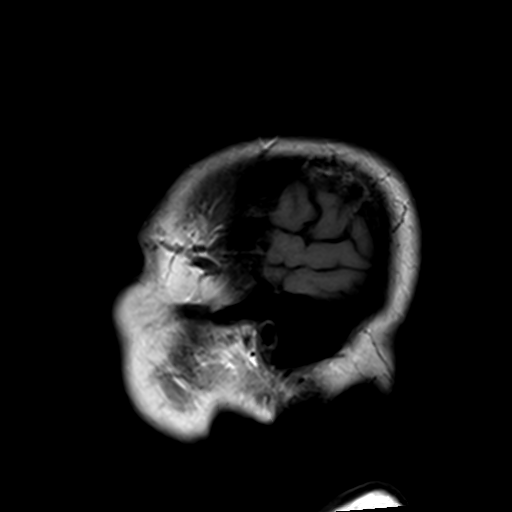

[Series 3: ep2d_diff_(id)_trace · axial · 3.0mm · 1.88mm/px · z∈[-71,+76]mm · 8 of 99 slices shown]
[im 1/99]
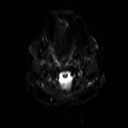
[im 11/99]
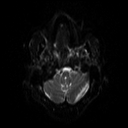
[im 33/99]
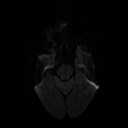
[im 44/99]
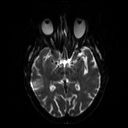
[im 55/99]
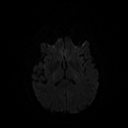
[im 66/99]
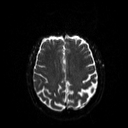
[im 88/99]
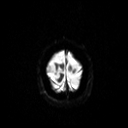
[im 99/99]
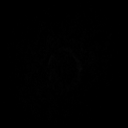

[Series 4: ep2d_diff_(id)_trace_adc · axial · 3.0mm · 1.88mm/px · z∈[-71,+76]mm · 5 of 46 slices shown]
[im 1/46]
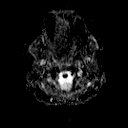
[im 12/46]
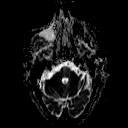
[im 23/46]
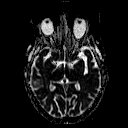
[im 34/46]
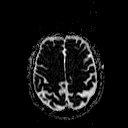
[im 46/46]
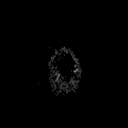

[Series 5: t2_tse_tra_512 · axial · 5.0mm · 0.62mm/px · z∈[-68,+75]mm · 2 of 24 slices shown]
[im 1/24]
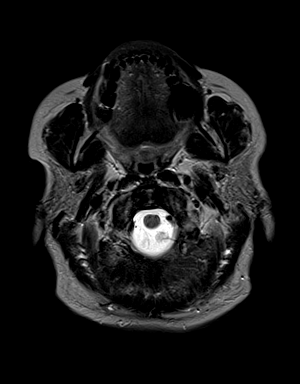
[im 24/24]
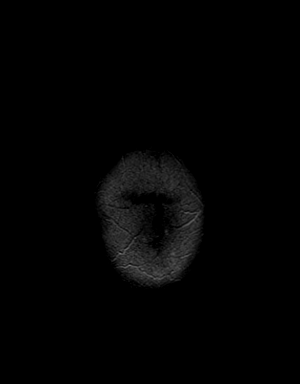

[Series 7: swi_images · axial · 2.0mm · 0.94mm/px · z∈[-76,+82]mm · 8 of 80 slices shown]
[im 1/80]
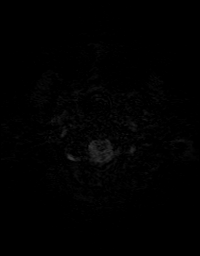
[im 12/80]
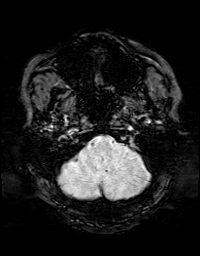
[im 23/80]
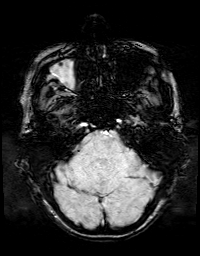
[im 34/80]
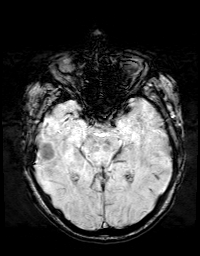
[im 46/80]
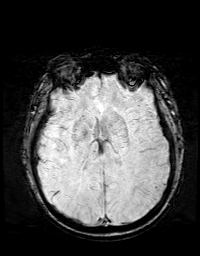
[im 57/80]
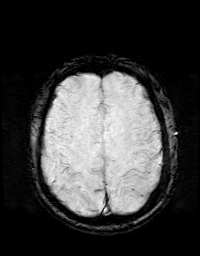
[im 68/80]
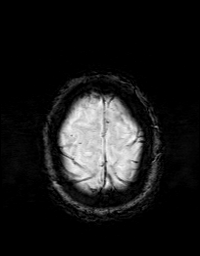
[im 80/80]
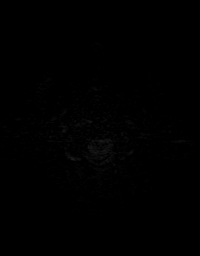

[Series 8: FLAIR · axial · 3.0mm · 0.47mm/px · z∈[-75,+81]mm · 3 of 27 slices shown]
[im 1/27]
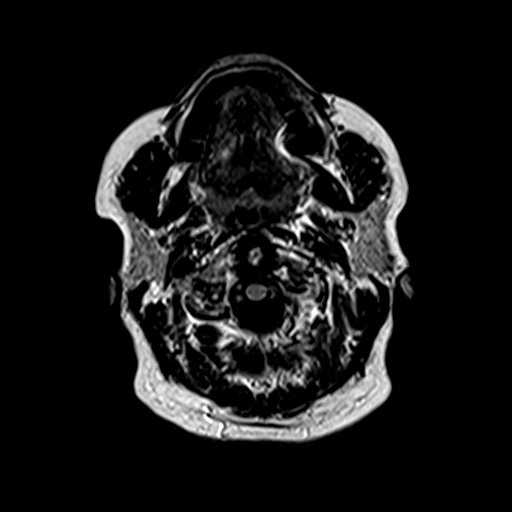
[im 14/27]
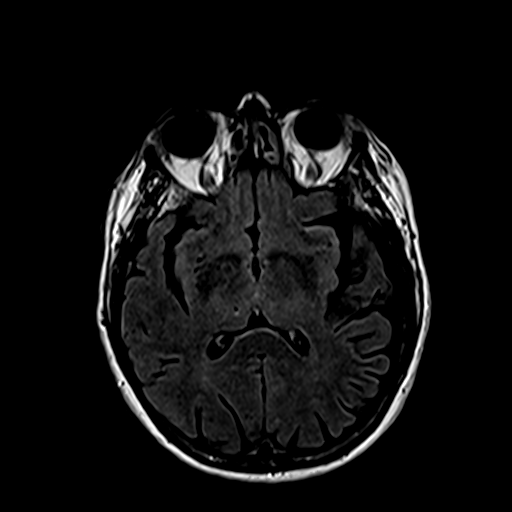
[im 27/27]
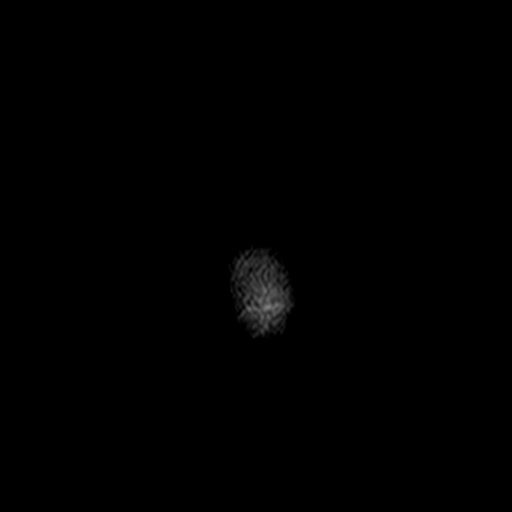

[Series 9: T1 · coronal · 3.0mm · 0.35mm/px · 1 of 11 slices shown (2 of 4)]
[im 1/11]
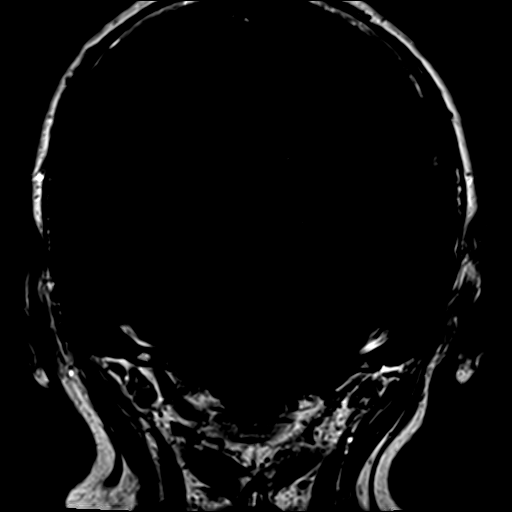

[Series 10: T1 · axial · 3.0mm · 0.35mm/px · 1 of 11 slices shown (3 of 4)]
[im 1/11]
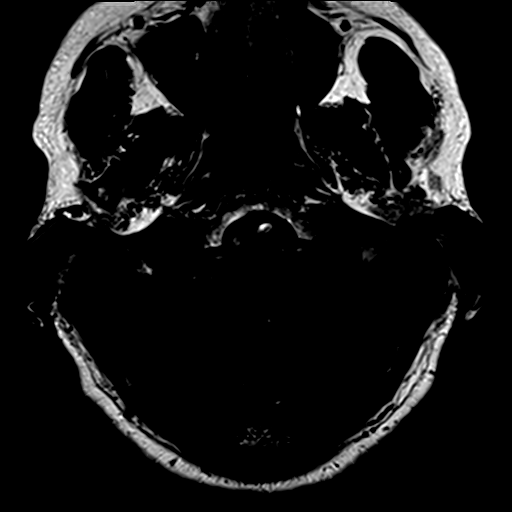

[Series 11: bSSFP · axial · 0.7mm · 0.28mm/px · z∈[-41,-18]mm · 4 of 44 slices shown]
[im 1/44]
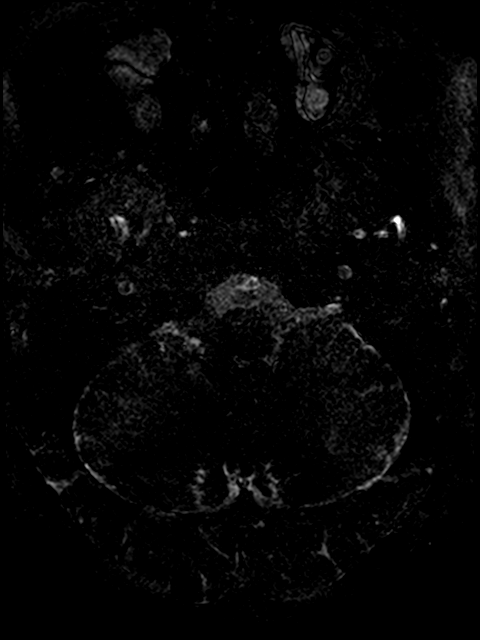
[im 11/44]
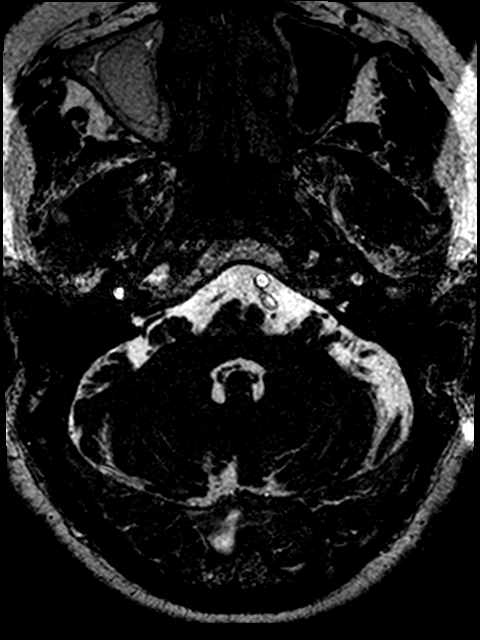
[im 22/44]
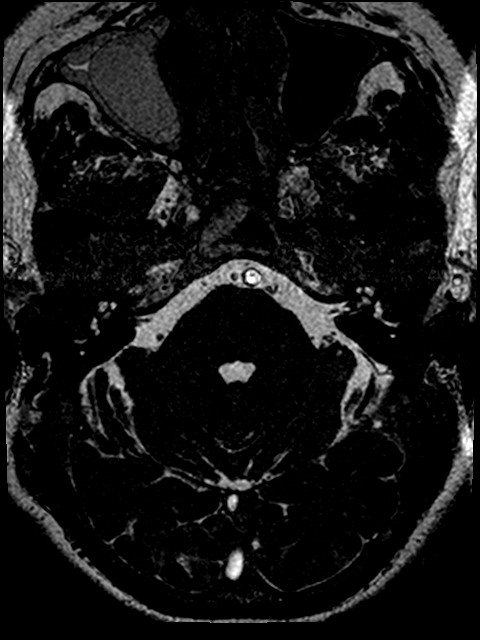
[im 33/44]
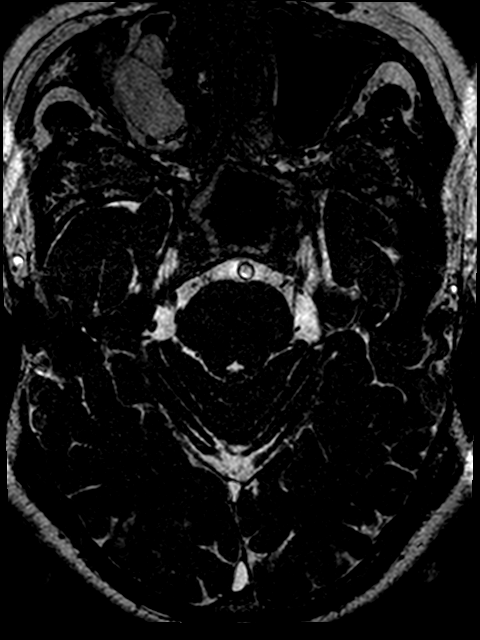

[Series 12: T1 · coronal · 3.0mm · 0.35mm/px · 1 of 11 slices shown (4 of 4)]
[im 1/11]
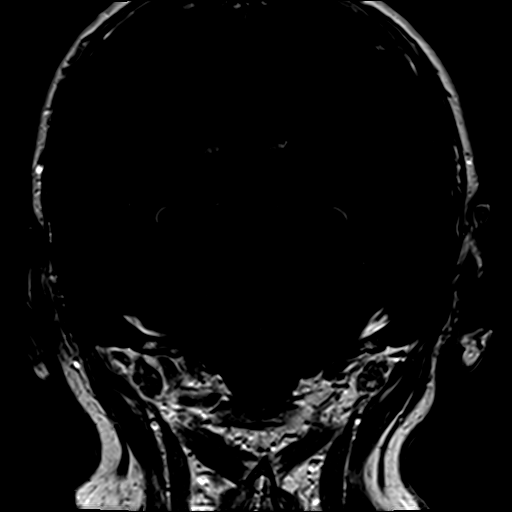

[Series 13: T1 post-contrast · axial · 3.0mm · 0.35mm/px · 1 of 11 slices shown]
[im 1/11]
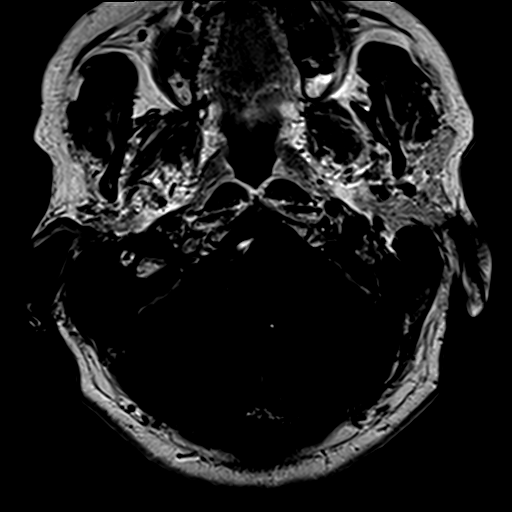

[37 of 48 positions shown; findings below may reference images not displayed]

FINDINGS: Brain: Generalized age-related cerebral atrophy, stable. Remote
right thalamic lacunar infarct again noted. No evidence for acute
infarct. No acute or chronic intracranial hemorrhage. Note made of
an empty sella.

16 x 4 mm left parafalcine meningioma without associated mass
effect, stable from previous.

3 mm focus of nodular enhancement within the left internal auditory
canal, most likely a small schwannoma, not significantly changed
from previous. No other CP angle mass. Inner ear structures
otherwise unremarkable. Trace right mastoid effusion noted.

Vascular: Major intravascular flow voids are well maintained.

Skull and upper cervical spine: Craniocervical junction within
normal limits. Bone marrow signal intensity normal. No scalp soft
tissue abnormality.

Sinuses/Orbits: Globes orbital soft tissues within normal limits.
Pansinus mucosal thickening with superimposed right maxillary sinus
retention cyst.

Other: None.
IMPRESSION: 1. Stable 3 mm mass positioned within the left IAC, most consistent
with a schwannoma.
2. Stable 16 mm left parafalcine meningioma without mass effect.
3. No other new intracranial abnormality.

## 2018-09-25 ENCOUNTER — Encounter: Payer: Self-pay | Admitting: Family Medicine

## 2018-09-25 ENCOUNTER — Ambulatory Visit (INDEPENDENT_AMBULATORY_CARE_PROVIDER_SITE_OTHER): Payer: Federal, State, Local not specified - PPO | Admitting: Family Medicine

## 2018-09-25 DIAGNOSIS — M25531 Pain in right wrist: Secondary | ICD-10-CM

## 2018-09-25 MED ORDER — PREDNISONE 10 MG PO TABS: ORAL_TABLET | ORAL | 0 refills | Status: DC

## 2018-09-25 NOTE — Progress Notes (Signed)
VIRTUAL VISIT Due to national recommendations of social distancing due to [place] 19, a virtual visit is felt to be most appropriate for this patient at this time.   I connected with the patient on 09/25/18 at  3:20 PM EDT by virtual telehealth platform and verified that I am speaking with the correct person using two identifiers.   I discussed the limitations, risks, security and privacy concerns of performing an evaluation and management service by  virtual telehealth platform and the availability of in person appointments. I also discussed with the patient that there may be a patient responsible charge related to this service. The patient expressed understanding and agreed to proceed.  Patient location: Home Provider Location: [facility] Participants: Eliezer Lofts and Clementeen Graham   Chief Complaint  Patient presents with  . 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 started thioa AM at 8 AM.   This feels very similar to the pseudogout she had in her other wrist in the past. She has been wearing a brace on her right wrist.  No redness, mild swelling. ttp per pr in dorsal arist and radial aspect. Decreased ROM in wrist. No fall, no change in activity.   She took a colchicine today , then repeat 1 hour as late.. no improvement.  COVID 19 screen No recent travel or known exposure to COVID19 The patient denies respiratory symptoms of COVID 19 at this time.  The importance of social distancing was discussed today.   Review of Systems  Constitutional: Negative for chills and fever.  HENT: Negative for congestion and ear pain.   Eyes: Negative for pain and redness.  Respiratory: Negative for cough and shortness of breath.   Cardiovascular: Negative for chest pain, palpitations and leg swelling.  Gastrointestinal: Negative for abdominal  pain, blood in stool, constipation, diarrhea, nausea and vomiting.  Genitourinary: Negative for dysuria.  Musculoskeletal: Negative for falls and myalgias.  Skin: Negative for rash.  Neurological: Negative for dizziness.  Psychiatric/Behavioral: Negative for depression. The patient is not nervous/anxious.       Past Medical History:  Diagnosis Date  . Allergy   . Anal fissure   . Anemia   . Arthritis   . Cataract   . GERD (gastroesophageal reflux disease)   . High cholesterol   . Hypertension   . Hypothyroidism   . 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 Thyroid disease     reports that she has never smoked. She has never used smokeless tobacco. She reports that she does not drink alcohol or use drugs.   Current Outpatient Medications:  .  Ca Carbonate-Mag Hydroxide (ROLAIDS PO), Take 1,000 mg by mouth daily as needed (indigestion)., Disp: , Rfl:  .  colchicine 0.6 MG tablet, At sign of flare take 1 tab, repeat in 1 hour then start twice daily until flare gone., Disp: 10 tablet, Rfl: 2 .  cyanocobalamin 2000 MCG tablet, Take 2,000 mcg by mouth 2 (two) times daily., Disp: , Rfl:  .  fexofenadine (ALLEGRA) 180 MG tablet, Take 180 mg by mouth daily. , Disp: , Rfl:  .  fluticasone (FLONASE) 50 MCG/ACT nasal spray, Place 2 sprays into both nostrils daily as needed for allergies. Reported on 10/28/2015, Disp: , Rfl:  .  gabapentin (NEURONTIN) 600 MG tablet, Take 600  mg by mouth 4 (four) times daily. , Disp: , Rfl:  .  hydrochlorothiazide (HYDRODIURIL) 25 MG tablet, TAKE 1 TABLET BY MOUTH EVERY DAY, Disp: 90 tablet, Rfl: 1 .  ibuprofen (ADVIL,MOTRIN) 200 MG tablet, Take 200 mg by mouth every 6 (six) hours as needed. Two 200 mg tablets as needed for pain., Disp: , Rfl:  .  levothyroxine (SYNTHROID) 75 MCG tablet, TAKE 1 TABLET DAILY BEFORE BREAKFAST, Disp: 90 tablet, Rfl: 1 .  montelukast (SINGULAIR) 10 MG tablet, TAKE 1 TABLET AT BEDTIME, Disp: 90 tablet, Rfl: 3 .  Omega-3 Fatty Acids (FISH  OIL) 1200 MG CAPS, Take 2 capsules by mouth 2 (two) times a day., Disp: , Rfl:  .  polyethylene glycol (MIRALAX / GLYCOLAX) packet, Take 17 g by mouth at bedtime. , Disp: , Rfl:  .  PREMARIN 0.625 MG tablet, TAKE 1 TABLET DAILY, Disp: 90 tablet, Rfl: 3 .  simvastatin (ZOCOR) 40 MG tablet, TAKE 1 TABLET (40MG ) BY MOUTH ONCE IN THE EVENING, Disp: 90 tablet, Rfl: 1 .  valsartan (DIOVAN) 160 MG tablet, Take one tablet by mouth daily., Disp: 30 tablet, Rfl: 11   Observations/Objective: Height 5' 5.5" (1.664 m).  Physical Exam  Physical Exam Constitutional:      General: She is not in acute distress. Pulmonary:     Effort: Pulmonary effort is normal. No respiratory distress.  Neurological:     Mental Status: She is alert and oriented to person, place, and time.  Psychiatric:        Mood and Affect: Mood normal.        Behavior: Behavior normal.   Right wrist: limited ROM.Marland Kitchen. minimal redness and mild swelling Assessment and Plan Acute pain of right wrist Likely pseudogout again.  Colchicine and NSAIDs have not helped.  Will send in rx for pred taper.  pt will follow up if not improving as expected.     I discussed the assessment and treatment plan with the patient. The patient was provided an opportunity to ask questions and all were answered. The patient agreed with the plan and demonstrated an understanding of the instructions.   The patient was advised to call back or seek an in-person evaluation if the symptoms worsen or if the condition fails to improve as anticipated.     Eliezer Lofts, MD

## 2018-09-25 NOTE — Assessment & Plan Note (Signed)
Likely pseudogout again.  Colchicine and NSAIDs have not helped.  Will send in rx for pred taper.  pt will follow up if not improving as expected.

## 2018-09-30 ENCOUNTER — Other Ambulatory Visit: Payer: Self-pay | Admitting: Family Medicine

## 2018-10-01 MED ORDER — MIRABEGRON ER 25 MG PO TB24: 25.0000 mg | ORAL_TABLET | Freq: Every day | ORAL | 1 refills | Status: DC

## 2018-10-30 ENCOUNTER — Other Ambulatory Visit: Payer: Self-pay | Admitting: Family Medicine

## 2018-12-01 ENCOUNTER — Telehealth: Payer: Self-pay | Admitting: Family Medicine

## 2018-12-01 ENCOUNTER — Telehealth: Payer: Self-pay

## 2018-12-01 DIAGNOSIS — E039 Hypothyroidism, unspecified: Secondary | ICD-10-CM

## 2018-12-01 DIAGNOSIS — R7303 Prediabetes: Secondary | ICD-10-CM

## 2018-12-01 DIAGNOSIS — D509 Iron deficiency anemia, unspecified: Secondary | ICD-10-CM

## 2018-12-01 DIAGNOSIS — I10 Essential (primary) hypertension: Secondary | ICD-10-CM

## 2018-12-01 NOTE — Telephone Encounter (Signed)
-----   Message from Ellamae Sia sent at 12/01/2018 10:09 AM EDT ----- Regarding: Lab orders for Tuesday, 7.7.20 Lab orders for a 6 month follow up appt

## 2018-12-01 NOTE — Telephone Encounter (Signed)
Left message to call clinic, needs COVID screen and back door lab info   

## 2018-12-02 ENCOUNTER — Other Ambulatory Visit (INDEPENDENT_AMBULATORY_CARE_PROVIDER_SITE_OTHER): Payer: Federal, State, Local not specified - PPO

## 2018-12-02 ENCOUNTER — Other Ambulatory Visit: Payer: Self-pay

## 2018-12-02 DIAGNOSIS — I10 Essential (primary) hypertension: Secondary | ICD-10-CM | POA: Diagnosis not present

## 2018-12-02 DIAGNOSIS — E039 Hypothyroidism, unspecified: Secondary | ICD-10-CM | POA: Diagnosis not present

## 2018-12-02 LAB — COMPREHENSIVE METABOLIC PANEL
ALT: 12 U/L (ref 0–35)
AST: 12 U/L (ref 0–37)
Albumin: 4.2 g/dL (ref 3.5–5.2)
Alkaline Phosphatase: 46 U/L (ref 39–117)
BUN: 19 mg/dL (ref 6–23)
CO2: 27 mEq/L (ref 19–32)
Calcium: 8.8 mg/dL (ref 8.4–10.5)
Chloride: 105 mEq/L (ref 96–112)
Creatinine, Ser: 0.67 mg/dL (ref 0.40–1.20)
GFR: 84.93 mL/min (ref >60.00)
Glucose, Bld: 107 mg/dL — ABNORMAL HIGH (ref 70–99)
Potassium: 4.2 mEq/L (ref 3.5–5.1)
Sodium: 140 mEq/L (ref 135–145)
Total Bilirubin: 0.7 mg/dL (ref 0.2–1.2)
Total Protein: 6 g/dL (ref 6.0–8.3)

## 2018-12-02 LAB — LIPID PANEL
Cholesterol: 159 mg/dL (ref 0–200)
HDL: 52.8 mg/dL (ref >39.00)
LDL Cholesterol: 78 mg/dL (ref 0–99)
NonHDL: 106.12
Total CHOL/HDL Ratio: 3
Triglycerides: 143 mg/dL (ref 0.0–149.0)
VLDL: 28.6 mg/dL (ref 0.0–40.0)

## 2018-12-02 LAB — TSH: TSH: 3.66 u[IU]/mL (ref 0.35–4.50)

## 2018-12-02 LAB — T4, FREE: Free T4: 0.87 ng/dL (ref 0.60–1.60)

## 2018-12-02 LAB — T3, FREE: T3, Free: 2.8 pg/mL (ref 2.3–4.2)

## 2018-12-05 ENCOUNTER — Encounter: Payer: Self-pay | Admitting: Family Medicine

## 2018-12-05 ENCOUNTER — Ambulatory Visit (INDEPENDENT_AMBULATORY_CARE_PROVIDER_SITE_OTHER): Payer: Federal, State, Local not specified - PPO | Admitting: Family Medicine

## 2018-12-05 VITALS — BP 138/77 | HR 69 | Temp 98.5°F | Ht 65.5 in | Wt 235.0 lb

## 2018-12-05 DIAGNOSIS — E039 Hypothyroidism, unspecified: Secondary | ICD-10-CM | POA: Diagnosis not present

## 2018-12-05 DIAGNOSIS — R7303 Prediabetes: Secondary | ICD-10-CM | POA: Diagnosis not present

## 2018-12-05 DIAGNOSIS — N3941 Urge incontinence: Secondary | ICD-10-CM

## 2018-12-05 DIAGNOSIS — E7800 Pure hypercholesterolemia, unspecified: Secondary | ICD-10-CM | POA: Diagnosis not present

## 2018-12-05 DIAGNOSIS — I10 Essential (primary) hypertension: Secondary | ICD-10-CM

## 2018-12-05 MED ORDER — MIRABEGRON ER 50 MG PO TB24: 50.0000 mg | ORAL_TABLET | Freq: Every day | ORAL | 3 refills | Status: DC

## 2018-12-05 NOTE — Assessment & Plan Note (Signed)
Well controlled on statin.

## 2018-12-05 NOTE — Assessment & Plan Note (Signed)
Encouraged exercise ans weight loss

## 2018-12-05 NOTE — Progress Notes (Signed)
VIRTUAL VISIT Due to national recommendations of social distancing due to [place] 19, a virtual visit is felt to be most appropriate for this patient at this time.   I connected with the patient on 12/05/18 at  2:20 PM EDT by virtual telehealth platform and verified that I am speaking with the correct person using two identifiers.   I discussed the limitations, risks, security and privacy concerns of performing an evaluation and management service by  virtual telehealth platform and the availability of in person appointments. I also discussed with the patient that there may be a patient responsible charge related to this service. The patient expressed understanding and agreed to proceed.  Patient location: Home Provider Location: [facility] Participants: Eliezer Lofts and Clementeen Graham   Chief Complaint  Patient presents with  . Follow-up    6 month    History of Present Illness:  78 year old 6 month follow up.  Hypertension:   Good control on HCTZ and losartan BP Readings from Last 3 Encounters:  12/05/18 138/77  06/05/18 120/70  05/05/18 (!) 149/77  Using medication without problems or lightheadedness:none  Chest pain with exertion:none Edema: off and on, better at night Short of breath:none Average home BPs: good Other issues:  Elevated Cholesterol:  LDL at goal on statin and fish oil Lab Results  Component Value Date   CHOL 159 12/02/2018   HDL 52.80 12/02/2018   LDLCALC 78 12/02/2018   LDLDIRECT 99.0 06/02/2018   TRIG 143.0 12/02/2018   CHOLHDL 3 12/02/2018  Using medications without problems:none Muscle aches: none Diet compliance: moderate Exercise: can do minimal walking Other complaints:  Followed by pain managment: Using gabapentin for chronic lumbar pain.  Prediabetes stable.  Hypothyroid  Stable on levothyroxine Lab Results  Component Value Date   TSH 3.66 12/02/2018   Urinary urge incontinence stable on mirbegrona t 50 mg dose. Less  nocturia COVID 19 screen No recent travel or known exposure to [place] The patient denies respiratory symptoms of COVID 19 at this time.  The importance of social distancing was discussed today.   Review of Systems  Constitutional: Negative for chills and fever.  HENT: Negative for congestion and ear pain.   Eyes: Negative for pain and redness.  Respiratory: Negative for cough and shortness of breath.   Cardiovascular: Negative for chest pain, palpitations and leg swelling.  Gastrointestinal: Negative for abdominal pain, blood in stool, constipation, diarrhea, nausea and vomiting.  Genitourinary: Negative for dysuria.  Musculoskeletal: Negative for falls and myalgias.  Skin: Negative for rash.  Neurological: Negative for dizziness.  Psychiatric/Behavioral: Negative for depression. The patient is not nervous/anxious.       Past Medical History:  Diagnosis Date  . Allergy   . Anal fissure   . Anemia   . Arthritis   . Cataract   . GERD (gastroesophageal reflux disease)   . High cholesterol   . Hypertension   . Hypothyroidism   . 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 Thyroid disease     reports that she has never smoked. She has never used smokeless tobacco. She reports that she does not drink alcohol or use drugs.   Current Outpatient Medications:  .  Ca Carbonate-Mag Hydroxide (ROLAIDS PO), Take 1,000 mg by mouth daily as needed (indigestion)., Disp: , Rfl:  .  colchicine 0.6 MG tablet, At sign of flare take 1 tab, repeat in 1 hour then start twice daily until flare gone., Disp: 10 tablet, Rfl:  2 .  cyanocobalamin 2000 MCG tablet, Take 2,000 mcg by mouth 2 (two) times daily., Disp: , Rfl:  .  fexofenadine (ALLEGRA) 180 MG tablet, Take 180 mg by mouth daily. , Disp: , Rfl:  .  fluticasone (FLONASE) 50 MCG/ACT nasal spray, Place 2 sprays into both nostrils daily as needed for allergies. Reported on 10/28/2015, Disp: , Rfl:  .  gabapentin (NEURONTIN) 600 MG tablet, Take 600 mg by  mouth 4 (four) times daily. , Disp: , Rfl:  .  hydrochlorothiazide (HYDRODIURIL) 25 MG tablet, TAKE 1 TABLET BY MOUTH EVERY DAY, Disp: 90 tablet, Rfl: 1 .  ibuprofen (ADVIL,MOTRIN) 200 MG tablet, Take 200 mg by mouth every 6 (six) hours as needed. Two 200 mg tablets as needed for pain., Disp: , Rfl:  .  levothyroxine (SYNTHROID) 75 MCG tablet, TAKE 1 TABLET DAILY BEFORE BREAKFAST, Disp: 90 tablet, Rfl: 1 .  montelukast (SINGULAIR) 10 MG tablet, TAKE 1 TABLET AT BEDTIME, Disp: 90 tablet, Rfl: 3 .  Omega-3 Fatty Acids (FISH OIL) 1200 MG CAPS, Take 2 capsules by mouth 2 (two) times a day., Disp: , Rfl:  .  polyethylene glycol (MIRALAX / GLYCOLAX) packet, Take 17 g by mouth at bedtime. , Disp: , Rfl:  .  PREMARIN 0.625 MG tablet, TAKE 1 TABLET DAILY, Disp: 90 tablet, Rfl: 3 .  simvastatin (ZOCOR) 40 MG tablet, TAKE 1 TABLET (40MG ) BY MOUTH ONCE IN THE EVENING, Disp: 90 tablet, Rfl: 2 .  valsartan (DIOVAN) 160 MG tablet, Take one tablet by mouth daily., Disp: 30 tablet, Rfl: 11   Observations/Objective: Blood pressure 138/77, pulse 69, temperature 98.5 F (36.9 C), temperature source Oral, height 5' 5.5" (1.664 m), weight 235 lb (106.6 kg).  Physical Exam Physical Exam Constitutional:      General: The patient is not in acute distress. Pulmonary:     Effort: Pulmonary effort is normal. No respiratory distress.  Neurological:     Mental Status: The patient is alert and oriented to person, place, and time.  Psychiatric:        Mood and Affect: Mood normal.        Behavior: Behavior normal.    Assessment and Plan  Essential hypertension, benign Well controlled. Continue current medication.   Hypothyroidism Well controlled. Continue current medication.   Prediabetes Encouraged exercise ans weight loss  Pure hypercholesterolemia Well controlled on statin.     I discussed the assessment and treatment plan with the patient. The patient was provided an opportunity to ask questions and  all were answered. The patient agreed with the plan and demonstrated an understanding of the instructions.   The patient was advised to call back or seek an in-person evaluation if the symptoms worsen or if the condition fails to improve as anticipated.     Eliezer Lofts, MD

## 2018-12-05 NOTE — Assessment & Plan Note (Signed)
50 mg of mirbegron helped significantly. No SE.

## 2018-12-05 NOTE — Assessment & Plan Note (Signed)
Well controlled. Continue current medication.  

## 2018-12-27 ENCOUNTER — Other Ambulatory Visit: Payer: Self-pay | Admitting: Family Medicine

## 2019-01-27 ENCOUNTER — Other Ambulatory Visit: Payer: Self-pay | Admitting: Family Medicine

## 2019-02-10 ENCOUNTER — Other Ambulatory Visit: Payer: Self-pay | Admitting: Otolaryngology

## 2019-02-10 DIAGNOSIS — D333 Benign neoplasm of cranial nerves: Secondary | ICD-10-CM

## 2019-02-26 ENCOUNTER — Other Ambulatory Visit: Payer: Self-pay

## 2019-02-26 ENCOUNTER — Ambulatory Visit
Admission: RE | Admit: 2019-02-26 | Discharge: 2019-02-26 | Disposition: A | Discharge status: Home / Self Care | Payer: Federal, State, Local not specified - PPO | Source: Ambulatory Visit | Attending: Otolaryngology | Admitting: Otolaryngology

## 2019-02-26 DIAGNOSIS — D333 Benign neoplasm of cranial nerves: Secondary | ICD-10-CM

## 2019-02-26 MED ORDER — GADOBENATE DIMEGLUMINE 529 MG/ML IV SOLN
20.0000 mL | Freq: Once | INTRAVENOUS | Status: AC | PRN
  Administered 2019-02-26: 17:00:00 20 mL via INTRAVENOUS

## 2019-04-12 ENCOUNTER — Other Ambulatory Visit: Payer: Self-pay | Admitting: Family Medicine

## 2019-05-07 ENCOUNTER — Other Ambulatory Visit: Payer: Self-pay | Admitting: Family Medicine

## 2019-05-07 DIAGNOSIS — Z1231 Encounter for screening mammogram for malignant neoplasm of breast: Secondary | ICD-10-CM

## 2019-06-04 ENCOUNTER — Telehealth: Payer: Self-pay

## 2019-06-04 NOTE — Telephone Encounter (Signed)
LVM to call clinic, pt needs COVID screen, front door and back lab info 1.7.2021 TLJ

## 2019-06-05 ENCOUNTER — Other Ambulatory Visit: Payer: Federal, State, Local not specified - PPO

## 2019-06-08 ENCOUNTER — Telehealth: Payer: Self-pay | Admitting: Family Medicine

## 2019-06-08 DIAGNOSIS — R7303 Prediabetes: Secondary | ICD-10-CM

## 2019-06-08 DIAGNOSIS — E7800 Pure hypercholesterolemia, unspecified: Secondary | ICD-10-CM

## 2019-06-08 DIAGNOSIS — I10 Essential (primary) hypertension: Secondary | ICD-10-CM

## 2019-06-08 NOTE — Telephone Encounter (Signed)
-----   Message from Cloyd Stagers, RT sent at 06/02/2019  3:22 PM EST ----- Regarding: Lab Orders for Tuesday 1.11.2021 Please place lab orders for Tuesday 1.11.2021, office visit for physical on Friday 1.15.2021 Thank you, Dyke Maes RT(R)

## 2019-06-09 ENCOUNTER — Other Ambulatory Visit: Payer: Self-pay

## 2019-06-09 ENCOUNTER — Other Ambulatory Visit (INDEPENDENT_AMBULATORY_CARE_PROVIDER_SITE_OTHER): Payer: Federal, State, Local not specified - PPO

## 2019-06-09 ENCOUNTER — Encounter: Payer: Federal, State, Local not specified - PPO | Admitting: Family Medicine

## 2019-06-09 DIAGNOSIS — I10 Essential (primary) hypertension: Secondary | ICD-10-CM

## 2019-06-09 DIAGNOSIS — R7303 Prediabetes: Secondary | ICD-10-CM

## 2019-06-09 LAB — COMPREHENSIVE METABOLIC PANEL
ALT: 14 U/L (ref 0–35)
AST: 13 U/L (ref 0–37)
Albumin: 4.1 g/dL (ref 3.5–5.2)
Alkaline Phosphatase: 46 U/L (ref 39–117)
BUN: 16 mg/dL (ref 6–23)
CO2: 30 mEq/L (ref 19–32)
Calcium: 9.5 mg/dL (ref 8.4–10.5)
Chloride: 103 mEq/L (ref 96–112)
Creatinine, Ser: 0.66 mg/dL (ref 0.40–1.20)
GFR: 86.3 mL/min (ref >60.00)
Glucose, Bld: 114 mg/dL — ABNORMAL HIGH (ref 70–99)
Potassium: 4.1 mEq/L (ref 3.5–5.1)
Sodium: 140 mEq/L (ref 135–145)
Total Bilirubin: 0.5 mg/dL (ref 0.2–1.2)
Total Protein: 6.2 g/dL (ref 6.0–8.3)

## 2019-06-09 LAB — LIPID PANEL
Cholesterol: 178 mg/dL (ref 0–200)
HDL: 55.6 mg/dL (ref >39.00)
LDL Cholesterol: 85 mg/dL (ref 0–99)
NonHDL: 122.68
Total CHOL/HDL Ratio: 3
Triglycerides: 186 mg/dL — ABNORMAL HIGH (ref 0.0–149.0)
VLDL: 37.2 mg/dL (ref 0.0–40.0)

## 2019-06-09 LAB — HEMOGLOBIN A1C: Hgb A1c MFr Bld: 5.9 % (ref 4.6–6.5)

## 2019-06-09 NOTE — Progress Notes (Signed)
No critical labs need to be addressed urgently. We will discuss labs in detail at upcoming office visit.   

## 2019-06-12 ENCOUNTER — Encounter: Payer: Federal, State, Local not specified - PPO | Admitting: Family Medicine

## 2019-06-12 ENCOUNTER — Other Ambulatory Visit: Payer: Self-pay

## 2019-06-12 ENCOUNTER — Ambulatory Visit: Payer: Federal, State, Local not specified - PPO | Admitting: Family Medicine

## 2019-06-12 ENCOUNTER — Encounter: Payer: Self-pay | Admitting: Family Medicine

## 2019-06-12 VITALS — BP 130/68 | HR 64 | Temp 97.6°F | Ht 65.5 in | Wt 238.5 lb

## 2019-06-12 DIAGNOSIS — I10 Essential (primary) hypertension: Secondary | ICD-10-CM

## 2019-06-12 DIAGNOSIS — Z0000 Encounter for general adult medical examination without abnormal findings: Secondary | ICD-10-CM | POA: Diagnosis not present

## 2019-06-12 DIAGNOSIS — N3941 Urge incontinence: Secondary | ICD-10-CM

## 2019-06-12 DIAGNOSIS — K219 Gastro-esophageal reflux disease without esophagitis: Secondary | ICD-10-CM

## 2019-06-12 DIAGNOSIS — R7303 Prediabetes: Secondary | ICD-10-CM

## 2019-06-12 DIAGNOSIS — E039 Hypothyroidism, unspecified: Secondary | ICD-10-CM | POA: Diagnosis not present

## 2019-06-12 DIAGNOSIS — G609 Hereditary and idiopathic neuropathy, unspecified: Secondary | ICD-10-CM

## 2019-06-12 DIAGNOSIS — E7800 Pure hypercholesterolemia, unspecified: Secondary | ICD-10-CM

## 2019-06-12 DIAGNOSIS — L989 Disorder of the skin and subcutaneous tissue, unspecified: Secondary | ICD-10-CM

## 2019-06-12 NOTE — Assessment & Plan Note (Signed)
If myrbetriq not helping stop.

## 2019-06-12 NOTE — Progress Notes (Signed)
Chief Complaint  Patient presents with  . Annual Exam    History of Present Illness: HPI The patient is here for annual wellness exam and preventative care.    She is having more reflux lately, burning andd reflux ion throat..  No association with foods. Occurring every day.  Burping a lot.  Using ibuprofen 3 times daily for a while now. Using OTC tagamet.. not helping much.   Still with bladder spasm and urge incontinence despite myrbetriq. Med helps some.  No nocturia  Hypertension:   At goal on valsartan , HCTZ BP Readings from Last 3 Encounters:  06/12/19 130/68  12/05/18 138/77  06/05/18 120/70  Using medication without problems or lightheadedness:  none Chest pain with exertion:none Edema:nonenone Short of breath: Average home BPs: Other issues:  Prediabetes;  Lab Results  Component Value Date   HGBA1C 5.9 06/09/2019      Elevated Cholesterol: LDL at goal on simvastatin. Lab Results  Component Value Date   CHOL 178 06/09/2019   HDL 55.60 06/09/2019   LDLCALC 85 06/09/2019   LDLDIRECT 99.0 06/02/2018   TRIG 186.0 (H) 06/09/2019   CHOLHDL 3 06/09/2019  Using medications without problems: Muscle aches:  Diet compliance: moderate Exercise: walking some Other complaints:   This visit occurred during the SARS-CoV-2 public health emergency.  Safety protocols were in place, including screening questions prior to the visit, additional usage of staff PPE, and extensive cleaning of exam room while observing appropriate contact time as indicated for disinfecting solutions.   COVID 19 screen:  No recent travel or known exposure to COVID19 The patient denies respiratory symptoms of COVID 19 at this time. The importance of social distancing was discussed today.   Hypothyroid  Lab Results  Component Value Date   TSH 3.66 12/02/2018      Review of Systems  Constitutional: Negative for chills and fever.  HENT: Negative for congestion and ear pain.   Eyes:  Negative for pain and redness.  Respiratory: Negative for cough and shortness of breath.   Cardiovascular: Negative for chest pain, palpitations and leg swelling.  Gastrointestinal: Negative for abdominal pain, blood in stool, constipation, diarrhea, nausea and vomiting.  Genitourinary: Positive for frequency and urgency. Negative for dysuria.  Musculoskeletal: Negative for falls and myalgias.  Skin: Negative for rash.  Neurological: Negative for dizziness.  Psychiatric/Behavioral: Negative for depression. The patient is not nervous/anxious.       Past Medical History:  Diagnosis Date  . Allergy   . Anal fissure   . Anemia   . Arthritis   . Cataract   . GERD (gastroesophageal reflux disease)   . High cholesterol   . Hypertension   . Hypothyroidism   . 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 Thyroid disease     reports that she has never smoked. She has never used smokeless tobacco. She reports that she does not drink alcohol or use drugs.   Current Outpatient Medications:  .  Ca Carbonate-Mag Hydroxide (ROLAIDS PO), Take 1,000 mg by mouth daily as needed (indigestion)., Disp: , Rfl:  .  colchicine 0.6 MG tablet, At sign of flare take 1 tab, repeat in 1 hour then start twice daily until flare gone., Disp: 10 tablet, Rfl: 2 .  cyanocobalamin 2000 MCG tablet, Take 2,000 mcg by mouth 2 (two) times daily., Disp: , Rfl:  .  fexofenadine (ALLEGRA) 180 MG tablet, Take 180 mg by mouth daily. , Disp: , Rfl:  .  fluticasone (FLONASE) 50  MCG/ACT nasal spray, Place 2 sprays into both nostrils daily as needed for allergies. Reported on 10/28/2015, Disp: , Rfl:  .  gabapentin (NEURONTIN) 600 MG tablet, Take 600 mg by mouth 4 (four) times daily. , Disp: , Rfl:  .  hydrochlorothiazide (HYDRODIURIL) 25 MG tablet, TAKE 1 TABLET BY MOUTH EVERY DAY, Disp: 90 tablet, Rfl: 1 .  ibuprofen (ADVIL,MOTRIN) 200 MG tablet, Take 200 mg by mouth every 6 (six) hours as needed. Two 200 mg tablets as needed for pain.,  Disp: , Rfl:  .  levothyroxine (SYNTHROID) 75 MCG tablet, TAKE 1 TABLET DAILY BEFORE BREAKFAST, Disp: 90 tablet, Rfl: 1 .  mirabegron ER (MYRBETRIQ) 50 MG TB24 tablet, Take 1 tablet (50 mg total) by mouth daily., Disp: 90 tablet, Rfl: 3 .  montelukast (SINGULAIR) 10 MG tablet, TAKE 1 TABLET AT BEDTIME, Disp: 90 tablet, Rfl: 3 .  Omega-3 Fatty Acids (FISH OIL) 1200 MG CAPS, Take 2 capsules by mouth 2 (two) times a day., Disp: , Rfl:  .  polyethylene glycol (MIRALAX / GLYCOLAX) packet, Take 17 g by mouth at bedtime. , Disp: , Rfl:  .  PREMARIN 0.625 MG tablet, TAKE 1 TABLET DAILY, Disp: 90 tablet, Rfl: 3 .  simvastatin (ZOCOR) 40 MG tablet, TAKE 1 TABLET (40MG ) BY MOUTH ONCE IN THE EVENING, Disp: 90 tablet, Rfl: 2 .  valsartan (DIOVAN) 160 MG tablet, Take one tablet by mouth daily., Disp: 30 tablet, Rfl: 11   Observations/Objective: Blood pressure 130/68, pulse 64, temperature 97.6 F (36.4 C), height 5' 5.5" (1.664 m), weight 238 lb 8 oz (108.2 kg), SpO2 96 %.  Physical Exam Constitutional:      General: She is not in acute distress.    Appearance: Normal appearance. She is well-developed. She is not ill-appearing or toxic-appearing.  HENT:     Head: Normocephalic.     Right Ear: Hearing, tympanic membrane, ear canal and external ear normal.     Left Ear: Hearing, tympanic membrane, ear canal and external ear normal.     Nose: Nose normal.  Eyes:     General: Lids are normal. Lids are everted, no foreign bodies appreciated.     Conjunctiva/sclera: Conjunctivae normal.     Pupils: Pupils are equal, round, and reactive to light.  Neck:     Thyroid: No thyroid mass or thyromegaly.     Vascular: No carotid bruit.     Trachea: Trachea normal.  Cardiovascular:     Rate and Rhythm: Normal rate and regular rhythm.     Heart sounds: Normal heart sounds, S1 normal and S2 normal. No murmur. No gallop.   Pulmonary:     Effort: Pulmonary effort is normal. No respiratory distress.     Breath  sounds: Normal breath sounds. No wheezing, rhonchi or rales.  Abdominal:     General: Bowel sounds are normal. There is no distension or abdominal bruit.     Palpations: Abdomen is soft. There is no fluid wave or mass.     Tenderness: There is no abdominal tenderness. There is no guarding or rebound.     Hernia: No hernia is present.  Musculoskeletal:     Cervical back: Normal range of motion and neck supple.  Lymphadenopathy:     Cervical: No cervical adenopathy.  Skin:    General: Skin is warm and dry.     Findings: No rash.  Neurological:     Mental Status: She is alert.     Cranial Nerves: No cranial nerve  deficit.     Sensory: No sensory deficit.  Psychiatric:        Mood and Affect: Mood is not anxious or depressed.        Speech: Speech normal.        Behavior: Behavior normal. Behavior is cooperative.        Judgment: Judgment normal.      Assessment and Plan The patient's preventative maintenance and recommended screening tests for an annual wellness exam were reviewed in full today. Brought up to date unless services declined.  Counselled on the importance of diet, exercise, and its role in overall health and mortality. The patient's FH and SH was reviewed, including their home life, tobacco status, and drug and alcohol status.    Vaccines:uptodate Td, PNA, zoster. SE to flu in past refused Pap/DVE:Pap/DVE not indicated.  asymptomatic Mammo:06/04/2018, plans continuing yearly Bone Density:2017, nml..Plan repeat in 5 years Colon:04/2018 Dr. Silverio Decamp high grade tubular adenoma.. repeat in 3 years Smoking Status:none  Hypothyroidism Well controlled. Continue current medication.   Pure hypercholesterolemia At goal on Simvastatin.  Essential hypertension, benign Well controlled. Continue current medication. Encouraged exercise, weight loss, healthy eating habits.   Prediabetes Good control  With diet changes.  Hereditary and idiopathic peripheral  neuropathy Stable control on gabapentin.  Gastroesophageal reflux disease  PPI x 4-6 weeks.. eval further if not improving.  Stop NSAIDs.  Urge incontinence If myrbetriq not helping stop.    Eliezer Lofts, MD

## 2019-06-12 NOTE — Assessment & Plan Note (Signed)
Stable control on gabapentin.  

## 2019-06-12 NOTE — Assessment & Plan Note (Signed)
At goal on Simvastatin.

## 2019-06-12 NOTE — Assessment & Plan Note (Signed)
Well controlled. Continue current medication. Encouraged exercise, weight loss, healthy eating habits.  

## 2019-06-12 NOTE — Assessment & Plan Note (Signed)
PPI x 4-6 weeks.. eval further if not improving.  Stop NSAIDs.

## 2019-06-12 NOTE — Assessment & Plan Note (Signed)
Good control  With diet changes.

## 2019-06-12 NOTE — Patient Instructions (Addendum)
Decrease or stop ibuproifen and any NSAID.  Work on Stage manager.  Start prilosec 20 mg 2 tabs daily x 4-6 weeks, if not better after 2 weeks call.  Stop myrbetriq if not helping.

## 2019-06-12 NOTE — Assessment & Plan Note (Signed)
Well controlled. Continue current medication.  

## 2019-06-14 ENCOUNTER — Encounter: Payer: Self-pay | Admitting: *Deleted

## 2019-06-14 ENCOUNTER — Ambulatory Visit: Payer: Federal, State, Local not specified - PPO | Attending: Internal Medicine

## 2019-06-14 DIAGNOSIS — Z23 Encounter for immunization: Secondary | ICD-10-CM | POA: Diagnosis present

## 2019-06-14 NOTE — Progress Notes (Unsigned)
   Covid-19 Vaccination Clinic  Name:  ELIOTTE MIDDENDORF    MRN: [id number] DOB: [birth year]  06/14/2019  Ms. Jesus was observed post Covid-19 immunization for 15 minutes without incidence. She was provided with Vaccine Information Sheet and instruction to access the V-Safe system.   Ms. Reise was instructed to call 911 with any severe reactions post vaccine: Marland Kitchen Difficulty breathing  . Swelling of your face and throat  . A fast heartbeat  . A bad rash all over your body  . Dizziness and weakness

## 2019-06-19 ENCOUNTER — Other Ambulatory Visit: Payer: Self-pay | Admitting: Family Medicine

## 2019-06-25 ENCOUNTER — Other Ambulatory Visit: Payer: Self-pay

## 2019-06-25 ENCOUNTER — Ambulatory Visit
Admission: RE | Admit: 2019-06-25 | Discharge: 2019-06-25 | Disposition: A | Discharge status: Home / Self Care | Payer: Federal, State, Local not specified - PPO | Source: Ambulatory Visit | Attending: Family Medicine | Admitting: Family Medicine

## 2019-06-25 DIAGNOSIS — Z1231 Encounter for screening mammogram for malignant neoplasm of breast: Secondary | ICD-10-CM

## 2019-07-05 ENCOUNTER — Ambulatory Visit: Payer: Federal, State, Local not specified - PPO | Attending: Internal Medicine

## 2019-07-05 DIAGNOSIS — Z23 Encounter for immunization: Secondary | ICD-10-CM | POA: Insufficient documentation

## 2019-07-05 NOTE — Progress Notes (Signed)
   Covid-19 Vaccination Clinic  Name:  Kristine [name]    MRN: [id number] DOB: 1940-09-04  07/05/2019  [name] was observed post Covid-19 immunization for 30 minutes based on pre-vaccination screening without incidence. She was provided with Vaccine Information Sheet and instruction to access the V-Safe system.   [name] was instructed to call 911 with any severe reactions post vaccine: Marland Kitchen Difficulty breathing  . Swelling of your face and throat  . A fast heartbeat  . A bad rash all over your body  . Dizziness and weakness    Immunizations Administered    Name Date Dose VIS Date Route   Pfizer COVID-19 Vaccine 07/05/2019 12:05 PM 0.3 mL 05/08/2019 Intramuscular   Manufacturer: [place]   Lot: [id number]   [place]: [id number]

## 2019-07-23 ENCOUNTER — Other Ambulatory Visit: Payer: Self-pay | Admitting: Family Medicine

## 2019-08-02 ENCOUNTER — Other Ambulatory Visit: Payer: Self-pay | Admitting: Family Medicine

## 2019-08-14 MED ORDER — SULINDAC 150 MG PO TABS: 150.0000 mg | ORAL_TABLET | Freq: Two times a day (BID) | ORAL | 0 refills | Status: DC

## 2019-08-18 ENCOUNTER — Telehealth: Payer: Self-pay | Admitting: *Deleted

## 2019-08-18 NOTE — Telephone Encounter (Signed)
[place] with [facility] left a voicemail stating that they received a script for sulindac on 08/14/19. Gerald Stabs stated that an interaction came up with HCTZ which they received that script on 07/23/19. Gerald Stabs stated that the interaction can cause renal insufficiency and decreased diuretic efficacy. Gerald Stabs requested a call back to clarify if patient is to continue the HCTZ with the Sulindac?  When calling back use Ref [id number].

## 2019-08-18 NOTE — Telephone Encounter (Signed)
Form compelted in Donna's box in my office

## 2019-08-18 NOTE — Telephone Encounter (Signed)
There is also a fax in you office in box in regards to this.

## 2019-08-19 NOTE — Telephone Encounter (Signed)
Form faxed to [place] at [phone].

## 2019-08-25 NOTE — Telephone Encounter (Signed)
Clarification form was faxed to [facility] [facility] last week.  I called and spoke with pharmacy tech and he states everything was received and Rx was cleared for refill but for some reason the refill has not been sent out.  He is going to check with his supervisor to try and figure out what is going on with the refill.  Kristine [name] notified of this via telephone.

## 2019-08-25 NOTE — Telephone Encounter (Signed)
Pt left v/m requesting statis of clarification question for Dr Diona Browner for Sulindac 150 mg. Pt said she had spoke with [place] and they have not received a response from our office. Pt request cb.

## 2019-08-26 ENCOUNTER — Other Ambulatory Visit: Payer: Self-pay

## 2019-08-26 ENCOUNTER — Encounter: Payer: Self-pay | Admitting: Ophthalmology

## 2019-09-03 NOTE — Anesthesia Preprocedure Evaluation (Addendum)
Anesthesia Evaluation  Patient identified by MRN, date of birth, ID band Patient awake    Reviewed: Allergy & Precautions, NPO status , Patient's Chart, lab work & pertinent test results, reviewed documented beta blocker date and time   History of Anesthesia Complications Negative for: history of anesthetic complications  Airway Mallampati: II  TM Distance: >3 FB Neck ROM: Full    Dental   Pulmonary    breath sounds clear to auscultation       Cardiovascular hypertension, (-) angina(-) DOE  Rhythm:Regular Rate:Normal   HLD   Neuro/Psych  Headaches,  L auditory nerve schwannoma  Neuromuscular disease (Peripheral neuropathy)    GI/Hepatic GERD  Controlled,  Endo/Other  diabetes (Pre-DM)Hypothyroidism   Renal/GU      Musculoskeletal  (+) Arthritis ,   Abdominal   Peds  Hematology  (+) anemia ,   Anesthesia Other Findings   Reproductive/Obstetrics                            Anesthesia Physical Anesthesia Plan  ASA: III  Anesthesia Plan: MAC   Post-op Pain Management:    Induction: Intravenous  PONV Risk Score and Plan: 2 and TIVA, Midazolam and Treatment may vary due to age or medical condition  Airway Management Planned: Nasal Cannula  Additional Equipment:   Intra-op Plan:   Post-operative Plan:   Informed Consent: I have reviewed the patients History and Physical, chart, labs and discussed the procedure including the risks, benefits and alternatives for the proposed anesthesia with the patient or authorized representative who has indicated his/her understanding and acceptance.       Plan Discussed with: CRNA and Anesthesiologist  Anesthesia Plan Comments:         Anesthesia Quick Evaluation

## 2019-09-04 ENCOUNTER — Other Ambulatory Visit
Admission: RE | Admit: 2019-09-04 | Discharge: 2019-09-04 | Disposition: A | Discharge status: Home / Self Care | Payer: Federal, State, Local not specified - PPO | Source: Ambulatory Visit | Attending: Ophthalmology | Admitting: Ophthalmology

## 2019-09-04 DIAGNOSIS — Z01812 Encounter for preprocedural laboratory examination: Secondary | ICD-10-CM | POA: Diagnosis not present

## 2019-09-04 DIAGNOSIS — Z20822 Contact with and (suspected) exposure to covid-19: Secondary | ICD-10-CM | POA: Insufficient documentation

## 2019-09-04 LAB — SARS CORONAVIRUS 2 (TAT 6-24 HRS): SARS Coronavirus 2: NEGATIVE

## 2019-09-07 NOTE — Discharge Instructions (Signed)

## 2019-09-08 ENCOUNTER — Other Ambulatory Visit: Payer: Self-pay

## 2019-09-08 ENCOUNTER — Ambulatory Visit: Payer: Federal, State, Local not specified - PPO | Admitting: Anesthesiology

## 2019-09-08 ENCOUNTER — Encounter: Payer: Self-pay | Admitting: Ophthalmology

## 2019-09-08 ENCOUNTER — Ambulatory Visit
Admission: RE | Admit: 2019-09-08 | Discharge: 2019-09-08 | Disposition: A | Discharge status: Home / Self Care | Payer: Federal, State, Local not specified - PPO | Attending: Ophthalmology | Admitting: Ophthalmology

## 2019-09-08 ENCOUNTER — Encounter
Admission: RE | Disposition: A | Discharge status: Home / Self Care | Payer: Self-pay | Source: Home / Self Care | Attending: Ophthalmology

## 2019-09-08 DIAGNOSIS — Z791 Long term (current) use of non-steroidal anti-inflammatories (NSAID): Secondary | ICD-10-CM | POA: Diagnosis not present

## 2019-09-08 DIAGNOSIS — G629 Polyneuropathy, unspecified: Secondary | ICD-10-CM | POA: Diagnosis not present

## 2019-09-08 DIAGNOSIS — Z79890 Hormone replacement therapy (postmenopausal): Secondary | ICD-10-CM | POA: Diagnosis not present

## 2019-09-08 DIAGNOSIS — Z96642 Presence of left artificial hip joint: Secondary | ICD-10-CM | POA: Insufficient documentation

## 2019-09-08 DIAGNOSIS — I10 Essential (primary) hypertension: Secondary | ICD-10-CM | POA: Diagnosis not present

## 2019-09-08 DIAGNOSIS — E785 Hyperlipidemia, unspecified: Secondary | ICD-10-CM | POA: Diagnosis not present

## 2019-09-08 DIAGNOSIS — E7800 Pure hypercholesterolemia, unspecified: Secondary | ICD-10-CM | POA: Diagnosis not present

## 2019-09-08 DIAGNOSIS — Z79899 Other long term (current) drug therapy: Secondary | ICD-10-CM | POA: Diagnosis not present

## 2019-09-08 DIAGNOSIS — H2511 Age-related nuclear cataract, right eye: Secondary | ICD-10-CM | POA: Insufficient documentation

## 2019-09-08 DIAGNOSIS — E039 Hypothyroidism, unspecified: Secondary | ICD-10-CM | POA: Insufficient documentation

## 2019-09-08 HISTORY — PX: CATARACT EXTRACTION W/PHACO: SHX586

## 2019-09-08 HISTORY — DX: Dizziness and giddiness: R42

## 2019-09-08 SURGERY — PHACOEMULSIFICATION, CATARACT, WITH IOL INSERTION
Anesthesia: Monitor Anesthesia Care | Site: Eye | Laterality: Right

## 2019-09-08 MED ORDER — NA CHONDROIT SULF-NA HYALURON 40-17 MG/ML IO SOLN
INTRAOCULAR | Status: DC | PRN
  Administered 2019-09-08: 1 mL via INTRAOCULAR

## 2019-09-08 MED ORDER — ARMC OPHTHALMIC DILATING DROPS
1.0000 "application " | OPHTHALMIC | Status: DC | PRN
  Administered 2019-09-08 (×3): 1 via OPHTHALMIC

## 2019-09-08 MED ORDER — LACTATED RINGERS IV SOLN: 100.0000 mL/h | INTRAVENOUS | Status: DC

## 2019-09-08 MED ORDER — TETRACAINE HCL 0.5 % OP SOLN
1.0000 [drp] | OPHTHALMIC | Status: DC | PRN
  Administered 2019-09-08 (×3): 1 [drp] via OPHTHALMIC

## 2019-09-08 MED ORDER — FENTANYL CITRATE (PF) 100 MCG/2ML IJ SOLN
INTRAMUSCULAR | Status: DC | PRN
  Administered 2019-09-08 (×2): 25 ug via INTRAVENOUS
  Administered 2019-09-08: 50 ug via INTRAVENOUS

## 2019-09-08 MED ORDER — LIDOCAINE HCL (PF) 2 % IJ SOLN
INTRAOCULAR | Status: DC | PRN
  Administered 2019-09-08: 1 mL

## 2019-09-08 MED ORDER — MIDAZOLAM HCL 2 MG/2ML IJ SOLN
INTRAMUSCULAR | Status: DC | PRN
  Administered 2019-09-08: 1 mg via INTRAVENOUS
  Administered 2019-09-08 (×2): .5 mg via INTRAVENOUS

## 2019-09-08 MED ORDER — BRIMONIDINE TARTRATE-TIMOLOL 0.2-0.5 % OP SOLN
OPHTHALMIC | Status: DC | PRN
  Administered 2019-09-08: 1 [drp] via OPHTHALMIC

## 2019-09-08 MED ORDER — EPINEPHRINE PF 1 MG/ML IJ SOLN
INTRAOCULAR | Status: DC | PRN
  Administered 2019-09-08: 10:00:00 51 mL via OPHTHALMIC

## 2019-09-08 MED ORDER — MOXIFLOXACIN HCL 0.5 % OP SOLN
OPHTHALMIC | Status: DC | PRN
  Administered 2019-09-08: 0.2 mL via OPHTHALMIC

## 2019-09-08 SURGICAL SUPPLY — 23 items
CANNULA ANT/CHMB 27G (MISCELLANEOUS) ×2 IMPLANT
CANNULA ANT/CHMB 27GA (MISCELLANEOUS) ×6 IMPLANT
DISSECTOR HYDRO NUCLEUS 50X22 (MISCELLANEOUS) ×3 IMPLANT
GLOVE SURG LX 8.0 MICRO (GLOVE) ×2
GLOVE SURG LX STRL 8.0 MICRO (GLOVE) ×1 IMPLANT
GLOVE SURG TRIUMPH 8.0 PF LTX (GLOVE) ×3 IMPLANT
GOWN STRL REUS W/ TWL LRG LVL3 (GOWN DISPOSABLE) ×2 IMPLANT
GOWN STRL REUS W/TWL LRG LVL3 (GOWN DISPOSABLE) ×6
KIT SLEEVE INFUSION .9 MICRO (MISCELLANEOUS) ×2 IMPLANT
LENS IOL ACRSF IQ VT 15 21.5 IMPLANT
LENS IOL ACRYSOF VIVITY 21.5 ×3 IMPLANT
LENS IOL VIVITY 015 21.5 ×1 IMPLANT
MARKER SKIN DUAL TIP RULER LAB (MISCELLANEOUS) ×3 IMPLANT
NDL FILTER BLUNT 18X1 1/2 (NEEDLE) ×1 IMPLANT
NEEDLE FILTER BLUNT 18X 1/2SAF (NEEDLE) ×2
NEEDLE FILTER BLUNT 18X1 1/2 (NEEDLE) ×1 IMPLANT
PACK EYE AFTER SURG (MISCELLANEOUS) ×3 IMPLANT
PACK OPTHALMIC (MISCELLANEOUS) ×3 IMPLANT
PACK PORFILIO (MISCELLANEOUS) ×3 IMPLANT
SYR 3ML LL SCALE MARK (SYRINGE) ×3 IMPLANT
SYR TB 1ML LUER SLIP (SYRINGE) ×3 IMPLANT
WATER STERILE IRR 250ML POUR (IV SOLUTION) ×3 IMPLANT
WIPE NON LINTING 3.25X3.25 (MISCELLANEOUS) ×3 IMPLANT

## 2019-09-08 NOTE — Anesthesia Procedure Notes (Signed)
Procedure Name: MAC Date/Time: 09/08/2019 9:15 AM Performed by: Mayme Genta, CRNA Pre-anesthesia Checklist: Patient identified, Emergency Drugs available, Suction available, Timeout performed and Patient being monitored Patient Re-evaluated:Patient Re-evaluated prior to induction Oxygen Delivery Method: Nasal cannula Placement Confirmation: positive ETCO2

## 2019-09-08 NOTE — Transfer of Care (Signed)
Immediate Anesthesia Transfer of Care Note  Patient: Kristine [name]  Procedure(s) Performed: CATARACT EXTRACTION PHACO AND INTRAOCULAR LENS PLACEMENT (IOC) RIGHT VIVITY LENS 8.89  01:14.2 (Right Eye)  Patient Location: PACU  Anesthesia Type: MAC  Level of Consciousness: awake, alert  and patient cooperative  Airway and Oxygen Therapy: Patient Spontanous Breathing and Patient connected to supplemental oxygen  Post-op Assessment: Post-op Vital signs reviewed, Patient's Cardiovascular Status Stable, Respiratory Function Stable, Patent Airway and No signs of Nausea or vomiting  Post-op Vital Signs: Reviewed and stable  Complications: No apparent anesthesia complications

## 2019-09-08 NOTE — H&P (Signed)
All labs reviewed. Abnormal studies sent to patients PCP when indicated.  Previous H&P reviewed, patient examined, there are NO CHANGES.  Kristine Imhoff Porfilio4/13/20219:07 AM

## 2019-09-08 NOTE — Op Note (Signed)
PREOPERATIVE DIAGNOSIS:  Nuclear sclerotic cataract of the right eye.   POSTOPERATIVE DIAGNOSIS:  * No post-op diagnosis entered *   OPERATIVE PROCEDURE:@   SURGEON:  Birder Robson, MD.   ANESTHESIA:  Anesthesiologist: Heniser, Fredric Dine, MD CRNA: Vanetta Shawl, CRNA  1.      Managed anesthesia care. 2.      0.46ml of Shugarcaine was instilled in the eye following the paracentesis.   COMPLICATIONS:  None.   TECHNIQUE:   Stop and chop   DESCRIPTION OF PROCEDURE:  The patient was examined and consented in the preoperative holding area where the aforementioned topical anesthesia was applied to the right eye and then brought back to the Operating Room where the right eye was prepped and draped in the usual sterile ophthalmic fashion and a lid speculum was placed. A paracentesis was created with the side port blade and the anterior chamber was filled with viscoelastic. A near clear corneal incision was performed with the steel keratome. A continuous curvilinear capsulorrhexis was performed with a cystotome followed by the capsulorrhexis forceps. Hydrodissection and hydrodelineation were carried out with BSS on a blunt cannula. The lens was removed in a stop and chop  technique and the remaining cortical material was removed with the irrigation-aspiration handpiece. The capsular bag was inflated with viscoelastic and the Technis [id number]  lens was placed in the capsular bag without complication. The remaining viscoelastic was removed from the eye with the irrigation-aspiration handpiece. The wounds were hydrated. The anterior chamber was flushed with BSS and the eye was inflated to physiologic pressure. 0.51ml of Vigamox was placed in the anterior chamber. The wounds were found to be water tight. The eye was dressed with Combigan. The patient was given protective glasses to wear throughout the day and a shield with which to sleep tonight. The patient was also given drops with which to begin a drop  regimen today and will follow-up with me in one day. Implant Name Type Inv. Item Serial No. Manufacturer Lot No. LRB No. Used Action  LENS IOL ACRYSOF VIVITY 21.5 - [id number]  LENS IOL ACRYSOF VIVITY 21.5 [id number] ALCON  Right 1 Implanted   Procedure(s) with comments: CATARACT EXTRACTION PHACO AND INTRAOCULAR LENS PLACEMENT (IOC) RIGHT VIVITY LENS 8.89  01:14.2 (Right) - prefers later morning  Electronically signed: Birder Robson 09/08/2019 9:33 AM

## 2019-09-08 NOTE — Anesthesia Postprocedure Evaluation (Signed)
Anesthesia Post Note  Patient: Kristine [name]  Procedure(s) Performed: CATARACT EXTRACTION PHACO AND INTRAOCULAR LENS PLACEMENT (IOC) RIGHT VIVITY LENS 8.89  01:14.2 (Right Eye)     Patient location during evaluation: PACU Anesthesia Type: MAC Level of consciousness: awake and alert Pain management: pain level controlled Vital Signs Assessment: post-procedure vital signs reviewed and stable Respiratory status: spontaneous breathing, nonlabored ventilation, respiratory function stable and patient connected to nasal cannula oxygen Cardiovascular status: stable and blood pressure returned to baseline Postop Assessment: no apparent nausea or vomiting Anesthetic complications: no    Atasha Colebank A  Olayinka Gathers

## 2019-09-09 ENCOUNTER — Encounter: Payer: Self-pay | Admitting: *Deleted

## 2019-09-14 ENCOUNTER — Other Ambulatory Visit: Payer: Self-pay | Admitting: Family Medicine

## 2019-09-24 ENCOUNTER — Encounter: Payer: Self-pay | Admitting: Ophthalmology

## 2019-09-24 ENCOUNTER — Other Ambulatory Visit: Payer: Self-pay

## 2019-09-25 ENCOUNTER — Other Ambulatory Visit
Admission: RE | Admit: 2019-09-25 | Discharge: 2019-09-25 | Disposition: A | Discharge status: Home / Self Care | Payer: Federal, State, Local not specified - PPO | Source: Ambulatory Visit | Attending: Ophthalmology | Admitting: Ophthalmology

## 2019-09-25 DIAGNOSIS — Z20822 Contact with and (suspected) exposure to covid-19: Secondary | ICD-10-CM | POA: Diagnosis not present

## 2019-09-25 DIAGNOSIS — Z01812 Encounter for preprocedural laboratory examination: Secondary | ICD-10-CM | POA: Insufficient documentation

## 2019-09-25 LAB — SARS CORONAVIRUS 2 (TAT 6-24 HRS): SARS Coronavirus 2: NEGATIVE

## 2019-09-29 ENCOUNTER — Encounter: Payer: Self-pay | Admitting: Ophthalmology

## 2019-09-29 ENCOUNTER — Ambulatory Visit: Payer: Federal, State, Local not specified - PPO | Admitting: Anesthesiology

## 2019-09-29 ENCOUNTER — Other Ambulatory Visit: Payer: Self-pay

## 2019-09-29 ENCOUNTER — Encounter
Admission: RE | Disposition: A | Discharge status: Home / Self Care | Payer: Self-pay | Source: Home / Self Care | Attending: Ophthalmology

## 2019-09-29 ENCOUNTER — Ambulatory Visit
Admission: RE | Admit: 2019-09-29 | Discharge: 2019-09-29 | Disposition: A | Discharge status: Home / Self Care | Payer: Federal, State, Local not specified - PPO | Attending: Ophthalmology | Admitting: Ophthalmology

## 2019-09-29 DIAGNOSIS — Z79899 Other long term (current) drug therapy: Secondary | ICD-10-CM | POA: Diagnosis not present

## 2019-09-29 DIAGNOSIS — G629 Polyneuropathy, unspecified: Secondary | ICD-10-CM | POA: Insufficient documentation

## 2019-09-29 DIAGNOSIS — E785 Hyperlipidemia, unspecified: Secondary | ICD-10-CM | POA: Insufficient documentation

## 2019-09-29 DIAGNOSIS — Z96642 Presence of left artificial hip joint: Secondary | ICD-10-CM | POA: Insufficient documentation

## 2019-09-29 DIAGNOSIS — Z79890 Hormone replacement therapy (postmenopausal): Secondary | ICD-10-CM | POA: Insufficient documentation

## 2019-09-29 DIAGNOSIS — E039 Hypothyroidism, unspecified: Secondary | ICD-10-CM | POA: Diagnosis not present

## 2019-09-29 DIAGNOSIS — I10 Essential (primary) hypertension: Secondary | ICD-10-CM | POA: Diagnosis not present

## 2019-09-29 DIAGNOSIS — K219 Gastro-esophageal reflux disease without esophagitis: Secondary | ICD-10-CM | POA: Diagnosis not present

## 2019-09-29 DIAGNOSIS — M109 Gout, unspecified: Secondary | ICD-10-CM | POA: Insufficient documentation

## 2019-09-29 DIAGNOSIS — H2512 Age-related nuclear cataract, left eye: Secondary | ICD-10-CM | POA: Insufficient documentation

## 2019-09-29 DIAGNOSIS — Z6839 Body mass index (BMI) 39.0-39.9, adult: Secondary | ICD-10-CM | POA: Diagnosis not present

## 2019-09-29 DIAGNOSIS — E7800 Pure hypercholesterolemia, unspecified: Secondary | ICD-10-CM | POA: Diagnosis not present

## 2019-09-29 HISTORY — DX: Polyneuropathy, unspecified: G62.9

## 2019-09-29 HISTORY — DX: Chronic sinusitis, unspecified: J32.9

## 2019-09-29 HISTORY — DX: Benign neoplasm, unspecified site: D36.9

## 2019-09-29 HISTORY — DX: Dorsalgia, unspecified: M54.9

## 2019-09-29 HISTORY — PX: CATARACT EXTRACTION W/PHACO: SHX586

## 2019-09-29 SURGERY — PHACOEMULSIFICATION, CATARACT, WITH IOL INSERTION
Anesthesia: Monitor Anesthesia Care | Laterality: Left

## 2019-09-29 MED ORDER — MOXIFLOXACIN HCL 0.5 % OP SOLN
OPHTHALMIC | Status: DC | PRN
  Administered 2019-09-29: 0.2 mL via OPHTHALMIC

## 2019-09-29 MED ORDER — FENTANYL CITRATE (PF) 100 MCG/2ML IJ SOLN
INTRAMUSCULAR | Status: DC | PRN
  Administered 2019-09-29: 50 ug via INTRAVENOUS

## 2019-09-29 MED ORDER — ARMC OPHTHALMIC DILATING DROPS
1.0000 "application " | OPHTHALMIC | Status: DC | PRN
  Administered 2019-09-29 (×3): 1 via OPHTHALMIC

## 2019-09-29 MED ORDER — ONDANSETRON HCL 4 MG/2ML IJ SOLN: 4.0000 mg | Freq: Once | INTRAMUSCULAR | Status: DC | PRN

## 2019-09-29 MED ORDER — NA CHONDROIT SULF-NA HYALURON 40-17 MG/ML IO SOLN
INTRAOCULAR | Status: DC | PRN
  Administered 2019-09-29: 1 mL via INTRAOCULAR

## 2019-09-29 MED ORDER — BRIMONIDINE TARTRATE-TIMOLOL 0.2-0.5 % OP SOLN
OPHTHALMIC | Status: DC | PRN
  Administered 2019-09-29: 1 [drp] via OPHTHALMIC

## 2019-09-29 MED ORDER — LIDOCAINE HCL (PF) 2 % IJ SOLN
INTRAOCULAR | Status: DC | PRN
  Administered 2019-09-29: 2 mL

## 2019-09-29 MED ORDER — MIDAZOLAM HCL 2 MG/2ML IJ SOLN
INTRAMUSCULAR | Status: DC | PRN
  Administered 2019-09-29 (×2): 1 mg via INTRAVENOUS

## 2019-09-29 MED ORDER — TETRACAINE HCL 0.5 % OP SOLN
1.0000 [drp] | OPHTHALMIC | Status: DC | PRN
  Administered 2019-09-29 (×3): 1 [drp] via OPHTHALMIC

## 2019-09-29 MED ORDER — ACETAMINOPHEN 10 MG/ML IV SOLN: 1000.0000 mg | Freq: Once | INTRAVENOUS | Status: DC | PRN

## 2019-09-29 MED ORDER — EPINEPHRINE PF 1 MG/ML IJ SOLN
INTRAOCULAR | Status: DC | PRN
  Administered 2019-09-29: 51 mL via OPHTHALMIC

## 2019-09-29 SURGICAL SUPPLY — 20 items
CANNULA ANT/CHMB 27GA (MISCELLANEOUS) ×6 IMPLANT
DISSECTOR HYDRO NUCLEUS 50X22 (MISCELLANEOUS) ×3 IMPLANT
GLOVE SURG LX 8.0 MICRO (GLOVE) ×2
GLOVE SURG LX STRL 8.0 MICRO (GLOVE) ×1 IMPLANT
GLOVE SURG TRIUMPH 8.0 PF LTX (GLOVE) ×3 IMPLANT
GOWN STRL REUS W/ TWL LRG LVL3 (GOWN DISPOSABLE) ×2 IMPLANT
GOWN STRL REUS W/TWL LRG LVL3 (GOWN DISPOSABLE) ×6
LENS IOL ACRSF VT TRC 315 22.0 ×1 IMPLANT
LENS IOL ACRYSOF VIVITY 22.0 ×3 IMPLANT
LENS IOL VIVITY 315 22.0 ×1 IMPLANT
MARKER SKIN DUAL TIP RULER LAB (MISCELLANEOUS) ×3 IMPLANT
NEEDLE FILTER BLUNT 18X 1/2SAF (NEEDLE) ×2
NEEDLE FILTER BLUNT 18X1 1/2 (NEEDLE) ×1 IMPLANT
PACK EYE AFTER SURG (MISCELLANEOUS) ×3 IMPLANT
PACK OPTHALMIC (MISCELLANEOUS) ×3 IMPLANT
PACK PORFILIO (MISCELLANEOUS) ×3 IMPLANT
SYR 3ML LL SCALE MARK (SYRINGE) ×3 IMPLANT
SYR TB 1ML LUER SLIP (SYRINGE) ×3 IMPLANT
WATER STERILE IRR 250ML POUR (IV SOLUTION) ×3 IMPLANT
WIPE NON LINTING 3.25X3.25 (MISCELLANEOUS) ×3 IMPLANT

## 2019-09-29 NOTE — Transfer of Care (Signed)
Immediate Anesthesia Transfer of Care Note  Patient: Kristine [name]  Procedure(s) Performed: CATARACT EXTRACTION PHACO AND INTRAOCULAR LENS PLACEMENT (IOC) LEFT VIVITY 6.45  00:41.0 (Left )  Patient Location: PACU  Anesthesia Type: MAC  Level of Consciousness: awake, alert  and patient cooperative  Airway and Oxygen Therapy: Patient Spontanous Breathing and Patient connected to supplemental oxygen  Post-op Assessment: Post-op Vital signs reviewed, Patient's Cardiovascular Status Stable, Respiratory Function Stable, Patent Airway and No signs of Nausea or vomiting  Post-op Vital Signs: Reviewed and stable  Complications: No apparent anesthesia complications

## 2019-09-29 NOTE — Anesthesia Preprocedure Evaluation (Signed)
Anesthesia Evaluation  Patient identified by MRN, date of birth, ID band Patient awake    Reviewed: Allergy & Precautions, NPO status , Patient's Chart, lab work & pertinent test results, reviewed documented beta blocker date and time   History of Anesthesia Complications Negative for: history of anesthetic complications  Airway Mallampati: II  TM Distance: >3 FB Neck ROM: Full    Dental no notable dental hx.    Pulmonary neg pulmonary ROS,    Pulmonary exam normal breath sounds clear to auscultation       Cardiovascular Exercise Tolerance: Good hypertension, (-) angina(-) DOE Normal cardiovascular exam Rhythm:Regular Rate:Normal   HLD   Neuro/Psych Neuropathy;  LBP.  Schwannoma  on Left auditory nerve   Neuromuscular disease (Peripheral neuropathy) negative psych ROS   GI/Hepatic Neg liver ROS, GERD  Controlled,  Endo/Other  diabetes (Pre-DM)Hypothyroidism Morbid obesity (bmi 40)  Renal/GU negative Renal ROS  negative genitourinary   Musculoskeletal  (+) Arthritis  (gout), bilat shoulder pain;  LBP   Abdominal   Peds  Hematology negative hematology ROS (+)   Anesthesia Other Findings Covid: NEG.  Reproductive/Obstetrics                             Anesthesia Physical  Anesthesia Plan  ASA: II  Anesthesia Plan: MAC   Post-op Pain Management:    Induction: Intravenous  PONV Risk Score and Plan: 2 and TIVA, Midazolam and Treatment may vary due to age or medical condition  Airway Management Planned: Nasal Cannula  Additional Equipment:   Intra-op Plan:   Post-operative Plan:   Informed Consent: I have reviewed the patients History and Physical, chart, labs and discussed the procedure including the risks, benefits and alternatives for the proposed anesthesia with the patient or authorized representative who has indicated his/her understanding and acceptance.        Plan Discussed with: CRNA and Anesthesiologist  Anesthesia Plan Comments:         Anesthesia Quick Evaluation

## 2019-09-29 NOTE — Op Note (Signed)
PREOPERATIVE DIAGNOSIS:  Nuclear sclerotic cataract of the left eye.   POSTOPERATIVE DIAGNOSIS:  Nuclear sclerotic cataract of the left eye.   OPERATIVE PROCEDURE: Procedure(s): CATARACT EXTRACTION PHACO AND INTRAOCULAR LENS PLACEMENT (IOC) LEFT VIVITY 6.45  00:41.0   SURGEON:  Birder Robson, MD.   ANESTHESIA: 1.      Managed anesthesia care. 2.     0.66ml os Shugarcaine was instilled following the paracentesis [email or web address]   COMPLICATIONS:  None.   TECHNIQUE:   Stop and chop    DESCRIPTION OF PROCEDURE:  The patient was examined and consented in the preoperative holding area where the aforementioned topical anesthesia was applied to the left eye.  The patient was brought back to the Operating Room where he was sat upright on the gurney and given a target to fixate upon while the eye was marked at the 3:00 and 9:00 position.  The patient was then reclined on the operating table.  The eye was prepped and draped in the usual sterile ophthalmic fashion and a lid speculum was placed. A paracentesis was created with the side port blade and the anterior chamber was filled with viscoelastic. A near clear corneal incision was performed with the steel keratome. A continuous curvilinear capsulorrhexis was performed with a cystotome followed by the capsulorrhexis forceps. Hydrodissection and hydrodelineation were carried out with BSS on a blunt cannula. The lens was removed in a stop and chop technique and the remaining cortical material was removed with the irrigation-aspiration handpiece. The eye was inflated with viscoelastic and the DFTlens was placed in the eye and rotated to within a few degrees of the predetermined orientation.  The remaining viscoelastic was removed from the eye.  The [place] hook was used to rotate the toric lens into its final resting place at 158 degrees.  0.1 ml of Vigamox was placed in the anterior chamber. The eye was inflated to a physiologic pressure and found to be  watertight.  The eye was dressed with Vigamox. The patient was given protective glasses to wear throughout the day and a shield with which to sleep tonight. The patient was also given drops with which to begin a drop regimen today and will follow-up with me in one day. Implant Name Type Inv. Item Serial No. Manufacturer Lot No. LRB No. Used Action  LENS IOL ACRYSOF VIVITY 22.0 - [id number]  LENS IOL ACRYSOF VIVITY 22.0 [id number] ALCON  Left 1 Implanted   Procedure(s): CATARACT EXTRACTION PHACO AND INTRAOCULAR LENS PLACEMENT (IOC) LEFT VIVITY 6.45  00:41.0 (Left)  Electronically signed: Birder Robson 5/4/20211:40 PM

## 2019-09-29 NOTE — H&P (Signed)
All labs reviewed. Abnormal studies sent to patients PCP when indicated.  Previous H&P reviewed, patient examined, there are NO CHANGES.  Kristine [name] Porfilio5/4/20211:12 PM

## 2019-09-29 NOTE — Anesthesia Procedure Notes (Signed)
Procedure Name: MAC Performed by: Kaynan Klonowski, CRNA Pre-anesthesia Checklist: Patient identified, Emergency Drugs available, Suction available, Timeout performed and Patient being monitored Patient Re-evaluated:Patient Re-evaluated prior to induction Oxygen Delivery Method: Nasal cannula Placement Confirmation: positive ETCO2       

## 2019-09-29 NOTE — Anesthesia Postprocedure Evaluation (Signed)
Anesthesia Post Note  Patient: Kristine [name]  Procedure(s) Performed: CATARACT EXTRACTION PHACO AND INTRAOCULAR LENS PLACEMENT (IOC) LEFT VIVITY 6.45  00:41.0 (Left )     Patient location during evaluation: PACU Anesthesia Type: MAC Level of consciousness: awake and alert Pain management: pain level controlled Vital Signs Assessment: post-procedure vital signs reviewed and stable Respiratory status: spontaneous breathing, nonlabored ventilation, respiratory function stable and patient connected to nasal cannula oxygen Cardiovascular status: stable and blood pressure returned to baseline Postop Assessment: no apparent nausea or vomiting Anesthetic complications: no    Pearly [place]

## 2019-09-30 ENCOUNTER — Encounter: Payer: Self-pay | Admitting: *Deleted

## 2019-10-02 ENCOUNTER — Ambulatory Visit: Payer: Federal, State, Local not specified - PPO | Admitting: Family Medicine

## 2019-10-02 ENCOUNTER — Ambulatory Visit
Admission: RE | Admit: 2019-10-02 | Discharge: 2019-10-02 | Disposition: A | Discharge status: Home / Self Care | Payer: Federal, State, Local not specified - PPO | Source: Ambulatory Visit | Attending: Family Medicine | Admitting: Family Medicine

## 2019-10-02 ENCOUNTER — Other Ambulatory Visit: Payer: Self-pay

## 2019-10-02 ENCOUNTER — Encounter: Payer: Self-pay | Admitting: Family Medicine

## 2019-10-02 ENCOUNTER — Ambulatory Visit (INDEPENDENT_AMBULATORY_CARE_PROVIDER_SITE_OTHER)
Admission: RE | Admit: 2019-10-02 | Discharge: 2019-10-02 | Disposition: A | Discharge status: Home / Self Care | Payer: Federal, State, Local not specified - PPO | Source: Ambulatory Visit | Attending: Family Medicine | Admitting: Family Medicine

## 2019-10-02 VITALS — BP 140/86 | HR 75 | Temp 98.0°F | Ht 65.5 in | Wt 239.8 lb

## 2019-10-02 DIAGNOSIS — M25511 Pain in right shoulder: Secondary | ICD-10-CM | POA: Diagnosis not present

## 2019-10-02 DIAGNOSIS — M79642 Pain in left hand: Secondary | ICD-10-CM

## 2019-10-02 DIAGNOSIS — M7501 Adhesive capsulitis of right shoulder: Secondary | ICD-10-CM | POA: Insufficient documentation

## 2019-10-02 DIAGNOSIS — M79641 Pain in right hand: Secondary | ICD-10-CM

## 2019-10-02 DIAGNOSIS — M25531 Pain in right wrist: Secondary | ICD-10-CM

## 2019-10-02 DIAGNOSIS — M25532 Pain in left wrist: Secondary | ICD-10-CM

## 2019-10-02 DIAGNOSIS — M7502 Adhesive capsulitis of left shoulder: Secondary | ICD-10-CM

## 2019-10-02 DIAGNOSIS — G8929 Other chronic pain: Secondary | ICD-10-CM | POA: Insufficient documentation

## 2019-10-02 DIAGNOSIS — M25512 Pain in left shoulder: Secondary | ICD-10-CM

## 2019-10-02 MED ORDER — PREDNISONE 20 MG PO TABS: ORAL_TABLET | ORAL | 0 refills | Status: DC

## 2019-10-02 MED ORDER — PREDNISONE 20 MG PO TABS
ORAL_TABLET | ORAL | 0 refills | Status: DC
Start: 2019-10-02 — End: 2019-10-02

## 2019-10-02 NOTE — Progress Notes (Signed)
Chief Complaint  Patient presents with  . Shoulder Pain    Bilateral x 4 months  . Hand Pain    Bilateral  . Arm Pain    Right    History of Present Illness: HPI   79 year old female with history of HTN, chronic low back pain, peripheral neuropathy, past pseudogout ( flares in last year) and osteoarthritis presents for multiple joint issues.   1. Shoulder pain x 4 months bilaterally  hx of fall when young,lef shouldert with decrease in flexibility following. Pain with int and ext rotation painful as well as pain with abduction.  2. bilateral hand pain   Worsened X 4 months  Swleling and pain in left distal IP joints, knuckles on right sore  Deformity in bilateral hands Decreased grip strength in bilateral stiffness.. during the day it improves.  3. Right arm pain sharp pain on hand to wrist to forearm  Bilateral wrist tender as well  Had been on sulindac for years.Marland Kitchen stopped given increased risk.  Now in last few weeks has restarted with minimal benefit.  No family history of autoimmune arthritris known.  no neck pain, but always stiff  no change in activity.    This visit occurred during the SARS-CoV-2 public health emergency.  Safety protocols were in place, including screening questions prior to the visit, additional usage of staff PPE, and extensive cleaning of exam room while observing appropriate contact time as indicated for disinfecting solutions.   COVID 19 screen:  No recent travel or known exposure to COVID19 The patient denies respiratory symptoms of COVID 19 at this time. The importance of social distancing was discussed today.     ROS    Past Medical History:  Diagnosis Date  . Allergy   . Anal fissure   . Anemia   . Arthritis    hands, back, hips  . Back pain    L4 nerve root compression  . Benign tumor    left ear  . Cataract   . GERD (gastroesophageal reflux disease)   . High cholesterol   . Hypertension   . Hypothyroidism   .  Neuropathy   . 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 Sinusitis    chronic  . Thyroid disease   . Vertigo    HX of, last episode over 18 months ago    reports that she has never smoked. She has never used smokeless tobacco. She reports that she does not drink alcohol or use drugs.   Current Outpatient Medications:  .  amoxicillin (AMOXIL) 500 MG capsule, Take 500 mg by mouth as needed. 4 pills when going to the dentist, Disp: , Rfl:  .  Ca Carbonate-Mag Hydroxide (ROLAIDS PO), Take 1,000 mg by mouth daily as needed (indigestion)., Disp: , Rfl:  .  Cyanocobalamin (VITAMIN B-12) 5000 MCG LOZG, Take by mouth., Disp: , Rfl:  .  fexofenadine (ALLEGRA) 180 MG tablet, Take 180 mg by mouth daily. , Disp: , Rfl:  .  fluticasone (FLONASE) 50 MCG/ACT nasal spray, Place 2 sprays into both nostrils daily as needed for allergies. Reported on 10/28/2015, Disp: , Rfl:  .  gabapentin (NEURONTIN) 600 MG tablet, Take 600 mg by mouth 4 (four) times daily. , Disp: , Rfl:  .  hydrochlorothiazide (HYDRODIURIL) 25 MG tablet, TAKE 1 TABLET BY MOUTH EVERY DAY, Disp: 90 tablet, Rfl: 1 .  ibuprofen (ADVIL,MOTRIN) 200 MG tablet, Take 200 mg by mouth every 6 (six) hours as needed.  Two 200 mg tablets as needed for pain., Disp: , Rfl:  .  levothyroxine (SYNTHROID) 75 MCG tablet, TAKE 1 TABLET DAILY BEFORE BREAKFAST, Disp: 90 tablet, Rfl: 0 .  montelukast (SINGULAIR) 10 MG tablet, TAKE 1 TABLET AT BEDTIME, Disp: 90 tablet, Rfl: 3 .  Omega-3 Fatty Acids (FISH OIL) 1200 MG CAPS, Take 2 capsules by mouth 2 (two) times a day., Disp: , Rfl:  .  omeprazole (PRILOSEC) 20 MG capsule, Take 20 mg by mouth daily., Disp: , Rfl:  .  polyethylene glycol (MIRALAX / GLYCOLAX) packet, Take 17 g by mouth at bedtime. , Disp: , Rfl:  .  PREMARIN 0.625 MG tablet, TAKE 1 TABLET DAILY, Disp: 90 tablet, Rfl: 3 .  simvastatin (ZOCOR) 40 MG tablet, TAKE 1 TABLET (40MG ) BY MOUTH ONCE IN THE EVENING, Disp: 90 tablet, Rfl: 3 .  sulindac (CLINORIL) 150  MG tablet, Take 1 tablet (150 mg total) by mouth 2 (two) times daily., Disp: 180 tablet, Rfl: 0 .  valsartan (DIOVAN) 160 MG tablet, TAKE 1 TABLET BY MOUTH EVERY DAY, Disp: 90 tablet, Rfl: 3   Observations/Objective: Blood pressure 140/86, pulse 75, temperature 98 F (36.7 C), temperature source Temporal, height 5' 5.5" (1.664 m), weight 239 lb 12 oz (108.7 kg), SpO2 97 %.  Physical Exam Constitutional:      General: She is not in acute distress.    Appearance: Normal appearance. She is well-developed. She is obese. She is not ill-appearing or toxic-appearing.  HENT:     Head: Normocephalic.     Right Ear: Hearing, tympanic membrane, ear canal and external ear normal. Tympanic membrane is not erythematous, retracted or bulging.     Left Ear: Hearing, tympanic membrane, ear canal and external ear normal. Tympanic membrane is not erythematous, retracted or bulging.     Nose: No mucosal edema or rhinorrhea.     Right Sinus: No maxillary sinus tenderness or frontal sinus tenderness.     Left Sinus: No maxillary sinus tenderness or frontal sinus tenderness.     Mouth/Throat:     Pharynx: Uvula midline.  Eyes:     General: Lids are normal. Lids are everted, no foreign bodies appreciated.     Conjunctiva/sclera: Conjunctivae normal.     Pupils: Pupils are equal, round, and reactive to light.  Neck:     Thyroid: No thyroid mass or thyromegaly.     Vascular: No carotid bruit.     Trachea: Trachea normal.  Cardiovascular:     Rate and Rhythm: Normal rate and regular rhythm.     Pulses: Normal pulses.     Heart sounds: Normal heart sounds, S1 normal and S2 normal. No murmur. No friction rub. No gallop.   Pulmonary:     Effort: Pulmonary effort is normal. No tachypnea or respiratory distress.     Breath sounds: Normal breath sounds. No decreased breath sounds, wheezing, rhonchi or rales.  Abdominal:     General: Bowel sounds are normal.     Palpations: Abdomen is soft.     Tenderness: There  is no abdominal tenderness.  Musculoskeletal:     Right shoulder: Tenderness present. Decreased range of motion.     Left shoulder: Tenderness present. Decreased range of motion.     Right forearm: Tenderness present. No bony tenderness.     Left forearm: Tenderness present. No bony tenderness.     Right wrist: Swelling, deformity and bony tenderness present. Decreased range of motion.     Left wrist: Bony tenderness  present. Decreased range of motion.     Right hand: Swelling, deformity, tenderness and bony tenderness present. Decreased range of motion. Decreased strength. Normal sensation.     Left hand: Swelling, deformity, tenderness and bony tenderness present. Decreased range of motion. Decreased strength. Normal sensation.     Cervical back: Normal range of motion and neck supple.     Comments: ttp in anterior subacromial region bilateral shoulder  She cannot  Int/ext rotate or abduct more than 10-15 degrees  positive empty can, unable to perform drop arm test given limited mobility. postive tinel and pahlen Bilateral wrists   deformity in right wtrist  Nodules and sweling in PIP, DIP joints as well as in MCp joints of 2 and 3rd metacarpal, associated with redness and pain  Decreased grip strength in bilateral hands  unable to fully extend fingers bialterally Left 2nd digit triggers  Skin:    General: Skin is warm and dry.     Findings: No rash.  Neurological:     Mental Status: She is alert.  Psychiatric:        Mood and Affect: Mood is not anxious or depressed.        Speech: Speech normal.        Behavior: Behavior normal. Behavior is cooperative.        Thought Content: Thought content normal.        Judgment: Judgment normal.      Assessment and Plan    Symmetric small joint arthralgia: Eval with hands films and rehum work up.  Shoulders likely adhesive capsulitis in setting of tendonitis vs arthritis.  Will hold NSAIDs, treat with a course of prednsione and eval  with X-ray.  Likely will need referral  To Ortho for steroid injeciton.   Eliezer Lofts, MD

## 2019-10-02 NOTE — Patient Instructions (Addendum)
Start home PT.    Hold sulindac and any ibuprofen aleve. Complete prednisone taper  We will call with X-ray and lab results.

## 2019-10-05 LAB — HIGH SENSITIVITY CRP: hs-CRP: 7.7 mg/L — ABNORMAL HIGH

## 2019-10-05 LAB — SEDIMENTATION RATE: Sed Rate: 6 mm/h (ref 0–30)

## 2019-10-05 LAB — CYCLIC CITRUL PEPTIDE ANTIBODY, IGG: Cyclic Citrullin Peptide Ab: <16 UNITS

## 2019-10-05 LAB — RHEUMATOID FACTOR: Rheumatoid fact SerPl-aCnc: <14 IU/mL (ref <14)

## 2019-10-05 LAB — ANA: Anti Nuclear Antibody (ANA): NEGATIVE

## 2019-11-24 NOTE — Telephone Encounter (Signed)
Can you send this patient a list of hand specialist available in [place]? Via MyChart?

## 2019-11-25 ENCOUNTER — Other Ambulatory Visit: Payer: Self-pay | Admitting: Family Medicine

## 2019-12-08 ENCOUNTER — Other Ambulatory Visit: Payer: Self-pay

## 2019-12-08 ENCOUNTER — Telehealth: Payer: Self-pay | Admitting: Family Medicine

## 2019-12-08 ENCOUNTER — Other Ambulatory Visit (INDEPENDENT_AMBULATORY_CARE_PROVIDER_SITE_OTHER): Payer: Federal, State, Local not specified - PPO

## 2019-12-08 DIAGNOSIS — D509 Iron deficiency anemia, unspecified: Secondary | ICD-10-CM

## 2019-12-08 DIAGNOSIS — R7303 Prediabetes: Secondary | ICD-10-CM

## 2019-12-08 DIAGNOSIS — I10 Essential (primary) hypertension: Secondary | ICD-10-CM

## 2019-12-08 DIAGNOSIS — E039 Hypothyroidism, unspecified: Secondary | ICD-10-CM

## 2019-12-08 LAB — COMPREHENSIVE METABOLIC PANEL
ALT: 10 U/L (ref 0–35)
AST: 10 U/L (ref 0–37)
Albumin: 4.3 g/dL (ref 3.5–5.2)
Alkaline Phosphatase: 57 U/L (ref 39–117)
BUN: 19 mg/dL (ref 6–23)
CO2: 28 mEq/L (ref 19–32)
Calcium: 9.4 mg/dL (ref 8.4–10.5)
Chloride: 103 mEq/L (ref 96–112)
Creatinine, Ser: 0.64 mg/dL (ref 0.40–1.20)
GFR: 89.31 mL/min (ref >60.00)
Glucose, Bld: 121 mg/dL — ABNORMAL HIGH (ref 70–99)
Potassium: 3.9 mEq/L (ref 3.5–5.1)
Sodium: 139 mEq/L (ref 135–145)
Total Bilirubin: 0.5 mg/dL (ref 0.2–1.2)
Total Protein: 6.4 g/dL (ref 6.0–8.3)

## 2019-12-08 LAB — CBC WITH DIFFERENTIAL/PLATELET
Basophils Absolute: 0 10*3/uL (ref 0.0–0.1)
Basophils Relative: 0.5 % (ref 0.0–3.0)
Eosinophils Absolute: 0.3 10*3/uL (ref 0.0–0.7)
Eosinophils Relative: 4.2 % (ref 0.0–5.0)
HCT: 38.7 % (ref 36.0–46.0)
Hemoglobin: 13.1 g/dL (ref 12.0–15.0)
Lymphocytes Relative: 29.3 % (ref 12.0–46.0)
Lymphs Abs: 2.1 10*3/uL (ref 0.7–4.0)
MCHC: 33.8 g/dL (ref 30.0–36.0)
MCV: 83.2 fl (ref 78.0–100.0)
Monocytes Absolute: 0.5 10*3/uL (ref 0.1–1.0)
Monocytes Relative: 6.5 % (ref 3.0–12.0)
Neutro Abs: 4.2 10*3/uL (ref 1.4–7.7)
Neutrophils Relative %: 59.5 % (ref 43.0–77.0)
Platelets: 225 10*3/uL (ref 150.0–400.0)
RBC: 4.65 Mil/uL (ref 3.87–5.11)
RDW: 14.1 % (ref 11.5–15.5)
WBC: 7 10*3/uL (ref 4.0–10.5)

## 2019-12-08 LAB — LIPID PANEL
Cholesterol: 166 mg/dL (ref 0–200)
HDL: 55.8 mg/dL (ref >39.00)
LDL Cholesterol: 79 mg/dL (ref 0–99)
NonHDL: 110.43
Total CHOL/HDL Ratio: 3
Triglycerides: 159 mg/dL — ABNORMAL HIGH (ref 0.0–149.0)
VLDL: 31.8 mg/dL (ref 0.0–40.0)

## 2019-12-08 LAB — TSH: TSH: 4.66 u[IU]/mL — ABNORMAL HIGH (ref 0.35–4.50)

## 2019-12-08 LAB — T3, FREE: T3, Free: 2.9 pg/mL (ref 2.3–4.2)

## 2019-12-08 LAB — T4, FREE: Free T4: 1.08 ng/dL (ref 0.60–1.60)

## 2019-12-08 LAB — HEMOGLOBIN A1C: Hgb A1c MFr Bld: 6.1 % (ref 4.6–6.5)

## 2019-12-08 NOTE — Telephone Encounter (Signed)
-----   Message from Cloyd Stagers, RT sent at 11/24/2019  2:08 PM EDT ----- Regarding: Lab Orders for Tuesday 7.13.2021 Please place lab orders for Tuesday 7.13.2021, office visit for 6 month f/u on Tuesday 7.20.2021 Thank you, Dyke Maes RT(R)

## 2019-12-08 NOTE — Progress Notes (Signed)
No critical labs need to be addressed urgently. We will discuss labs in detail at upcoming office visit.   

## 2019-12-11 ENCOUNTER — Other Ambulatory Visit: Payer: Self-pay | Admitting: Family Medicine

## 2019-12-15 ENCOUNTER — Encounter: Payer: Self-pay | Admitting: Family Medicine

## 2019-12-15 ENCOUNTER — Ambulatory Visit: Payer: Federal, State, Local not specified - PPO | Admitting: Family Medicine

## 2019-12-15 ENCOUNTER — Other Ambulatory Visit: Payer: Self-pay

## 2019-12-15 VITALS — BP 128/62 | HR 78 | Temp 97.6°F | Ht 65.5 in | Wt 235.5 lb

## 2019-12-15 DIAGNOSIS — E7800 Pure hypercholesterolemia, unspecified: Secondary | ICD-10-CM | POA: Diagnosis not present

## 2019-12-15 DIAGNOSIS — G8929 Other chronic pain: Secondary | ICD-10-CM

## 2019-12-15 DIAGNOSIS — E039 Hypothyroidism, unspecified: Secondary | ICD-10-CM

## 2019-12-15 DIAGNOSIS — M5440 Lumbago with sciatica, unspecified side: Secondary | ICD-10-CM

## 2019-12-15 DIAGNOSIS — R195 Other fecal abnormalities: Secondary | ICD-10-CM | POA: Diagnosis not present

## 2019-12-15 DIAGNOSIS — N3941 Urge incontinence: Secondary | ICD-10-CM

## 2019-12-15 DIAGNOSIS — I10 Essential (primary) hypertension: Secondary | ICD-10-CM | POA: Diagnosis not present

## 2019-12-15 DIAGNOSIS — M2550 Pain in unspecified joint: Secondary | ICD-10-CM | POA: Insufficient documentation

## 2019-12-15 NOTE — Progress Notes (Signed)
Chief Complaint  Patient presents with   Follow-up    6 month    History of Present Illness: HPI   79 year old female presents for 6 month follow up.  Hypertension:    At goal on current medication BP Readings from Last 3 Encounters:  12/15/19 128/62  10/02/19 140/86  09/29/19 (!) 170/82  Using medication without problems or lightheadedness:  none Chest pain with exertion:none Edema:none Short of breath:none Average home BPs: Other issues:  Elevated Cholesterol: LDL at goal on statin Lab Results  Component Value Date   CHOL 166 12/08/2019   HDL 55.80 12/08/2019   LDLCALC 79 12/08/2019   LDLDIRECT 99.0 06/02/2018   TRIG 159.0 (H) 12/08/2019   CHOLHDL 3 12/08/2019  Using medications without problems: Muscle aches:  Diet compliance: moderate Exercise: limited Other complaints:  Adhesive capsulitis of shoulder  Bilateral hand pain  Neg Rheum eval. She is still having issues in these areas.  She has not seen ortho as recommended.  Sulindac does not help much.Marland Kitchen ibuprofen helps more.  She has upcoming spinal injection.. L4 nerve block.  Urinary urgency and frequency.Marland Kitchen only goes small amount when she urinated, sometime has to stop to get all urine out. No issue having to push urine out.  Myrbetriq.. no benefit at higher dose.    Stool has been looser over last 3-4 months.. darker stool off and on. Hx pof polyps in 2019. Repeat every 3 year, no family history of [place] CA  no abd pain, GERD controlled with prilosec.  This visit occurred during the SARS-CoV-2 public health emergency.  Safety protocols were in place, including screening questions prior to the visit, additional usage of staff PPE, and extensive cleaning of exam room while observing appropriate contact time as indicated for disinfecting solutions.   COVID 19 screen:  No recent travel or known exposure to COVID19 The patient denies respiratory symptoms of COVID 19 at this time. The importance of social  distancing was discussed today.     Review of Systems  Constitutional: Negative for chills and fever.  HENT: Negative for congestion and ear pain.   Eyes: Negative for pain and redness.  Respiratory: Negative for cough and shortness of breath.   Cardiovascular: Negative for chest pain, palpitations and leg swelling.  Gastrointestinal: Negative for abdominal pain, blood in stool, constipation, diarrhea, nausea and vomiting.  Genitourinary: Negative for dysuria.  Musculoskeletal: Negative for falls and myalgias.  Skin: Negative for rash.  Neurological: Negative for dizziness.  Psychiatric/Behavioral: Negative for depression. The patient is not nervous/anxious.       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 She has never used smokeless tobacco. She reports that she does not drink alcohol and does not use drugs.   Current Outpatient Medications:    amoxicillin (AMOXIL) 500 MG capsule, Take 500 mg by  mouth as needed. 4 pills when going to the dentist, Disp: , Rfl:    fexofenadine (ALLEGRA) 180 MG tablet, Take 180 mg by mouth daily. , Disp: , Rfl:    fluticasone (FLONASE) 50 MCG/ACT nasal spray, Place 2 sprays into both nostrils daily as needed for allergies. Reported on 10/28/2015, Disp: , Rfl:    gabapentin (NEURONTIN) 600 MG tablet, Take 600 mg by mouth 4 (four) times daily. , Disp: , Rfl:    hydrochlorothiazide (HYDRODIURIL) 25 MG tablet, TAKE 1 TABLET BY MOUTH EVERY DAY, Disp: 90 tablet, Rfl: 1   ibuprofen (ADVIL,MOTRIN) 200 MG  tablet, Take 200 mg by mouth every 6 (six) hours as needed. Two 200 mg tablets as needed for pain., Disp: , Rfl:    levothyroxine (SYNTHROID) 75 MCG tablet, TAKE 1 TABLET DAILY BEFORE BREAKFAST, Disp: 90 tablet, Rfl: 0   montelukast (SINGULAIR) 10 MG tablet, TAKE 1 TABLET AT BEDTIME, Disp: 90 tablet, Rfl: 1   Omega-3 Fatty Acids (FISH OIL) 1200 MG CAPS, Take 2 capsules by mouth 2 (two) times a day., Disp: , Rfl:    omeprazole (PRILOSEC) 20 MG capsule, Take 20 mg by mouth daily., Disp: , Rfl:    polyethylene glycol (MIRALAX / GLYCOLAX) packet, Take 17 g by mouth at bedtime. , Disp: , Rfl:    PREMARIN 0.625 MG tablet, TAKE 1 TABLET DAILY, Disp: 90 tablet, Rfl: 1   simvastatin (ZOCOR) 40 MG tablet, TAKE 1 TABLET (40MG ) BY MOUTH ONCE IN THE EVENING, Disp: 90 tablet, Rfl: 3   valsartan (DIOVAN) 160 MG tablet, TAKE 1 TABLET BY MOUTH EVERY DAY, Disp: 90 tablet, Rfl: 3   Observations/Objective: Blood pressure 128/62, pulse 78, temperature 97.6 F (36.4 C), temperature source Temporal, height 5' 5.5" (1.664 m), weight 235 lb 8 oz (106.8 kg), SpO2 98 %.  Physical Exam Constitutional:      General: She is not in acute distress.    Appearance: Normal appearance. She is well-developed. She is not ill-appearing or toxic-appearing.  HENT:     Head: Normocephalic.     Right Ear: Hearing, tympanic membrane, ear canal and external ear normal. Tympanic membrane is not erythematous, retracted or bulging.     Left Ear: Hearing, tympanic membrane, ear canal and external ear normal. Tympanic membrane is not erythematous, retracted or bulging.     Nose: No mucosal edema or rhinorrhea.     Right Sinus: No maxillary sinus tenderness or frontal sinus tenderness.     Left Sinus: No maxillary sinus tenderness or frontal sinus tenderness.     Mouth/Throat:     Pharynx: Uvula midline.  Eyes:     General: Lids are normal. Lids are everted, no foreign bodies appreciated.     Conjunctiva/sclera: Conjunctivae  normal.     Pupils: Pupils are equal, round, and reactive to light.  Neck:     Thyroid: No thyroid mass or thyromegaly.     Vascular: No carotid bruit.     Trachea: Trachea normal.  Cardiovascular:     Rate and Rhythm: Normal rate and regular rhythm.     Pulses: Normal pulses.     Heart sounds: Normal heart sounds, S1 normal and S2 normal. No murmur heard.  No friction rub. No gallop.   Pulmonary:     Effort: Pulmonary effort is normal. No tachypnea or respiratory distress.     Breath sounds: Normal breath sounds. No decreased breath sounds, wheezing, rhonchi or rales.  Abdominal:     General: Bowel sounds are  normal.     Palpations: Abdomen is soft.     Tenderness: There is no abdominal tenderness.  Musculoskeletal:     Cervical back: Normal range of motion and neck supple.  Skin:    General: Skin is warm and dry.     Findings: No rash.  Neurological:     Mental Status: She is alert.  Psychiatric:        Mood and Affect: Mood is not anxious or depressed.        Speech: Speech normal.        Behavior: Behavior normal. Behavior is cooperative.        Thought Content: Thought content normal.        Judgment: Judgment normal.      Assessment and Plan   Hypothyroidism Well controlled. Continue current medication.   Pure hypercholesterolemia Good control on statin.  Essential hypertension, benign Well controlled. Continue current medication.   Urge incontinence  Myrbetriq not effective and pts not interested in anticholinergic given SE profile.Not interested in urology referral.    Chronic low back pain Followed by Dr. Nelva Bush. USing ibuprofen prn.  Dark stools Eval with stool hemeoccult  Diffuse arthralgia  Negative initial eval for rheum disease. Pt concerned about inflammation suggested by CRP elevation ( although sed rate normal).  Will refer to rheumatologit for consideration of rheumatologic disease as cause of multiple joint pains.        Eliezer Lofts, MD

## 2019-12-15 NOTE — Assessment & Plan Note (Signed)
Followed by Dr. Nelva Bush. USing ibuprofen prn.

## 2019-12-15 NOTE — Assessment & Plan Note (Addendum)
Negative initial eval for rheum disease. Pt concerned about inflammation suggested by CRP elevation ( although sed rate normal).  Will refer to rheumatologit for consideration of rheumatologic disease as cause of multiple joint pains.

## 2019-12-15 NOTE — Assessment & Plan Note (Signed)
Eval with stool hemeoccult

## 2019-12-15 NOTE — Assessment & Plan Note (Signed)
Good control on statin. 

## 2019-12-15 NOTE — Assessment & Plan Note (Signed)
Well controlled. Continue current medication.  

## 2019-12-15 NOTE — Assessment & Plan Note (Addendum)
Myrbetriq not effective and pts not interested in anticholinergic given SE profile.Not interested in urology referral.

## 2019-12-15 NOTE — Patient Instructions (Addendum)
We will call to set up rheumatogy appointment.  Pick up stool test. Work on low carb diet, increase exercise as tolerated. Consider water exercise.   Look into medications for over active bladder.. such as detrol LA, Toviaz, vesicare, sanctura.

## 2019-12-16 ENCOUNTER — Telehealth: Payer: Self-pay

## 2019-12-16 NOTE — Telephone Encounter (Signed)
No, tube should be labeled

## 2019-12-16 NOTE — Telephone Encounter (Addendum)
Pt left v/m that she is to return a stool culture to [facility] and pt has completed the stool specimen but wants to know if she needs to write anything on outside of envelope. Pt request cb. I spoke with Terri in lab and pt does not have to write on outside of envelope. Pt voiced understanding and wants to know where to drop the stool specimen at. Per Tamera in lab advised can drop stool specimen in container in front foyer at main entrance and pt voiced understanding. Nothing further needed.

## 2019-12-18 ENCOUNTER — Other Ambulatory Visit (INDEPENDENT_AMBULATORY_CARE_PROVIDER_SITE_OTHER): Payer: Federal, State, Local not specified - PPO

## 2019-12-18 DIAGNOSIS — R195 Other fecal abnormalities: Secondary | ICD-10-CM

## 2019-12-18 LAB — FECAL OCCULT BLOOD, IMMUNOCHEMICAL: Fecal Occult Bld: NEGATIVE

## 2020-01-15 ENCOUNTER — Other Ambulatory Visit: Payer: Self-pay | Admitting: Family Medicine

## 2020-02-11 ENCOUNTER — Other Ambulatory Visit: Payer: Self-pay | Admitting: Otolaryngology

## 2020-02-11 DIAGNOSIS — D333 Benign neoplasm of cranial nerves: Secondary | ICD-10-CM

## 2020-02-22 ENCOUNTER — Other Ambulatory Visit: Payer: Self-pay | Admitting: Family Medicine

## 2020-03-17 ENCOUNTER — Other Ambulatory Visit: Payer: Self-pay | Admitting: Family Medicine

## 2020-04-25 ENCOUNTER — Other Ambulatory Visit: Payer: Self-pay | Admitting: Family Medicine

## 2020-04-25 DIAGNOSIS — Z1231 Encounter for screening mammogram for malignant neoplasm of breast: Secondary | ICD-10-CM

## 2020-05-04 ENCOUNTER — Other Ambulatory Visit: Payer: Self-pay | Admitting: Family Medicine

## 2020-05-31 ENCOUNTER — Encounter: Payer: Self-pay | Admitting: Family Medicine

## 2020-05-31 ENCOUNTER — Other Ambulatory Visit: Payer: Self-pay

## 2020-05-31 ENCOUNTER — Ambulatory Visit: Payer: Federal, State, Local not specified - PPO | Admitting: Family Medicine

## 2020-05-31 VITALS — BP 140/70 | HR 70 | Temp 98.0°F | Ht 65.5 in | Wt 236.0 lb

## 2020-05-31 DIAGNOSIS — R7303 Prediabetes: Secondary | ICD-10-CM

## 2020-05-31 DIAGNOSIS — R3121 Asymptomatic microscopic hematuria: Secondary | ICD-10-CM | POA: Diagnosis not present

## 2020-05-31 DIAGNOSIS — Z01818 Encounter for other preprocedural examination: Secondary | ICD-10-CM | POA: Diagnosis not present

## 2020-05-31 LAB — POC URINALSYSI DIPSTICK (AUTOMATED)
Bilirubin, UA: NEGATIVE
Glucose, UA: NEGATIVE
Ketones, UA: NEGATIVE
Leukocytes, UA: NEGATIVE
Nitrite, UA: NEGATIVE
Protein, UA: NEGATIVE
Spec Grav, UA: 1.02 (ref 1.010–1.025)
Urobilinogen, UA: 0.2 E.U./dL
pH, UA: 6 (ref 5.0–8.0)

## 2020-05-31 NOTE — Progress Notes (Signed)
Patient ID: Kristine [name], female    DOB: [birth year], 80 y.o.   MRN: [id number]  This visit was conducted in person.  BP 140/70   Pulse 70   Temp 98 F (36.7 C) (Temporal)   Ht 5' 5.5" (1.664 m)   Wt 236 lb (107 kg)   SpO2 99%   BMI 38.68 kg/m    CC: preop evaluation prior to right total hip arthroplasty on 07/13/20 Subjective:   HPI: Kristine [name] is a 80 y.o. female  with history of hypothyroidism, HTN, peripheral neuropathy, history of iron deficiency anemia, prediabetes and GERD presenting on 05/31/2020 for Pre-op Exam  She has upcoming THA scheduled on 07/13/20 with Dr. Maureen Ralphs   No recent MI, cardiac event or TIA etc. 2006  EKG nonspecific T.. Referred for stress test:  low risk Last EKG pre-op 2019 unremarkable  Body mass index is 38.68 kg/m.     She has had multiple past surgeries.Marland Kitchen the last being on lumbar decompression on 08/26/2017  without any surgical complications.   Orthopedic surgery: intermediate risk of cardiac death 1-5%  Low cardiac risk index except for age   She is able to do 5-6 mets of activity.. but limited due to joints.  HTN.. borderline high.. planning to follow at home.  BP Readings from Last 3 Encounters:  05/31/20 140/70  12/15/19 128/62  10/02/19 140/86   Rheumatologic issues, RA diagnosis and pseudogout: Hand pain is better on methotrexate but still issues.  Seeing rheumatology.    Relevant past medical, surgical, family and social history reviewed and updated as indicated. Interim medical history since our last visit reviewed. Allergies and medications reviewed and updated. Outpatient Medications Prior to Visit  Medication Sig Dispense Refill  . amoxicillin (AMOXIL) 500 MG capsule Take 500 mg by mouth as needed. 4 pills when going to the dentist    . colchicine 0.6 MG tablet Take 0.6 mg by mouth 2 (two) times daily as needed (for gout flare).    . fexofenadine (ALLEGRA) 180 MG tablet Take 180 mg by mouth daily.    . fluticasone  (FLONASE) 50 MCG/ACT nasal spray Place 2 sprays into both nostrils daily as needed for allergies. Reported on [id number]    . folic acid (FOLVITE) 1 MG tablet Take 1 mg by mouth daily.    Marland Kitchen gabapentin (NEURONTIN) 600 MG tablet Take 600 mg by mouth 4 (four) times daily.     . hydrochlorothiazide (HYDRODIURIL) 25 MG tablet TAKE 1 TABLET BY MOUTH EVERY DAY 90 tablet 1  . ibuprofen (ADVIL,MOTRIN) 200 MG tablet Take 200 mg by mouth every 6 (six) hours as needed. Two 200 mg tablets as needed for pain.    Marland Kitchen levothyroxine (SYNTHROID) 75 MCG tablet TAKE 1 TABLET DAILY BEFORE BREAKFAST 90 tablet 3  . methotrexate (RHEUMATREX) 2.5 MG tablet Take 10 mg by mouth once a week.    . montelukast (SINGULAIR) 10 MG tablet TAKE 1 TABLET AT BEDTIME 90 tablet 0  . Omega-3 Fatty Acids (FISH OIL) 1200 MG CAPS Take 2 capsules by mouth 2 (two) times a day.    Marland Kitchen omeprazole (PRILOSEC) 20 MG capsule Take 20 mg by mouth daily.    . polyethylene glycol (MIRALAX / GLYCOLAX) packet Take 17 g by mouth at bedtime.    Marland Kitchen PREMARIN 0.625 MG tablet TAKE 1 TABLET DAILY 90 tablet 0  . simvastatin (ZOCOR) 40 MG tablet TAKE 1 TABLET (40MG ) BY MOUTH ONCE IN THE EVENING 90 tablet 3  .  valsartan (DIOVAN) 160 MG tablet TAKE 1 TABLET BY MOUTH EVERY DAY 90 tablet 3   No facility-administered medications prior to visit.     Per HPI unless specifically indicated in ROS section below Review of Systems  Constitutional: Negative for fatigue and fever.  HENT: Negative for congestion.   Eyes: Negative for pain.  Respiratory: Negative for cough and shortness of breath.   Cardiovascular: Negative for chest pain, palpitations and leg swelling.  Gastrointestinal: Negative for abdominal pain.  Genitourinary: Negative for dysuria and vaginal bleeding.  Musculoskeletal: Negative for back pain.  Neurological: Negative for syncope, light-headedness and headaches.  Psychiatric/Behavioral: Negative for dysphoric mood.   Objective:  BP 140/70   Pulse 70    Temp 98 F (36.7 C) (Temporal)   Ht 5' 5.5" (1.664 m)   Wt 236 lb (107 kg)   SpO2 99%   BMI 38.68 kg/m   Wt Readings from Last 3 Encounters:  05/31/20 236 lb (107 kg)  12/15/19 235 lb 8 oz (106.8 kg)  10/02/19 239 lb 12 oz (108.7 kg)      Physical Exam Constitutional:      General: She is not in acute distress.Vital signs are normal.     Appearance: Normal appearance. She is well-developed and well-nourished. She is obese. She is not ill-appearing or toxic-appearing.  HENT:     Head: Normocephalic.     Right Ear: Hearing, tympanic membrane, ear canal and external ear normal. Tympanic membrane is not erythematous, retracted or bulging.     Left Ear: Hearing, tympanic membrane, ear canal and external ear normal. Tympanic membrane is not erythematous, retracted or bulging.     Nose: No mucosal edema or rhinorrhea.     Right Sinus: No maxillary sinus tenderness or frontal sinus tenderness.     Left Sinus: No maxillary sinus tenderness or frontal sinus tenderness.     Mouth/Throat:     Mouth: Oropharynx is clear and moist and mucous membranes are normal.     Pharynx: Uvula midline.  Eyes:     General: Lids are normal. Lids are everted, no foreign bodies appreciated.     Extraocular Movements: EOM normal.     Conjunctiva/sclera: Conjunctivae normal.     Pupils: Pupils are equal, round, and reactive to light.  Neck:     Thyroid: No thyroid mass or thyromegaly.     Vascular: No carotid bruit.     Trachea: Trachea normal.  Cardiovascular:     Rate and Rhythm: Normal rate and regular rhythm.     Pulses: Normal pulses and intact distal pulses.     Heart sounds: Normal heart sounds, S1 normal and S2 normal. No murmur heard. No friction rub. No gallop.   Pulmonary:     Effort: Pulmonary effort is normal. No tachypnea or respiratory distress.     Breath sounds: Normal breath sounds. No decreased breath sounds, wheezing, rhonchi or rales.  Abdominal:     General: Bowel sounds are  normal.     Palpations: Abdomen is soft.     Tenderness: There is no abdominal tenderness.  Musculoskeletal:     Cervical back: Normal range of motion and neck supple.  Skin:    General: Skin is warm, dry and intact.     Findings: No rash.  Neurological:     Mental Status: She is alert.  Psychiatric:        Mood and Affect: Mood is not anxious or depressed.        Speech:  Speech normal.        Behavior: Behavior normal. Behavior is cooperative.        Thought Content: Thought content normal.        Cognition and Memory: Cognition and memory normal.        Judgment: Judgment normal.       Results for orders placed or performed in visit on 12/18/19  Fecal occult blood, imunochemical   Specimen: Stool  Result Value Ref Range   Fecal Occult Bld Negative Negative    This visit occurred during the SARS-CoV-2 public health emergency.  Safety protocols were in place, including screening questions prior to the visit, additional usage of staff PPE, and extensive cleaning of exam room while observing appropriate contact time as indicated for disinfecting solutions.   COVID 19 screen:  No recent travel or known exposure to COVID19 The patient denies respiratory symptoms of COVID 19 at this time. The importance of social distancing was discussed today.   Assessment and Plan    Problem List Items Addressed This Visit    Asymptomatic microscopic hematuria    NEW, Incidentally noted on UA for preop screening.. has chronic increased urinary frequency and incontinence.. no recent change.   Send for culture. If negative consider recheck . If persistent.. refer to urology for further eval.      Relevant Orders   Urine Culture   Preoperative evaluation to rule out surgical contraindication - Primary    No recent MI, cardiac event or TIA etc. 2006  EKG nonspecific T.. Referred for stress test:  low risk Last EKG pre-op 2019 unremarkable  Body mass index is 38.68 kg/m.    She has had  multiple past surgeries.Marland Kitchen the last being on lumbar decompression on 08/26/2017  without any surgical complications.   Orthopedic surgery: intermediate risk of cardiac death 1-5%  Low cardiac risk index except for age   She is able to do 5-6 mets of activity.. but limited due to joints. No CP, No SOB.  EKG today: EKG: Normal sinus rhythm. Normal axis, normal R wave progression, No acute ST elevation or depression, no change from previous 2019.  Send for pre-op lab eval but if unremarkable expect low risk for surgical complications.       Relevant Orders   Hemoglobin A1c   CBC with Differential/Platelet   Comprehensive metabolic panel   Protime-INR   EKG 12-Lead (Completed)   POCT Urinalysis Dipstick (Automated) (Completed)         Kerby Nora, MD

## 2020-05-31 NOTE — Assessment & Plan Note (Addendum)
NEW, Incidentally noted on UA for preop screening.. has chronic increased urinary frequency and incontinence.. no recent change.   Send for culture. If negative consider recheck . If persistent.. refer to urology for further eval.

## 2020-05-31 NOTE — Assessment & Plan Note (Addendum)
No recent MI, cardiac event or TIA etc. 2006  EKG nonspecific T.. Referred for stress test:  low risk Last EKG pre-op 2019 unremarkable  Body mass index is 38.68 kg/m.    She has had multiple past surgeries.Marland Kitchen the last being on lumbar decompression on 08/26/2017  without any surgical complications.   Orthopedic surgery: intermediate risk of cardiac death 1-5%  Low cardiac risk index except for age   She is able to do 5-6 mets of activity.. but limited due to joints. No CP, No SOB.  EKG today: EKG: Normal sinus rhythm. Normal axis, normal R wave progression, No acute ST elevation or depression, no change from previous 2019.  Send for pre-op lab eval but if unremarkable expect low risk for surgical complications.

## 2020-05-31 NOTE — Patient Instructions (Signed)
Please stop at the lab to have labs drawn.  

## 2020-06-01 LAB — URINE CULTURE
MICRO NUMBER:: 11380226
SPECIMEN QUALITY:: ADEQUATE

## 2020-06-01 LAB — CBC WITH DIFFERENTIAL/PLATELET
Basophils Absolute: 0.1 10*3/uL (ref 0.0–0.1)
Basophils Relative: 1.4 % (ref 0.0–3.0)
Eosinophils Absolute: 0.2 10*3/uL (ref 0.0–0.7)
Eosinophils Relative: 2.7 % (ref 0.0–5.0)
HCT: 40.8 % (ref 36.0–46.0)
Hemoglobin: 13.9 g/dL (ref 12.0–15.0)
Lymphocytes Relative: 28.3 % (ref 12.0–46.0)
Lymphs Abs: 2.2 10*3/uL (ref 0.7–4.0)
MCHC: 34.1 g/dL (ref 30.0–36.0)
MCV: 86.2 fl (ref 78.0–100.0)
Monocytes Absolute: 0.4 10*3/uL (ref 0.1–1.0)
Monocytes Relative: 4.6 % (ref 3.0–12.0)
Neutro Abs: 4.9 10*3/uL (ref 1.4–7.7)
Neutrophils Relative %: 63 % (ref 43.0–77.0)
Platelets: 250 10*3/uL (ref 150.0–400.0)
RBC: 4.73 Mil/uL (ref 3.87–5.11)
RDW: 15.3 % (ref 11.5–15.5)
WBC: 7.8 10*3/uL (ref 4.0–10.5)

## 2020-06-01 LAB — COMPREHENSIVE METABOLIC PANEL
ALT: 13 U/L (ref 0–35)
AST: 14 U/L (ref 0–37)
Albumin: 4.5 g/dL (ref 3.5–5.2)
Alkaline Phosphatase: 53 U/L (ref 39–117)
BUN: 19 mg/dL (ref 6–23)
CO2: 28 mEq/L (ref 19–32)
Calcium: 9.4 mg/dL (ref 8.4–10.5)
Chloride: 103 mEq/L (ref 96–112)
Creatinine, Ser: 0.71 mg/dL (ref 0.40–1.20)
GFR: 80.32 mL/min (ref >60.00)
Glucose, Bld: 88 mg/dL (ref 70–99)
Potassium: 4.3 mEq/L (ref 3.5–5.1)
Sodium: 139 mEq/L (ref 135–145)
Total Bilirubin: 0.6 mg/dL (ref 0.2–1.2)
Total Protein: 6.8 g/dL (ref 6.0–8.3)

## 2020-06-01 LAB — HEMOGLOBIN A1C: Hgb A1c MFr Bld: 6 % (ref 4.6–6.5)

## 2020-06-01 LAB — PROTIME-INR
INR: 0.9 ratio (ref 0.8–1.0)
Prothrombin Time: 10.5 s (ref 9.6–13.1)

## 2020-06-02 ENCOUNTER — Telehealth: Payer: Self-pay

## 2020-06-02 ENCOUNTER — Other Ambulatory Visit: Payer: Self-pay | Admitting: Family Medicine

## 2020-06-02 MED ORDER — CEPHALEXIN 500 MG PO CAPS: 500.0000 mg | ORAL_CAPSULE | Freq: Three times a day (TID) | ORAL | 0 refills | Status: DC

## 2020-06-02 NOTE — Telephone Encounter (Signed)
Pt left v/m wanting some info; pt said [facility] has rx for abx for + urine culture. Pt wants to know when finishes the abx should she have another culture done to make sure UTI is taken care of.  Pt has CPX labs scheduled on 06/14/20 and pt wants to know if should keep lab appt since pt has had numerous lab testing for surgical preop. Pt also wants to know if preop exam with Dr Ermalene Searing would take the place of annual exam scheduled on 06/21/20. Per 05/31/20 preop note Dr Ermalene Searing had if culture neg consider reck and if persistent refer to urology. Pt request cb after reviewed by Dr Ermalene Searing.

## 2020-06-02 NOTE — Telephone Encounter (Signed)
Lupita Leash CMA said that she has already spoken with pt and does not need note done. Will remove from Dr Daphine Deutscher box per Lupita Leash CMA.

## 2020-06-06 ENCOUNTER — Telehealth: Payer: Self-pay | Admitting: Family Medicine

## 2020-06-06 DIAGNOSIS — R7303 Prediabetes: Secondary | ICD-10-CM

## 2020-06-06 DIAGNOSIS — E7800 Pure hypercholesterolemia, unspecified: Secondary | ICD-10-CM

## 2020-06-06 NOTE — Telephone Encounter (Signed)
-----   Message from Cloyd Stagers, RT sent at 05/30/2020  1:53 PM EST ----- Regarding: Lab Orders for Tuesday 1.18.2022 Please place lab orders for Tuesday 1.18.2022, office visit for physical on Tuesday 1.25.2022 Thank you, Dyke Maes RT(R)

## 2020-06-12 ENCOUNTER — Other Ambulatory Visit: Payer: Self-pay | Admitting: Family Medicine

## 2020-06-14 ENCOUNTER — Other Ambulatory Visit: Payer: Self-pay

## 2020-06-14 ENCOUNTER — Ambulatory Visit: Payer: Federal, State, Local not specified - PPO

## 2020-06-14 ENCOUNTER — Other Ambulatory Visit (INDEPENDENT_AMBULATORY_CARE_PROVIDER_SITE_OTHER): Payer: Federal, State, Local not specified - PPO

## 2020-06-14 DIAGNOSIS — E7800 Pure hypercholesterolemia, unspecified: Secondary | ICD-10-CM

## 2020-06-14 DIAGNOSIS — R3121 Asymptomatic microscopic hematuria: Secondary | ICD-10-CM

## 2020-06-14 LAB — POC URINALSYSI DIPSTICK (AUTOMATED)
Bilirubin, UA: NEGATIVE
Glucose, UA: NEGATIVE
Ketones, UA: NEGATIVE
Leukocytes, UA: NEGATIVE
Nitrite, UA: NEGATIVE
Protein, UA: NEGATIVE
Spec Grav, UA: 1.01 (ref 1.010–1.025)
Urobilinogen, UA: 0.2 E.U./dL
pH, UA: 7 (ref 5.0–8.0)

## 2020-06-14 LAB — LIPID PANEL
Cholesterol: 199 mg/dL (ref 0–200)
HDL: 60.6 mg/dL (ref >39.00)
NonHDL: 138.5
Total CHOL/HDL Ratio: 3
Triglycerides: 203 mg/dL — ABNORMAL HIGH (ref 0.0–149.0)
VLDL: 40.6 mg/dL — ABNORMAL HIGH (ref 0.0–40.0)

## 2020-06-14 LAB — LDL CHOLESTEROL, DIRECT: Direct LDL: 105 mg/dL

## 2020-06-14 NOTE — Addendum Note (Signed)
Addended by: [name] on: 06/14/2020 04:46 PM   Modules accepted: Orders

## 2020-06-14 NOTE — Telephone Encounter (Signed)
Pharmacy requests refill on: Simvastatin 40 mg  LAST REFILL: 06/19/2019 (Q-90, R-3) LAST OV: 05/31/2020 NEXT OV: 06/21/2020 PHARMACY: [id number] [place], [place]

## 2020-06-21 ENCOUNTER — Other Ambulatory Visit: Payer: Self-pay

## 2020-06-21 ENCOUNTER — Encounter: Payer: Self-pay | Admitting: Family Medicine

## 2020-06-21 ENCOUNTER — Ambulatory Visit (INDEPENDENT_AMBULATORY_CARE_PROVIDER_SITE_OTHER): Payer: Federal, State, Local not specified - PPO | Admitting: Family Medicine

## 2020-06-21 VITALS — BP 140/70 | HR 64 | Temp 97.7°F | Ht 65.5 in | Wt 237.2 lb

## 2020-06-21 DIAGNOSIS — E7800 Pure hypercholesterolemia, unspecified: Secondary | ICD-10-CM

## 2020-06-21 DIAGNOSIS — E039 Hypothyroidism, unspecified: Secondary | ICD-10-CM

## 2020-06-21 DIAGNOSIS — D509 Iron deficiency anemia, unspecified: Secondary | ICD-10-CM | POA: Diagnosis not present

## 2020-06-21 DIAGNOSIS — R7303 Prediabetes: Secondary | ICD-10-CM

## 2020-06-21 DIAGNOSIS — Z0000 Encounter for general adult medical examination without abnormal findings: Secondary | ICD-10-CM | POA: Diagnosis not present

## 2020-06-21 DIAGNOSIS — I10 Essential (primary) hypertension: Secondary | ICD-10-CM

## 2020-06-21 DIAGNOSIS — R3121 Asymptomatic microscopic hematuria: Secondary | ICD-10-CM

## 2020-06-21 DIAGNOSIS — G609 Hereditary and idiopathic neuropathy, unspecified: Secondary | ICD-10-CM

## 2020-06-23 MED ORDER — COLCHICINE 0.6 MG PO TABS: 0.6000 mg | ORAL_TABLET | Freq: Two times a day (BID) | ORAL | 1 refills | Status: DC | PRN

## 2020-06-23 NOTE — Assessment & Plan Note (Signed)
Stable, chronic.  Continue current medication.  HCTZ 25 mg daily  Valsartan 160 mg daily

## 2020-06-23 NOTE — Assessment & Plan Note (Signed)
Stable control. 

## 2020-06-23 NOTE — Assessment & Plan Note (Addendum)
Fairly Stable, slightly worse LDL control, chronic.  Continue current medication. Encouraged exercise, weight loss, healthy eating habits.   simvastatin 40 mg daily.

## 2020-06-23 NOTE — Assessment & Plan Note (Signed)
Stable, chronic.  Continue current medication.   Gabapentin 600 mg 4 times a day.

## 2020-06-23 NOTE — Assessment & Plan Note (Signed)
Resolved

## 2020-06-23 NOTE — Progress Notes (Signed)
Patient ID: Kristine [name], female    DOB: April 15, 1941, 80 y.o.   MRN: [id number]  This visit was conducted in person.  BP 140/70   Pulse 64   Temp 97.7 F (36.5 C) (Temporal)   Ht 5' 5.5" (1.664 m)   Wt 237 lb 4 oz (107.6 kg)   SpO2 98%   BMI 38.88 kg/m    CC:  Chief Complaint  Patient presents with  . Annual Exam    Subjective:   HPI: Kristine [name] is a 80 y.o. female presenting on 06/21/2020 for Annual Exam   She has upcoming schedule total hip arthroplasty on 07/13/2020.   She had  preop eval on 05/31/2020.  Hypothyroid  Lab Results  Component Value Date   TSH 4.66 (H) 12/08/2019    Elevated Cholesterol:  LDL almost  at goal on 40 mg simvastatin. Lab Results  Component Value Date   CHOL 199 06/14/2020   HDL 60.60 06/14/2020   LDLCALC 79 12/08/2019   LDLDIRECT 105.0 06/14/2020   TRIG 203.0 (H) 06/14/2020   CHOLHDL 3 06/14/2020  Using medications without problems:none Muscle aches: none Diet compliance: moderate Exercise: limited with hip and back, but improving. Other complaints:  Hypertension:   on HCTZ, diovan  BP Readings from Last 3 Encounters:  06/21/20 140/70  05/31/20 140/70  12/15/19 128/62  Using medication without problems or lightheadedness:  none Chest pain with exertion:none Edema:none Short of breath:none Average home BPs:  Other issues:prediabetes  Lab Results  Component Value Date   HGBA1C 6.0 05/31/2020    Rheumatologic issue, hand and shoulder pain: Now seeing rheumatologist.  Improved joint pain on methotrexate.    Relevant past medical, surgical, family and social history reviewed and updated as indicated. Interim medical history since our last visit reviewed. Allergies and medications reviewed and updated. Outpatient Medications Prior to Visit  Medication Sig Dispense Refill  . amoxicillin (AMOXIL) 500 MG capsule Take 500 mg by mouth as needed. 4 pills when going to the dentist    . colchicine 0.6 MG tablet Take 0.6 mg  by mouth 2 (two) times daily as needed (for gout flare).    . fexofenadine (ALLEGRA) 180 MG tablet Take 180 mg by mouth daily.    . fluticasone (FLONASE) 50 MCG/ACT nasal spray Place 2 sprays into both nostrils daily as needed for allergies. Reported on 11/30/1948    . folic acid (FOLVITE) 1 MG tablet Take 1 mg by mouth daily.    Marland Kitchen gabapentin (NEURONTIN) 600 MG tablet Take 600 mg by mouth 4 (four) times daily.     . hydrochlorothiazide (HYDRODIURIL) 25 MG tablet TAKE 1 TABLET BY MOUTH EVERY DAY 90 tablet 1  . ibuprofen (ADVIL,MOTRIN) 200 MG tablet Take 200 mg by mouth every 6 (six) hours as needed. Two 200 mg tablets as needed for pain.    Marland Kitchen levothyroxine (SYNTHROID) 75 MCG tablet TAKE 1 TABLET DAILY BEFORE BREAKFAST 90 tablet 3  . methotrexate (RHEUMATREX) 2.5 MG tablet Take 10 mg by mouth once a week.    . montelukast (SINGULAIR) 10 MG tablet TAKE 1 TABLET AT BEDTIME 90 tablet 0  . Omega-3 Fatty Acids (FISH OIL) 1200 MG CAPS Take 3 capsules by mouth 2 (two) times a day.    Marland Kitchen omeprazole (PRILOSEC) 20 MG capsule Take 20 mg by mouth daily.    . polyethylene glycol (MIRALAX / GLYCOLAX) packet Take 17 g by mouth daily as needed.    Marland Kitchen PREMARIN 0.625 MG tablet  TAKE 1 TABLET DAILY 90 tablet 0  . simvastatin (ZOCOR) 40 MG tablet TAKE 1 TABLET (40MG ) BY MOUTH ONCE IN THE EVENING 90 tablet 1  . valsartan (DIOVAN) 160 MG tablet TAKE 1 TABLET BY MOUTH EVERY DAY 90 tablet 3  . cephALEXin (KEFLEX) 500 MG capsule Take 1 capsule (500 mg total) by mouth 3 (three) times daily. 21 capsule 0   No facility-administered medications prior to visit.     Per HPI unless specifically indicated in ROS section below Review of Systems  Constitutional: Negative for fatigue and fever.  HENT: Negative for congestion.   Eyes: Negative for pain.  Respiratory: Negative for cough and shortness of breath.   Cardiovascular: Negative for chest pain, palpitations and leg swelling.  Gastrointestinal: Negative for abdominal pain.   Genitourinary: Negative for dysuria and vaginal bleeding.  Musculoskeletal: Negative for back pain.  Neurological: Negative for syncope, light-headedness and headaches.       Intermittent vertigo  Psychiatric/Behavioral: Negative for dysphoric mood.   Objective:  BP 140/70   Pulse 64   Temp 97.7 F (36.5 C) (Temporal)   Ht 5' 5.5" (1.664 m)   Wt 237 lb 4 oz (107.6 kg)   SpO2 98%   BMI 38.88 kg/m   Wt Readings from Last 3 Encounters:  06/21/20 237 lb 4 oz (107.6 kg)  05/31/20 236 lb (107 kg)  12/15/19 235 lb 8 oz (106.8 kg)      Physical Exam Constitutional:      General: She is not in acute distress.Vital signs are normal.     Appearance: Normal appearance. She is well-developed and well-nourished. She is not ill-appearing or toxic-appearing.  HENT:     Head: Normocephalic.     Right Ear: Hearing, tympanic membrane, ear canal and external ear normal.     Left Ear: Hearing, tympanic membrane, ear canal and external ear normal.     Nose: Nose normal.  Eyes:     General: Lids are normal. Lids are everted, no foreign bodies appreciated.     Extraocular Movements: EOM normal.     Conjunctiva/sclera: Conjunctivae normal.     Pupils: Pupils are equal, round, and reactive to light.  Neck:     Thyroid: No thyroid mass or thyromegaly.     Vascular: No carotid bruit.     Trachea: Trachea normal.  Cardiovascular:     Rate and Rhythm: Normal rate and regular rhythm.     Pulses: Intact distal pulses.     Heart sounds: Normal heart sounds, S1 normal and S2 normal. No murmur heard. No gallop.   Pulmonary:     Effort: Pulmonary effort is normal. No respiratory distress.     Breath sounds: Normal breath sounds. No wheezing, rhonchi or rales.  Abdominal:     General: Bowel sounds are normal. There is no distension or abdominal bruit.     Palpations: Abdomen is soft. There is no fluid wave, hepatosplenomegaly or mass.     Tenderness: There is no abdominal tenderness. There is no CVA  tenderness, guarding or rebound.     Hernia: No hernia is present.  Musculoskeletal:     Cervical back: Normal range of motion and neck supple.  Lymphadenopathy:     Cervical: No cervical adenopathy.     Upper Body:  No axillary adenopathy present. Skin:    General: Skin is warm, dry and intact.     Findings: No rash.  Neurological:     Mental Status: She is alert.  Cranial Nerves: No cranial nerve deficit.     Sensory: No sensory deficit.     Deep Tendon Reflexes: Strength normal.  Psychiatric:        Mood and Affect: Mood is not anxious or depressed.        Speech: Speech normal.        Behavior: Behavior normal. Behavior is cooperative.        Cognition and Memory: Cognition and memory normal.        Judgment: Judgment normal.       Results for orders placed or performed in visit on 06/14/20  Lipid panel  Result Value Ref Range   Cholesterol 199 0 - 200 mg/dL   Triglycerides 203.0 (H) 0.0 - 149.0 mg/dL   HDL 60.60 >39.00 mg/dL   VLDL 40.6 (H) 0.0 - 40.0 mg/dL   Total CHOL/HDL Ratio 3    NonHDL 138.50   LDL cholesterol, direct  Result Value Ref Range   Direct LDL 105.0 mg/dL  POCT Urinalysis Dipstick (Automated)  Result Value Ref Range   Color, UA Yellow    Clarity, UA Clear    Glucose, UA Negative Negative   Bilirubin, UA Negative    Ketones, UA Negative    Spec Grav, UA 1.010 1.010 - 1.025   Blood, UA Small (1+)    pH, UA 7.0 5.0 - 8.0   Protein, UA Negative Negative   Urobilinogen, UA 0.2 0.2 or 1.0 E.U./dL   Nitrite, UA Negative    Leukocytes, UA Negative Negative    This visit occurred during the SARS-CoV-2 public health emergency.  Safety protocols were in place, including screening questions prior to the visit, additional usage of staff PPE, and extensive cleaning of exam room while observing appropriate contact time as indicated for disinfecting solutions.   COVID 19 screen:  No recent travel or known exposure to COVID19 The patient denies  respiratory symptoms of COVID 19 at this time. The importance of social distancing was discussed today.   Assessment and Plan The patient's preventative maintenance and recommended screening tests for an annual wellness exam were reviewed in full today. Brought up to date unless services declined.  Counselled on the importance of diet, exercise, and its role in overall health and mortality. The patient's FH and SH was reviewed, including their home life, tobacco status, and drug and alcohol status.   Vaccines:uptodate  PNA, zoster. SE to flu in past refused Pap/DVE:Pap/DVE not indicated.  asymptomatic Mammo:06/04/2018, plans continuing yearly, scheduled 06/27/20 Bone Density:1/42018, nml..Plan repeat in 5 years Colon:04/2018 Dr. Silverio Decamp high grade tubular adenoma.. repeat in 3 years.. due 04/2021 Smoking Status:none    Problem List Items Addressed This Visit    Asymptomatic microscopic hematuria     Next 2/1 Tuesday afternoon... she will return to the office to repeat a urine sample to rechecck for hematuria.. prior to  Having to be referred to urology.      Essential hypertension, benign (Chronic)    Stable, chronic.  Continue current medication.  HCTZ 25 mg daily  Valsartan 160 mg daily       Hereditary and idiopathic peripheral neuropathy (Chronic)    Stable, chronic.  Continue current medication.   Gabapentin 600 mg 4 times a day.      Hypothyroidism (Chronic)    Stable, chronic.  Continue current medication.  Levothyroxine 75 mg daily         Iron deficiency anemia (Chronic)    Resolved.      Prediabetes (Chronic)  Stable control.       Pure hypercholesterolemia (Chronic)    Fairly Stable, slightly worse LDL control, chronic.  Continue current medication. Encouraged exercise, weight loss, healthy eating habits.   simvastatin 40 mg daily.          Other Visit Diagnoses    Routine general medical examination at a health care facility    -   Primary       Eliezer Lofts, MD

## 2020-06-23 NOTE — Assessment & Plan Note (Signed)
Stable, chronic.  Continue current medication.  Levothyroxine 75 mg daily

## 2020-06-23 NOTE — Assessment & Plan Note (Signed)
Next 2/1 Tuesday afternoon... she will return to the office to repeat a urine sample to rechecck for hematuria.. prior to  Having to be referred to urology.

## 2020-06-23 NOTE — Progress Notes (Signed)
Noted  

## 2020-06-27 ENCOUNTER — Ambulatory Visit
Admission: RE | Admit: 2020-06-27 | Discharge: 2020-06-27 | Disposition: A | Discharge status: Home / Self Care | Payer: Federal, State, Local not specified - PPO | Source: Ambulatory Visit | Attending: Family Medicine | Admitting: Family Medicine

## 2020-06-27 ENCOUNTER — Other Ambulatory Visit: Payer: Self-pay

## 2020-06-27 DIAGNOSIS — Z1231 Encounter for screening mammogram for malignant neoplasm of breast: Secondary | ICD-10-CM

## 2020-06-29 NOTE — Telephone Encounter (Signed)
Patient came into office and would like to discuss with Butch Penny the medication. Please advise.

## 2020-06-30 MED ORDER — SOLIFENACIN SUCCINATE 5 MG PO TABS: 5.0000 mg | ORAL_TABLET | Freq: Every day | ORAL | 11 refills | Status: DC

## 2020-07-01 NOTE — Progress Notes (Signed)
DUE TO COVID-19 ONLY ONE VISITOR IS ALLOWED TO COME WITH YOU AND STAY IN THE WAITING ROOM ONLY DURING PRE OP AND PROCEDURE DAY OF SURGERY. THE 1 VISITOR  MAY VISIT WITH YOU AFTER SURGERY IN YOUR PRIVATE ROOM DURING VISITING HOURS ONLY!  YOU NEED TO HAVE A COVID 19 TEST ON_2/04/2021 ______ @_______ , THIS TEST MUST BE DONE BEFORE SURGERY,  COVID TESTING SITE [address] [place] [id number], IT IS ON THE RIGHT GOING OUT [address] APPROXIMATELY  2 MINUTES PAST [employer] ON THE RIGHT. ONCE YOUR COVID TEST IS COMPLETED,  PLEASE BEGIN THE QUARANTINE INSTRUCTIONS AS OUTLINED IN YOUR HANDOUT.                RAZIA SCREWS  07/01/2020   Your procedure is scheduled on: 07/13/2020    Report to [facility] Main  Entrance   Report to admitting at    1050 AM     Call this number if you have problems the morning of surgery [phone]    REMEMBER: NO  SOLID FOOD CANDY OR GUM AFTER MIDNIGHT. CLEAR LIQUIDS UNTIL   1020 am       . NOTHING BY MOUTH EXCEPT CLEAR LIQUIDS UNTIL    . PLEASE FINISH ENSURE DRINK PER SURGEON ORDER  WHICH NEEDS TO BE COMPLETED AT  1020 am     .      CLEAR LIQUID DIET   Foods Allowed                                                                    Coffee and tea, regular and decaf                            Fruit ices (not with fruit pulp)                                      Iced Popsicles                                    Carbonated beverages, regular and diet                                    Cranberry, grape and apple juices Sports drinks like Gatorade Lightly seasoned clear broth or consume(fat free) Sugar, honey syrup ___________________________________________________________________      BRUSH YOUR TEETH MORNING OF SURGERY AND RINSE YOUR MOUTH OUT, NO CHEWING GUM CANDY OR MINTS.     Take these medicines the morning of surgery with A SIP OF WATER:  Allegra, gabapentin, synthroid, prilosec   DO NOT TAKE ANY DIABETIC  MEDICATIONS DAY OF YOUR SURGERY                               You may not have any metal on your body including hair pins and  piercings  Do not wear jewelry, make-up, lotions, powders or perfumes, deodorant             Do not wear nail polish on your fingernails.  Do not shave  48 hours prior to surgery.              Men may shave face and neck.   Do not bring valuables to the hospital. [place].  Contacts, dentures or bridgework may not be worn into surgery.  Leave suitcase in the car. After surgery it may be brought to your room.     Patients discharged the day of surgery will not be allowed to drive home. IF YOU ARE HAVING SURGERY AND GOING HOME THE SAME DAY, YOU MUST HAVE AN ADULT TO DRIVE YOU HOME AND BE WITH YOU FOR 24 HOURS. YOU MAY GO HOME BY TAXI OR UBER OR ORTHERWISE, BUT AN ADULT MUST ACCOMPANY YOU HOME AND STAY WITH YOU FOR 24 HOURS.  Name and phone number of your driver:  Special Instructions: N/A              Please read over the following fact sheets you were given: _____________________________________________________________________  [facility] - Preparing for Surgery Before surgery, you can play an important role.  Because skin is not sterile, your skin needs to be as free of germs as possible.  You can reduce the number of germs on your skin by washing with CHG (chlorahexidine gluconate) soap before surgery.  CHG is an antiseptic cleaner which kills germs and bonds with the skin to continue killing germs even after washing. Please DO NOT use if you have an allergy to CHG or antibacterial soaps.  If your skin becomes reddened/irritated stop using the CHG and inform your nurse when you arrive at Short Stay. Do not shave (including legs and underarms) for at least 48 hours prior to the first CHG shower.  You may shave your face/neck. Please follow these instructions carefully:  1.  Shower with CHG Soap the night  before surgery and the  morning of Surgery.  2.  If you choose to wash your hair, wash your hair first as usual with your  normal  shampoo.  3.  After you shampoo, rinse your hair and body thoroughly to remove the  shampoo.                           4.  Use CHG as you would any other liquid soap.  You can apply chg directly  to the skin and wash                       Gently with a scrungie or clean washcloth.  5.  Apply the CHG Soap to your body ONLY FROM THE NECK DOWN.   Do not use on face/ open                           Wound or open sores. Avoid contact with eyes, ears mouth and genitals (private parts).                       Wash face,  Genitals (private parts) with your normal soap.             6.  Wash thoroughly, paying special attention to the area where your surgery  will be performed.  7.  Thoroughly rinse your body with warm water from the neck down.  8.  DO NOT shower/wash with your normal soap after using and rinsing off  the CHG Soap.                9.  Pat yourself dry with a clean towel.            10.  Wear clean pajamas.            11.  Place clean sheets on your bed the night of your first shower and do not  sleep with pets. Day of Surgery : Do not apply any lotions/deodorants the morning of surgery.  Please wear clean clothes to the hospital/surgery center.  FAILURE TO FOLLOW THESE INSTRUCTIONS MAY RESULT IN THE CANCELLATION OF YOUR SURGERY PATIENT SIGNATURE_________________________________  NURSE SIGNATURE__________________________________  ________________________________________________________________________

## 2020-07-04 ENCOUNTER — Encounter (HOSPITAL_COMMUNITY): Payer: Self-pay

## 2020-07-04 ENCOUNTER — Other Ambulatory Visit: Payer: Self-pay

## 2020-07-04 ENCOUNTER — Encounter (HOSPITAL_COMMUNITY)
Admission: RE | Admit: 2020-07-04 | Discharge: 2020-07-04 | Disposition: A | Discharge status: Home / Self Care | Payer: Federal, State, Local not specified - PPO | Source: Ambulatory Visit | Attending: Orthopedic Surgery | Admitting: Orthopedic Surgery

## 2020-07-04 DIAGNOSIS — Z01812 Encounter for preprocedural laboratory examination: Secondary | ICD-10-CM | POA: Diagnosis present

## 2020-07-04 LAB — SURGICAL PCR SCREEN
MRSA, PCR: NEGATIVE
Staphylococcus aureus: NEGATIVE

## 2020-07-04 LAB — CBC
HCT: 39.9 % (ref 36.0–46.0)
Hemoglobin: 13.6 g/dL (ref 12.0–15.0)
MCH: 30.6 pg (ref 26.0–34.0)
MCHC: 34.1 g/dL (ref 30.0–36.0)
MCV: 89.9 fL (ref 80.0–100.0)
Platelets: 227 10*3/uL (ref 150–400)
RBC: 4.44 MIL/uL (ref 3.87–5.11)
RDW: 13.4 % (ref 11.5–15.5)
WBC: 6.7 10*3/uL (ref 4.0–10.5)
nRBC: 0 % (ref 0.0–0.2)

## 2020-07-04 LAB — COMPREHENSIVE METABOLIC PANEL
ALT: 17 U/L (ref 0–44)
AST: 15 U/L (ref 15–41)
Albumin: 4.1 g/dL (ref 3.5–5.0)
Alkaline Phosphatase: 54 U/L (ref 38–126)
Anion gap: 14 (ref 5–15)
BUN: 17 mg/dL (ref 8–23)
CO2: 23 mmol/L (ref 22–32)
Calcium: 9.1 mg/dL (ref 8.9–10.3)
Chloride: 104 mmol/L (ref 98–111)
Creatinine, Ser: 0.63 mg/dL (ref 0.44–1.00)
GFR, Estimated: >60 mL/min (ref >60)
Glucose, Bld: 146 mg/dL — ABNORMAL HIGH (ref 70–99)
Potassium: 3.9 mmol/L (ref 3.5–5.1)
Sodium: 141 mmol/L (ref 135–145)
Total Bilirubin: 0.8 mg/dL (ref 0.3–1.2)
Total Protein: 6.8 g/dL (ref 6.5–8.1)

## 2020-07-04 LAB — PROTIME-INR
INR: 1.1 (ref 0.8–1.2)
Prothrombin Time: 13.5 seconds (ref 11.4–15.2)

## 2020-07-04 LAB — APTT: aPTT: 26 seconds (ref 24–36)

## 2020-07-04 NOTE — Progress Notes (Addendum)
Anesthesia Review:  PCP: DR Eliezer Lofts  LOV 06/21/2020  Cardiologist : Chest x-ray : EKG :05/31/2020  Echo : Stress test: Cardiac Cath :  Activity level:  Can do a flight of stairs without difficulty  Sleep Study/ CPAP : no  Fasting Blood Sugar :      / Checks Blood Sugar -- times a day:   Blood Thinner/ Instructions /Last Dose: ASA / Instructions/ Last Dose :  05/31/20- HGBAq1c- 6.0 Type and screen to be recollected day of surgery due to antibodies.

## 2020-07-04 NOTE — H&P (Signed)
TOTAL HIP ADMISSION H&P  Patient is admitted for right total hip arthroplasty.  Subjective:  Chief Complaint: Right hip pain  HPI: Kristine [name], 80 y.o. female, has a history of pain and functional disability in the right hip due to arthritis and patient has failed non-surgical conservative treatments for greater than 12 weeks to include corticosteriod injections and activity modification. Onset of symptoms was gradual, starting several\ years ago with gradually worsening course since that time. The patient noted no past surgery on the right hip. Patient currently rates pain in the right hip at 8 out of 10 with activity. Patient has worsening of pain with activity and weight bearing, trendelenberg gait, pain with passive range of motion and crepitus. Patient has evidence of bone-on-bone arthritis throughout the right hip by imaging studies. This condition presents safety issues increasing the risk of falls. There is no current active infection.  Patient Active Problem List   Diagnosis Date Noted  . Asymptomatic microscopic hematuria 05/31/2020  . Diffuse arthralgia 12/15/2019  . Dark stools 12/15/2019  . Adhesive capsulitis of both shoulders 10/02/2019  . Chronic pain of both shoulders 10/02/2019  . Gastroesophageal reflux disease 06/12/2019  . Acute pain of right wrist 04/30/2018  . Toe pain, chronic, right 04/30/2018  . Status post lumbar spine surgery for decompression of spinal cord 08/28/2017  . Preoperative evaluation to rule out surgical contraindication 08/07/2017  . 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 History of lacunar cerebrovascular accident 03/27/2017  . BPPV (benign paroxysmal positional vertigo) 06/05/2016  . Urge incontinence 05/15/2016  . Prediabetes 05/12/2015  . Chronic low back pain 11/16/2011  . 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 Pure hypercholesterolemia 03/17/2009  . Chronic constipation 01/26/2009  . Hereditary and idiopathic  peripheral neuropathy 11/12/2008  . Essential hypertension, benign 03/05/2007  . Hypothyroidism 02/21/2007  . OBESITY NOS 02/12/2007  . Iron deficiency anemia 02/05/2007  . COMMON MIGRAINE 02/05/2007  . ALLERGIC RHINITIS 02/05/2007  . OSTEOARTHRITIS 02/05/2007    Past Medical History:  Diagnosis Date  . Allergy   . Anal fissure   . Anemia   . Arthritis    hands, back, hips  . Back pain    L4 nerve root compression  . Benign tumor    left ear  . Cataract   . GERD (gastroesophageal reflux disease)   . High cholesterol   . Hypertension   . Hypothyroidism   . Neuropathy   . 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 Sinusitis    chronic  . Thyroid disease   . Vertigo    HX of, last episode over 18 months ago    Past Surgical History:  Procedure Laterality Date  . ABDOMINAL HYSTERECTOMY  1996  . BACK SURGERY  2019   L4-5   . BREAST BIOPSY    . BREAST EXCISIONAL BIOPSY Left 1980   benign  . CATARACT EXTRACTION W/PHACO Right 09/08/2019   Procedure: CATARACT EXTRACTION PHACO AND INTRAOCULAR LENS PLACEMENT (IOC) RIGHT VIVITY LENS 8.89  01:14.2;  Surgeon: Birder Robson, MD;  Location: [facility];  Service: Ophthalmology;  Laterality: Right;  prefers later morning  . CATARACT EXTRACTION W/PHACO Left 09/29/2019   Procedure: CATARACT EXTRACTION PHACO AND INTRAOCULAR LENS PLACEMENT (IOC) LEFT VIVITY 6.45  00:41.0;  Surgeon: Birder Robson, MD;  Location: [facility];  Service: Ophthalmology;  Laterality: Left;  . COLONOSCOPY    . DILATION AND CURETTAGE OF UTERUS    . HEMORROIDECTOMY  2006, 2009  .  HIP SURGERY  2011   L hip replacement  . JOINT REPLACEMENT Left 2011   Total hip  . LIPOMA EXCISION    . LUMBAR LAMINECTOMY/DECOMPRESSION MICRODISCECTOMY N/A 08/28/2017   Procedure: Decompression L3-5, Excision of synovial cyst L4-5;  Surgeon: Melina Schools, MD;  Location: [facility];  Service: Orthopedics;  Laterality: N/A;  3.5 hrs  . RECTAL POLYPECTOMY  2010  .  RECTOCELE REPAIR  2009  . TOTAL ABDOMINAL HYSTERECTOMY W/ BILATERAL SALPINGOOPHORECTOMY    . TUBAL LIGATION      Prior to Admission medications   Medication Sig Start Date End Date Taking? Authorizing Provider  colchicine 0.6 MG tablet Take 1 tablet (0.6 mg total) by mouth 2 (two) times daily as needed (for gout flare). 06/23/20  Yes Bedsole, Amy E, MD  fexofenadine (ALLEGRA) 180 MG tablet Take 180 mg by mouth daily.   Yes [provider]  fluticasone (FLONASE) 50 MCG/ACT nasal spray Place 2 sprays into both nostrils daily as needed (congestion/allergies.). Reported on 10/28/2015   Yes [provider]  folic acid (FOLVITE) 1 MG tablet Take 1 mg by mouth daily. 02/24/20  Yes [provider]  gabapentin (NEURONTIN) 600 MG tablet Take 600 mg by mouth 4 (four) times daily.  09/23/18  Yes [provider]  hydrochlorothiazide (HYDRODIURIL) 25 MG tablet TAKE 1 TABLET BY MOUTH EVERY DAY Patient taking differently: Take 25 mg by mouth daily. 01/15/20  Yes Bedsole, Amy E, MD  ibuprofen (ADVIL,MOTRIN) 200 MG tablet Take 200-800 mg by mouth in the morning, at noon, in the evening, and at bedtime.   Yes [provider]  levothyroxine (SYNTHROID) 75 MCG tablet TAKE 1 TABLET DAILY BEFORE BREAKFAST Patient taking differently: Take 75 mcg by mouth daily before breakfast. TAKE 1 TABLET DAILY BEFORE BREAKFAST 02/23/20  Yes Bedsole, Amy E, MD  Lidocaine HCl (ASPERCREME LIDOCAINE) 4 % CREA Apply 1 application topically 3 (three) times daily as needed (pain.).   Yes [provider]  methotrexate (RHEUMATREX) 2.5 MG tablet Take 10 mg by mouth every Thursday. 05/20/20  Yes [provider]  montelukast (SINGULAIR) 10 MG tablet TAKE 1 TABLET AT BEDTIME Patient taking differently: Take 10 mg by mouth at bedtime. 05/05/20  Yes Bedsole, Amy E, MD  Neomy-Bacit-Polymyx-Pramoxine (TRIPLE ANTIBIOTIC PAIN RELIEF) 1 % OINT Apply 1 application topically as needed (wound care).    Yes [provider]  Neomycin-Bacitracin-Polymyxin (TRIPLE ANTIBIOTIC EX) Apply 1 application topically daily as needed (wound care).   Yes [provider]  Omega-3 Fatty Acids (FISH OIL) 1200 MG CAPS Take 3,600 mg by mouth 2 (two) times a day.   Yes [provider]  omeprazole (PRILOSEC) 20 MG capsule Take 20 mg by mouth daily.   Yes [provider]  polyethylene glycol (MIRALAX / GLYCOLAX) packet Take 17 g by mouth daily as needed (constipation.).   Yes [provider]  PREMARIN 0.625 MG tablet TAKE 1 TABLET DAILY Patient taking differently: Take 0.625 mg by mouth every evening. 05/05/20  Yes Bedsole, Amy E, MD  simvastatin (ZOCOR) 40 MG tablet TAKE 1 TABLET (40MG) BY MOUTH ONCE IN THE EVENING Patient taking differently: Take 40 mg by mouth every evening. 06/14/20  Yes Bedsole, Amy E, MD  trolamine salicylate (ASPERCREME) 10 % cream Apply 1 application topically at bedtime. Applied to feet at night   Yes [provider]  valsartan (DIOVAN) 160 MG tablet TAKE 1 TABLET BY MOUTH EVERY DAY Patient taking differently: Take 160 mg by mouth daily.  08/03/19  Yes Bedsole, Amy E, MD  amoxicillin (AMOXIL) 500 MG capsule Take 500 mg by mouth See admin instructions. Take 4 capsules (2000 mg) by mouth 1 hour prior to dental procedures    [provider]    Allergies  Allergen Reactions  . Corn-Containing Products Other (See Comments)    Stomach ache & diarrhea  . Influenza Vaccines     flushing  . Meperidine Hcl Other (See Comments)    (Demerol) Flushing    Social History   Socioeconomic History  . Marital status: Married    Spouse name: Not on file  . Number of children: Not on file  . Years of education: Not on file  . Highest education level: Not on file  Occupational History  . Not on file  Tobacco Use  . Smoking status: Never Smoker  . Smokeless tobacco: Never Used  Vaping Use  . Vaping Use: Never used  Substance and Sexual  Activity  . Alcohol use: No  . Drug use: No  . Sexual activity: Not on file  Other Topics Concern  . Not on file  Social History Narrative  . Not on file   Social Determinants of Health   Financial Resource Strain: Not on file  Food Insecurity: Not on file  Transportation Needs: Not on file  Physical Activity: Not on file  Stress: Not on file  Social Connections: Not on file  Intimate Partner Violence: Not on file    Tobacco Use: Low Risk   . Smoking Tobacco Use: Never Smoker  . Smokeless Tobacco Use: Never Used   Social History   Substance and Sexual Activity  Alcohol Use No    Family History  Problem Relation Age of Onset  . Hyperlipidemia Mother   . Heart disease Mother        heart attack  . Kidney disease Father   . Cancer Sister        multiple myeloma  . Diabetes Brother   . Colon cancer Neg Hx   . Esophageal cancer Neg Hx   . Rectal cancer Neg Hx   . Stomach cancer Neg Hx   . Breast cancer Neg Hx     Review of Systems  Constitutional: Negative for chills and fever.  HENT: Negative for congestion, sore throat and tinnitus.   Eyes: Negative for double vision, photophobia and pain.  Respiratory: Negative for cough, shortness of breath and wheezing.   Cardiovascular: Negative for chest pain, palpitations and orthopnea.  Gastrointestinal: Negative for heartburn, nausea and vomiting.  Genitourinary: Negative for dysuria, frequency and urgency.  Musculoskeletal: Positive for joint pain.  Neurological: Negative for dizziness, weakness and headaches.     Objective:  Physical Exam: Well nourished and well developed.  General: Alert and oriented x3, cooperative and pleasant, no acute distress.  Head: normocephalic, atraumatic, neck supple.  Eyes: EOMI.  Respiratory: breath sounds clear in all fields, no wheezing, rales, or rhonchi. Cardiovascular: Regular rate and rhythm, no murmurs, gallops or rubs.  Abdomen: non-tender to palpation and soft,  normoactive bowel sounds. Musculoskeletal:  Right Hip Exam:  The range of motion: Flexion to 100 degrees, Internal Rotation to 0 degrees, External Rotation to 30 degrees, and abduction to 30 degrees without discomfort.  There is no tenderness over the greater trochanteric bursa.  Calves soft and nontender. Motor function intact in LE. Strength 5/5 LE bilaterally. Neuro: Distal pulses 2+. Sensation to light touch intact in LE.  Imaging Review Plain radiographs demonstrate severe degenerative  joint disease of the right hip. The bone quality appears to be adequate for age and reported activity level.  Assessment/Plan:  End stage arthritis, right hip  The patient history, physical examination, clinical judgement of the provider and imaging studies are consistent with end stage degenerative joint disease of the right hip and total hip arthroplasty is deemed medically necessary. The treatment options including medical management, injection therapy, arthroscopy and arthroplasty were discussed at length. The risks and benefits of total hip arthroplasty were presented and reviewed. The risks due to aseptic loosening, infection, stiffness, dislocation/subluxation, thromboembolic complications and other imponderables were discussed. The patient acknowledged the explanation, agreed to proceed with the plan and consent was signed. Patient is being admitted for inpatient treatment for surgery, pain control, PT, OT, prophylactic antibiotics, VTE prophylaxis, progressive ambulation and ADLs and discharge planning.The patient is planning to be discharged home.   Patient's anticipated LOS is less than 2 midnights, meeting these requirements: - Younger than 20 - Lives within 1 hour of care - Has a competent adult at home to recover with post-op recover - NO history of  - Chronic pain requiring opiods  - Diabetes  - Coronary Artery Disease  - Heart failure  - Heart attack  - Stroke  - DVT/VTE  - Cardiac  arrhythmia  - Respiratory Failure/COPD  - Renal failure  - Anemia  - Advanced Liver disease  Therapy Plans: HEP Disposition: Home with husband Planned DVT Prophylaxis: Aspirin 328m DME Needed: None PCP: AEliezer Lofts MD (clearance in EPIC) TXA: IV Allergies: Demerol Anesthesia Concerns: None BMI: 38.8 Last HgbA1c: 6.0% (01/22) [facility] Other: Interested in same day discharge.   - Patient was instructed on what medications to stop prior to surgery. - Follow-up visit in 2 weeks with Dr. AWynelle Link- Begin physical therapy following surgery - Pre-operative lab work as pre-surgical testing - Prescriptions will be provided in hospital at time of discharge  KTheresa Duty PA-C Orthopedic Surgery [facility] [place]

## 2020-07-05 ENCOUNTER — Other Ambulatory Visit (INDEPENDENT_AMBULATORY_CARE_PROVIDER_SITE_OTHER): Payer: Federal, State, Local not specified - PPO

## 2020-07-05 DIAGNOSIS — R3121 Asymptomatic microscopic hematuria: Secondary | ICD-10-CM | POA: Diagnosis not present

## 2020-07-05 LAB — POC URINALSYSI DIPSTICK (AUTOMATED)
Bilirubin, UA: NEGATIVE
Blood, UA: NEGATIVE
Glucose, UA: NEGATIVE
Ketones, UA: NEGATIVE
Leukocytes, UA: NEGATIVE
Nitrite, UA: NEGATIVE
Protein, UA: NEGATIVE
Spec Grav, UA: 1.025 (ref 1.010–1.025)
Urobilinogen, UA: 0.2 E.U./dL
pH, UA: 5.5 (ref 5.0–8.0)

## 2020-07-09 ENCOUNTER — Other Ambulatory Visit (HOSPITAL_COMMUNITY)
Admission: RE | Admit: 2020-07-09 | Discharge: 2020-07-09 | Disposition: A | Discharge status: Home / Self Care | Payer: Federal, State, Local not specified - PPO | Source: Ambulatory Visit | Attending: Orthopedic Surgery | Admitting: Orthopedic Surgery

## 2020-07-09 DIAGNOSIS — Z20822 Contact with and (suspected) exposure to covid-19: Secondary | ICD-10-CM | POA: Diagnosis not present

## 2020-07-09 DIAGNOSIS — Z01812 Encounter for preprocedural laboratory examination: Secondary | ICD-10-CM | POA: Diagnosis not present

## 2020-07-09 LAB — SARS CORONAVIRUS 2 (TAT 6-24 HRS): SARS Coronavirus 2: NEGATIVE

## 2020-07-13 ENCOUNTER — Encounter (HOSPITAL_COMMUNITY): Payer: Self-pay | Admitting: Orthopedic Surgery

## 2020-07-13 ENCOUNTER — Ambulatory Visit (HOSPITAL_COMMUNITY): Payer: Federal, State, Local not specified - PPO | Admitting: Registered Nurse

## 2020-07-13 ENCOUNTER — Observation Stay (HOSPITAL_COMMUNITY)
Admission: RE | Admit: 2020-07-13 | Discharge: 2020-07-14 | Disposition: A | Discharge status: Home / Self Care | Payer: Federal, State, Local not specified - PPO | Attending: Orthopedic Surgery | Admitting: Orthopedic Surgery

## 2020-07-13 ENCOUNTER — Ambulatory Visit (HOSPITAL_COMMUNITY): Payer: Federal, State, Local not specified - PPO

## 2020-07-13 ENCOUNTER — Observation Stay (HOSPITAL_COMMUNITY): Payer: Federal, State, Local not specified - PPO

## 2020-07-13 ENCOUNTER — Encounter (HOSPITAL_COMMUNITY)
Admission: RE | Disposition: A | Discharge status: Home / Self Care | Payer: Self-pay | Source: Home / Self Care | Attending: Orthopedic Surgery

## 2020-07-13 ENCOUNTER — Ambulatory Visit (HOSPITAL_COMMUNITY): Payer: Federal, State, Local not specified - PPO | Admitting: Physician Assistant

## 2020-07-13 ENCOUNTER — Other Ambulatory Visit: Payer: Self-pay

## 2020-07-13 DIAGNOSIS — I10 Essential (primary) hypertension: Secondary | ICD-10-CM | POA: Insufficient documentation

## 2020-07-13 DIAGNOSIS — M1611 Unilateral primary osteoarthritis, right hip: Secondary | ICD-10-CM | POA: Diagnosis not present

## 2020-07-13 DIAGNOSIS — Z419 Encounter for procedure for purposes other than remedying health state, unspecified: Secondary | ICD-10-CM

## 2020-07-13 DIAGNOSIS — E039 Hypothyroidism, unspecified: Secondary | ICD-10-CM | POA: Diagnosis not present

## 2020-07-13 DIAGNOSIS — Z96642 Presence of left artificial hip joint: Secondary | ICD-10-CM | POA: Insufficient documentation

## 2020-07-13 DIAGNOSIS — M169 Osteoarthritis of hip, unspecified: Secondary | ICD-10-CM | POA: Diagnosis present

## 2020-07-13 DIAGNOSIS — Z79899 Other long term (current) drug therapy: Secondary | ICD-10-CM | POA: Insufficient documentation

## 2020-07-13 DIAGNOSIS — Z96649 Presence of unspecified artificial hip joint: Secondary | ICD-10-CM

## 2020-07-13 DIAGNOSIS — M25551 Pain in right hip: Secondary | ICD-10-CM | POA: Diagnosis present

## 2020-07-13 HISTORY — PX: TOTAL HIP ARTHROPLASTY: SHX124

## 2020-07-13 LAB — BPAM RBC
Blood Product Expiration Date: 202202252359
Blood Product Expiration Date: 202202252359
Unit Type and Rh: 600
Unit Type and Rh: 600

## 2020-07-13 LAB — TYPE AND SCREEN
ABO/RH(D): A NEG
Antibody Screen: NEGATIVE
Unit division: 0
Unit division: 0

## 2020-07-13 SURGERY — ARTHROPLASTY, HIP, TOTAL, ANTERIOR APPROACH
Anesthesia: Spinal | Site: Hip | Laterality: Right

## 2020-07-13 MED ORDER — FLUTICASONE PROPIONATE 50 MCG/ACT NA SUSP
2.0000 | Freq: Every day | NASAL | Status: DC | PRN
  Filled 2020-07-13: qty 16

## 2020-07-13 MED ORDER — SUGAMMADEX SODIUM 500 MG/5ML IV SOLN
INTRAVENOUS | Status: AC
  Filled 2020-07-13: qty 5

## 2020-07-13 MED ORDER — PROPOFOL 1000 MG/100ML IV EMUL
INTRAVENOUS | Status: AC
  Filled 2020-07-13: qty 100

## 2020-07-13 MED ORDER — HYDROCHLOROTHIAZIDE 25 MG PO TABS
25.0000 mg | ORAL_TABLET | Freq: Every day | ORAL | Status: DC
  Administered 2020-07-14: 25 mg via ORAL
  Filled 2020-07-13: qty 1

## 2020-07-13 MED ORDER — LACTATED RINGERS IV SOLN: INTRAVENOUS | Status: DC

## 2020-07-13 MED ORDER — DOCUSATE SODIUM 100 MG PO CAPS
100.0000 mg | ORAL_CAPSULE | Freq: Two times a day (BID) | ORAL | Status: DC
  Administered 2020-07-13 – 2020-07-14 (×2): 100 mg via ORAL
  Filled 2020-07-13 (×2): qty 1

## 2020-07-13 MED ORDER — DARIFENACIN HYDROBROMIDE ER 7.5 MG PO TB24
7.5000 mg | ORAL_TABLET | Freq: Every day | ORAL | Status: DC
  Administered 2020-07-14: 7.5 mg via ORAL
  Filled 2020-07-13: qty 1

## 2020-07-13 MED ORDER — DEXAMETHASONE SODIUM PHOSPHATE 10 MG/ML IJ SOLN
8.0000 mg | Freq: Once | INTRAMUSCULAR | Status: AC
  Administered 2020-07-13: 8 mg via INTRAVENOUS

## 2020-07-13 MED ORDER — BISACODYL 10 MG RE SUPP
10.0000 mg | Freq: Every day | RECTAL | Status: DC | PRN
Start: 2020-07-13 — End: 2020-07-14

## 2020-07-13 MED ORDER — LIDOCAINE HCL (PF) 2 % IJ SOLN
INTRAMUSCULAR | Status: AC
  Filled 2020-07-13: qty 5

## 2020-07-13 MED ORDER — SIMVASTATIN 40 MG PO TABS: 40.0000 mg | ORAL_TABLET | Freq: Every evening | ORAL | Status: DC

## 2020-07-13 MED ORDER — HYDROCODONE-ACETAMINOPHEN 7.5-325 MG PO TABS
1.0000 | ORAL_TABLET | ORAL | Status: DC | PRN
Start: 2020-07-13 — End: 2020-07-14
  Administered 2020-07-13: 2 via ORAL
  Filled 2020-07-13: qty 2

## 2020-07-13 MED ORDER — PHENYLEPHRINE 40 MCG/ML (10ML) SYRINGE FOR IV PUSH (FOR BLOOD PRESSURE SUPPORT)
PREFILLED_SYRINGE | INTRAVENOUS | Status: AC
  Filled 2020-07-13: qty 10

## 2020-07-13 MED ORDER — FENTANYL CITRATE (PF) 100 MCG/2ML IJ SOLN
INTRAMUSCULAR | Status: AC
  Administered 2020-07-13: 50 ug via INTRAVENOUS
  Filled 2020-07-13: qty 2

## 2020-07-13 MED ORDER — IRBESARTAN 150 MG PO TABS
150.0000 mg | ORAL_TABLET | Freq: Every day | ORAL | Status: DC
  Administered 2020-07-14: 150 mg via ORAL
  Filled 2020-07-13: qty 1

## 2020-07-13 MED ORDER — MONTELUKAST SODIUM 10 MG PO TABS
10.0000 mg | ORAL_TABLET | Freq: Every day | ORAL | Status: DC
  Administered 2020-07-13: 10 mg via ORAL
  Filled 2020-07-13: qty 1

## 2020-07-13 MED ORDER — PHENYLEPHRINE HCL-NACL 10-0.9 MG/250ML-% IV SOLN
INTRAVENOUS | Status: DC | PRN
  Administered 2020-07-13: 30 ug/min via INTRAVENOUS

## 2020-07-13 MED ORDER — PHENYLEPHRINE 40 MCG/ML (10ML) SYRINGE FOR IV PUSH (FOR BLOOD PRESSURE SUPPORT)
PREFILLED_SYRINGE | INTRAVENOUS | Status: DC | PRN
  Administered 2020-07-13: 80 ug via INTRAVENOUS

## 2020-07-13 MED ORDER — WATER FOR IRRIGATION, STERILE IR SOLN
Status: DC | PRN
  Administered 2020-07-13: 2000 mL

## 2020-07-13 MED ORDER — ORAL CARE MOUTH RINSE: 15.0000 mL | Freq: Once | OROMUCOSAL | Status: AC

## 2020-07-13 MED ORDER — MIDAZOLAM HCL 2 MG/2ML IJ SOLN
INTRAMUSCULAR | Status: AC
  Filled 2020-07-13: qty 2

## 2020-07-13 MED ORDER — ONDANSETRON HCL 4 MG PO TABS: 4.0000 mg | ORAL_TABLET | Freq: Four times a day (QID) | ORAL | Status: DC | PRN

## 2020-07-13 MED ORDER — POVIDONE-IODINE 10 % EX SWAB
2.0000 "application " | Freq: Once | CUTANEOUS | Status: AC
  Administered 2020-07-13: 2 via TOPICAL

## 2020-07-13 MED ORDER — PROPOFOL 10 MG/ML IV BOLUS
INTRAVENOUS | Status: DC | PRN
  Administered 2020-07-13: 20 mg via INTRAVENOUS

## 2020-07-13 MED ORDER — HYDROCODONE-ACETAMINOPHEN 5-325 MG PO TABS
1.0000 | ORAL_TABLET | ORAL | Status: DC | PRN
  Administered 2020-07-13: 2 via ORAL
  Administered 2020-07-14: 1 via ORAL
  Filled 2020-07-13: qty 1

## 2020-07-13 MED ORDER — PHENOL 1.4 % MT LIQD: 1.0000 | OROMUCOSAL | Status: DC | PRN

## 2020-07-13 MED ORDER — HYDROCODONE-ACETAMINOPHEN 5-325 MG PO TABS
ORAL_TABLET | ORAL | Status: AC
  Filled 2020-07-13: qty 2

## 2020-07-13 MED ORDER — ROCURONIUM BROMIDE 10 MG/ML (PF) SYRINGE
PREFILLED_SYRINGE | INTRAVENOUS | Status: AC
  Filled 2020-07-13: qty 10

## 2020-07-13 MED ORDER — PHENYLEPHRINE HCL-NACL 10-0.9 MG/250ML-% IV SOLN
INTRAVENOUS | Status: AC
  Filled 2020-07-13: qty 250

## 2020-07-13 MED ORDER — SUCCINYLCHOLINE CHLORIDE 200 MG/10ML IV SOSY
PREFILLED_SYRINGE | INTRAVENOUS | Status: AC
  Filled 2020-07-13: qty 10

## 2020-07-13 MED ORDER — EPHEDRINE 5 MG/ML INJ
INTRAVENOUS | Status: AC
  Filled 2020-07-13: qty 10

## 2020-07-13 MED ORDER — ONDANSETRON HCL 4 MG/2ML IJ SOLN
INTRAMUSCULAR | Status: AC
  Filled 2020-07-13: qty 4

## 2020-07-13 MED ORDER — METOCLOPRAMIDE HCL 5 MG PO TABS: 5.0000 mg | ORAL_TABLET | Freq: Three times a day (TID) | ORAL | Status: DC | PRN

## 2020-07-13 MED ORDER — 0.9 % SODIUM CHLORIDE (POUR BTL) OPTIME
TOPICAL | Status: DC | PRN
  Administered 2020-07-13: 1000 mL

## 2020-07-13 MED ORDER — ALBUMIN HUMAN 5 % IV SOLN
INTRAVENOUS | Status: AC
  Filled 2020-07-13: qty 500

## 2020-07-13 MED ORDER — ACETAMINOPHEN 325 MG PO TABS: 325.0000 mg | ORAL_TABLET | Freq: Four times a day (QID) | ORAL | Status: DC | PRN

## 2020-07-13 MED ORDER — BUPIVACAINE HCL (PF) 0.25 % IJ SOLN
INTRAMUSCULAR | Status: AC
  Filled 2020-07-13: qty 30

## 2020-07-13 MED ORDER — FENTANYL CITRATE (PF) 100 MCG/2ML IJ SOLN
INTRAMUSCULAR | Status: AC
  Filled 2020-07-13: qty 4

## 2020-07-13 MED ORDER — PROPOFOL 10 MG/ML IV BOLUS
INTRAVENOUS | Status: AC
  Filled 2020-07-13: qty 20

## 2020-07-13 MED ORDER — LEVOTHYROXINE SODIUM 75 MCG PO TABS
75.0000 ug | ORAL_TABLET | Freq: Every day | ORAL | Status: DC
  Administered 2020-07-14: 75 ug via ORAL
  Filled 2020-07-13: qty 1

## 2020-07-13 MED ORDER — PROPOFOL 500 MG/50ML IV EMUL
INTRAVENOUS | Status: DC | PRN
  Administered 2020-07-13: 50 ug/kg/min via INTRAVENOUS

## 2020-07-13 MED ORDER — ASPIRIN EC 325 MG PO TBEC
325.0000 mg | DELAYED_RELEASE_TABLET | Freq: Two times a day (BID) | ORAL | Status: DC
  Administered 2020-07-14: 325 mg via ORAL
  Filled 2020-07-13: qty 1

## 2020-07-13 MED ORDER — CEFAZOLIN SODIUM-DEXTROSE 2-4 GM/100ML-% IV SOLN
2.0000 g | INTRAVENOUS | Status: AC
  Administered 2020-07-13: 2 g via INTRAVENOUS
  Filled 2020-07-13: qty 100

## 2020-07-13 MED ORDER — GABAPENTIN 300 MG PO CAPS
600.0000 mg | ORAL_CAPSULE | Freq: Four times a day (QID) | ORAL | Status: DC
  Administered 2020-07-13 – 2020-07-14 (×3): 600 mg via ORAL
  Filled 2020-07-13 (×3): qty 2

## 2020-07-13 MED ORDER — FENTANYL CITRATE (PF) 100 MCG/2ML IJ SOLN
25.0000 ug | INTRAMUSCULAR | Status: DC | PRN
  Administered 2020-07-13: 50 ug via INTRAVENOUS

## 2020-07-13 MED ORDER — ALBUMIN HUMAN 5 % IV SOLN: INTRAVENOUS | Status: DC | PRN

## 2020-07-13 MED ORDER — TRANEXAMIC ACID-NACL 1000-0.7 MG/100ML-% IV SOLN
1000.0000 mg | INTRAVENOUS | Status: AC
  Administered 2020-07-13: 1000 mg via INTRAVENOUS
  Filled 2020-07-13: qty 100

## 2020-07-13 MED ORDER — TRAMADOL HCL 50 MG PO TABS
50.0000 mg | ORAL_TABLET | Freq: Four times a day (QID) | ORAL | Status: DC | PRN
Start: 2020-07-13 — End: 2020-07-14
  Administered 2020-07-13 – 2020-07-14 (×4): 100 mg via ORAL
  Filled 2020-07-13 (×4): qty 2

## 2020-07-13 MED ORDER — PANTOPRAZOLE SODIUM 40 MG PO TBEC
40.0000 mg | DELAYED_RELEASE_TABLET | Freq: Every day | ORAL | Status: DC
  Administered 2020-07-14: 40 mg via ORAL
  Filled 2020-07-13: qty 1

## 2020-07-13 MED ORDER — METHOCARBAMOL 500 MG PO TABS
500.0000 mg | ORAL_TABLET | Freq: Four times a day (QID) | ORAL | Status: DC | PRN
  Administered 2020-07-14: 500 mg via ORAL
  Filled 2020-07-13: qty 1

## 2020-07-13 MED ORDER — DEXAMETHASONE SODIUM PHOSPHATE 10 MG/ML IJ SOLN
INTRAMUSCULAR | Status: AC
  Filled 2020-07-13: qty 2

## 2020-07-13 MED ORDER — AMISULPRIDE (ANTIEMETIC) 5 MG/2ML IV SOLN: 10.0000 mg | Freq: Once | INTRAVENOUS | Status: DC | PRN

## 2020-07-13 MED ORDER — CEFAZOLIN SODIUM-DEXTROSE 2-4 GM/100ML-% IV SOLN
2.0000 g | Freq: Four times a day (QID) | INTRAVENOUS | Status: AC
Start: 2020-07-13 — End: 2020-07-14
  Administered 2020-07-13 – 2020-07-14 (×2): 2 g via INTRAVENOUS
  Filled 2020-07-13 (×2): qty 100

## 2020-07-13 MED ORDER — ACETAMINOPHEN 10 MG/ML IV SOLN
1000.0000 mg | Freq: Four times a day (QID) | INTRAVENOUS | Status: DC
  Administered 2020-07-13: 1000 mg via INTRAVENOUS
  Filled 2020-07-13: qty 100

## 2020-07-13 MED ORDER — CHLORHEXIDINE GLUCONATE 0.12 % MT SOLN
15.0000 mL | Freq: Once | OROMUCOSAL | Status: AC
  Administered 2020-07-13: 15 mL via OROMUCOSAL

## 2020-07-13 MED ORDER — PROPOFOL 500 MG/50ML IV EMUL
INTRAVENOUS | Status: AC
  Filled 2020-07-13: qty 100

## 2020-07-13 MED ORDER — LORATADINE 10 MG PO TABS
10.0000 mg | ORAL_TABLET | Freq: Every day | ORAL | Status: DC
  Administered 2020-07-14: 10 mg via ORAL
  Filled 2020-07-13: qty 1

## 2020-07-13 MED ORDER — BUPIVACAINE HCL 0.25 % IJ SOLN
INTRAMUSCULAR | Status: DC | PRN
  Administered 2020-07-13: 30 mL

## 2020-07-13 MED ORDER — FENTANYL CITRATE (PF) 100 MCG/2ML IJ SOLN
INTRAMUSCULAR | Status: DC | PRN
  Administered 2020-07-13: 100 ug via INTRAVENOUS

## 2020-07-13 MED ORDER — SODIUM CHLORIDE 0.9 % IV SOLN: INTRAVENOUS | Status: DC

## 2020-07-13 MED ORDER — MIDAZOLAM HCL 5 MG/5ML IJ SOLN
INTRAMUSCULAR | Status: DC | PRN
  Administered 2020-07-13 (×2): 1 mg via INTRAVENOUS

## 2020-07-13 MED ORDER — POLYETHYLENE GLYCOL 3350 17 G PO PACK: 17.0000 g | PACK | Freq: Every day | ORAL | Status: DC | PRN

## 2020-07-13 MED ORDER — EPHEDRINE SULFATE-NACL 50-0.9 MG/10ML-% IV SOSY
PREFILLED_SYRINGE | INTRAVENOUS | Status: DC | PRN
  Administered 2020-07-13: 10 mg via INTRAVENOUS

## 2020-07-13 MED ORDER — MORPHINE SULFATE (PF) 2 MG/ML IV SOLN: 0.5000 mg | INTRAVENOUS | Status: DC | PRN

## 2020-07-13 MED ORDER — MENTHOL 3 MG MT LOZG: 1.0000 | LOZENGE | OROMUCOSAL | Status: DC | PRN

## 2020-07-13 MED ORDER — ONDANSETRON HCL 4 MG/2ML IJ SOLN: 4.0000 mg | Freq: Four times a day (QID) | INTRAMUSCULAR | Status: DC | PRN

## 2020-07-13 MED ORDER — ONDANSETRON HCL 4 MG/2ML IJ SOLN
INTRAMUSCULAR | Status: DC | PRN
  Administered 2020-07-13: 4 mg via INTRAVENOUS

## 2020-07-13 MED ORDER — BUPIVACAINE IN DEXTROSE 0.75-8.25 % IT SOLN
INTRATHECAL | Status: DC | PRN
  Administered 2020-07-13: 1.8 mL via INTRATHECAL

## 2020-07-13 MED ORDER — DEXAMETHASONE SODIUM PHOSPHATE 10 MG/ML IJ SOLN
10.0000 mg | Freq: Once | INTRAMUSCULAR | Status: AC
  Administered 2020-07-14: 10 mg via INTRAVENOUS
  Filled 2020-07-13: qty 1

## 2020-07-13 MED ORDER — METHOCARBAMOL 500 MG IVPB - SIMPLE MED
500.0000 mg | Freq: Four times a day (QID) | INTRAVENOUS | Status: DC | PRN
  Administered 2020-07-13: 500 mg via INTRAVENOUS
  Filled 2020-07-13: qty 50

## 2020-07-13 MED ORDER — MAGNESIUM CITRATE PO SOLN: 1.0000 | Freq: Once | ORAL | Status: DC | PRN

## 2020-07-13 MED ORDER — METOCLOPRAMIDE HCL 5 MG/ML IJ SOLN: 5.0000 mg | Freq: Three times a day (TID) | INTRAMUSCULAR | Status: DC | PRN

## 2020-07-13 MED ORDER — METHOCARBAMOL 500 MG IVPB - SIMPLE MED
INTRAVENOUS | Status: AC
  Filled 2020-07-13: qty 50

## 2020-07-13 SURGICAL SUPPLY — 42 items
BAG DECANTER FOR FLEXI CONT (MISCELLANEOUS) IMPLANT
BAG ZIPLOCK 12X15 (MISCELLANEOUS) IMPLANT
BLADE SAG 18X100X1.27 (BLADE) ×2 IMPLANT
COVER PERINEAL POST (MISCELLANEOUS) ×2 IMPLANT
COVER SURGICAL LIGHT HANDLE (MISCELLANEOUS) ×2 IMPLANT
COVER WAND RF STERILE (DRAPES) IMPLANT
CUP ACETBLR 52 OD PINNACLE (Hips) ×2 IMPLANT
DECANTER SPIKE VIAL GLASS SM (MISCELLANEOUS) ×2 IMPLANT
DRAPE STERI IOBAN 125X83 (DRAPES) ×2 IMPLANT
DRAPE U-SHAPE 47X51 STRL (DRAPES) ×4 IMPLANT
DRSG AQUACEL AG ADV 3.5X10 (GAUZE/BANDAGES/DRESSINGS) ×2 IMPLANT
DURAPREP 26ML APPLICATOR (WOUND CARE) ×2 IMPLANT
ELECT REM PT RETURN 15FT ADLT (MISCELLANEOUS) ×2 IMPLANT
EVACUATOR 1/8 PVC DRAIN (DRAIN) IMPLANT
GLOVE SRG 8 PF TXTR STRL LF DI (GLOVE) ×2 IMPLANT
GLOVE SURG ENC MOIS LTX SZ6 (GLOVE) IMPLANT
GLOVE SURG ENC MOIS LTX SZ7 (GLOVE) ×2 IMPLANT
GLOVE SURG ENC MOIS LTX SZ8 (GLOVE) ×2 IMPLANT
GLOVE SURG ENC TEXT LTX SZ7 (GLOVE) ×4 IMPLANT
GLOVE SURG UNDER POLY LF SZ6.5 (GLOVE) IMPLANT
GLOVE SURG UNDER POLY LF SZ8 (GLOVE) ×4
GLOVE SURG UNDER POLY LF SZ8.5 (GLOVE) ×2 IMPLANT
GOWN STRL REUS W/TWL LRG LVL3 (GOWN DISPOSABLE) ×2 IMPLANT
GOWN STRL REUS W/TWL XL LVL3 (GOWN DISPOSABLE) ×4 IMPLANT
HEAD FEM STD 32X+13 STRL (Hips) ×2 IMPLANT
HOLDER FOLEY CATH W/STRAP (MISCELLANEOUS) ×2 IMPLANT
KIT TURNOVER KIT A (KITS) ×2 IMPLANT
LINER MARATHON NEUT +4X52X32 (Hips) ×2 IMPLANT
MANIFOLD NEPTUNE II (INSTRUMENTS) ×2 IMPLANT
PACK ANTERIOR HIP CUSTOM (KITS) ×2 IMPLANT
PENCIL SMOKE EVACUATOR COATED (MISCELLANEOUS) ×2 IMPLANT
STEM FEMORAL SZ 5MM STD ACTIS (Stem) ×2 IMPLANT
STRIP CLOSURE SKIN 1/2X4 (GAUZE/BANDAGES/DRESSINGS) ×2 IMPLANT
SUT ETHIBOND NAB CT1 #1 30IN (SUTURE) ×2 IMPLANT
SUT MNCRL AB 4-0 PS2 18 (SUTURE) ×2 IMPLANT
SUT STRATAFIX 0 PDS 27 VIOLET (SUTURE) ×2
SUT VIC AB 2-0 CT1 27 (SUTURE) ×4
SUT VIC AB 2-0 CT1 TAPERPNT 27 (SUTURE) ×2 IMPLANT
SUTURE STRATFX 0 PDS 27 VIOLET (SUTURE) ×1 IMPLANT
SYR 50ML LL SCALE MARK (SYRINGE) IMPLANT
TRAY FOLEY MTR SLVR 14FR STAT (SET/KITS/TRAYS/PACK) ×2 IMPLANT
TUBE SUCTION HIGH CAP CLEAR NV (SUCTIONS) ×2 IMPLANT

## 2020-07-13 NOTE — Interval H&P Note (Signed)
History and Physical Interval Note:  07/13/2020 11:56 AM  Kristine [name]  has presented today for surgery, with the diagnosis of right hip osteoarthritis.  The various methods of treatment have been discussed with the patient and family. After consideration of risks, benefits and other options for treatment, the patient has consented to  Procedure(s) with comments: [place] (Right) - 116min as a surgical intervention.  The patient's history has been reviewed, patient examined, no change in status, stable for surgery.  I have reviewed the patient's chart and labs.  Questions were answered to the patient's satisfaction.     Kristine [name]

## 2020-07-13 NOTE — Anesthesia Procedure Notes (Signed)
Date/Time: 07/13/2020 1:07 PM Performed by: Talbot Grumbling, CRNA Oxygen Delivery Method: Simple face mask

## 2020-07-13 NOTE — Anesthesia Postprocedure Evaluation (Signed)
Anesthesia Post Note  Patient: Kristine [name]  Procedure(s) Performed: TOTAL HIP ARTHROPLASTY ANTERIOR APPROACH (Right Hip)     Patient location during evaluation: PACU Anesthesia Type: Spinal Level of consciousness: awake and alert Pain management: pain level controlled Vital Signs Assessment: post-procedure vital signs reviewed and stable Respiratory status: spontaneous breathing and respiratory function stable Cardiovascular status: blood pressure returned to baseline and stable Postop Assessment: spinal receding Anesthetic complications: no   No complications documented.  Last Vitals:  Vitals:   07/13/20 1726 07/13/20 1954  BP: (!) 177/74 (!) 158/80  Pulse: 63 67  Resp: 15 17  Temp: 36.4 C (!) 36.3 C  SpO2: 100% 98%    Last Pain:  Vitals:   07/13/20 1954  TempSrc: Oral  PainSc:                  Tiajuana Amass

## 2020-07-13 NOTE — Anesthesia Preprocedure Evaluation (Addendum)
Anesthesia Evaluation  Patient identified by MRN, date of birth, ID band Patient awake    Reviewed: Allergy & Precautions, NPO status , Patient's Chart, lab work & pertinent test results  Airway Mallampati: II  TM Distance: >3 FB Neck ROM: Full    Dental  (+) Dental Advisory Given   Pulmonary neg pulmonary ROS,    breath sounds clear to auscultation       Cardiovascular hypertension, Pt. on medications  Rhythm:Regular Rate:Normal     Neuro/Psych  Headaches,  Neuromuscular disease    GI/Hepatic Neg liver ROS, GERD  Medicated,  Endo/Other  Hypothyroidism   Renal/GU negative Renal ROS     Musculoskeletal  (+) Arthritis ,   Abdominal   Peds  Hematology  (+) anemia ,   Anesthesia Other Findings   Reproductive/Obstetrics                             Lab Results  Component Value Date   WBC 6.7 07/04/2020   HGB 13.6 07/04/2020   HCT 39.9 07/04/2020   MCV 89.9 07/04/2020   PLT 227 07/04/2020   Lab Results  Component Value Date   CREATININE 0.63 07/04/2020   BUN 17 07/04/2020   NA 141 07/04/2020   K 3.9 07/04/2020   CL 104 07/04/2020   CO2 23 07/04/2020    Anesthesia Physical Anesthesia Plan  ASA: III  Anesthesia Plan: Spinal   Post-op Pain Management:    Induction:   PONV Risk Score and Plan: 2 and Propofol infusion, Ondansetron and Treatment may vary due to age or medical condition  Airway Management Planned: Natural Airway and Simple Face Mask  Additional Equipment:   Intra-op Plan:   Post-operative Plan:   Informed Consent: I have reviewed the patients History and Physical, chart, labs and discussed the procedure including the risks, benefits and alternatives for the proposed anesthesia with the patient or authorized representative who has indicated his/her understanding and acceptance.       Plan Discussed with: CRNA  Anesthesia Plan Comments:         Anesthesia Quick Evaluation

## 2020-07-13 NOTE — Anesthesia Procedure Notes (Signed)
Spinal  Patient location during procedure: OR Start time: 07/13/2020 1:05 PM End time: 07/13/2020 1:10 PM Preanesthetic Checklist Completed: patient identified, IV checked, site marked, risks and benefits discussed, surgical consent, monitors and equipment checked, pre-op evaluation and timeout performed Spinal Block Patient position: sitting Prep: DuraPrep Patient monitoring: heart rate, cardiac monitor, continuous pulse ox and blood pressure Approach: midline Location: L4-5 Injection technique: single-shot Needle Needle type: Pencan  Needle gauge: 24 G Needle length: 9 cm Assessment Sensory level: T4

## 2020-07-13 NOTE — Interval H&P Note (Signed)
History and Physical Interval Note:  07/13/2020 11:56 AM  Kristine [name]  has presented today for surgery, with the diagnosis of right hip osteoarthritis.  The various methods of treatment have been discussed with the patient and family. After consideration of risks, benefits and other options for treatment, the patient has consented to  Procedure(s) with comments: [place] (Right) - 18min as a surgical intervention.  The patient's history has been reviewed, patient examined, no change in status, stable for surgery.  I have reviewed the patient's chart and labs.  Questions were answered to the patient's satisfaction.     Pilar Plate Vonnetta Akey

## 2020-07-13 NOTE — Op Note (Signed)
OPERATIVE REPORT- TOTAL HIP ARTHROPLASTY   PREOPERATIVE DIAGNOSIS: Osteoarthritis of the Right hip.   POSTOPERATIVE DIAGNOSIS: Osteoarthritis of the Right  hip.   PROCEDURE: Right total hip arthroplasty, anterior approach.   SURGEON: Gaynelle Arabian, MD   ASSISTANT: Theresa Duty, PA-C  ANESTHESIA:  Spinal  ESTIMATED BLOOD LOSS:-850 mL    DRAINS: Hemovac x1.   COMPLICATIONS: None   CONDITION: PACU - hemodynamically stable.   BRIEF CLINICAL NOTE: Kristine [name] is a 80 y.o. female who has advanced end-  stage arthritis of their Right  hip with progressively worsening pain and  dysfunction.The patient has failed nonoperative management and presents for  total hip arthroplasty.   PROCEDURE IN DETAIL: After successful administration of spinal  anesthetic, the traction boots for the [facility] bed were placed on both  feet and the patient was placed onto the [facility] bed, boots placed into the leg  holders. The Right hip was then isolated from the perineum with plastic  drapes and prepped and draped in the usual sterile fashion. ASIS and  greater trochanter were marked and a oblique incision was made, starting  at about 1 cm lateral and 2 cm distal to the ASIS and coursing towards  the anterior cortex of the femur. The skin was cut with a 10 blade  through subcutaneous tissue to the level of the fascia overlying the  tensor fascia lata muscle. The fascia was then incised in line with the  incision at the junction of the anterior third and posterior 2/3rd. The  muscle was teased off the fascia and then the interval between the TFL  and the rectus was developed. The Hohmann retractor was then placed at  the top of the femoral neck over the capsule. The vessels overlying the  capsule were cauterized and the fat on top of the capsule was removed.  A Hohmann retractor was then placed anterior underneath the rectus  femoris to give exposure to the entire anterior capsule. A T-shaped   capsulotomy was performed. The edges were tagged and the femoral head  was identified.       Osteophytes are removed off the superior acetabulum.  The femoral neck was then cut in situ with an oscillating saw. Traction  was then applied to the left lower extremity utilizing the [facility]  traction. The femoral head was then removed. Retractors were placed  around the acetabulum and then circumferential removal of the labrum was  performed. Osteophytes were also removed. Reaming starts at 47 mm to  medialize and  Increased in 2 mm increments to 51 mm. We reamed in  approximately 40 degrees of abduction, 20 degrees anteversion. A 52 mm  pinnacle acetabular shell was then impacted in anatomic position under  fluoroscopic guidance with excellent purchase. We did not need to place  any additional dome screws. A 32 mm neutral + 4 marathon liner was then  placed into the acetabular shell.       The femoral lift was then placed along the lateral aspect of the femur  just distal to the vastus ridge. The leg was  externally rotated and capsule  was stripped off the inferior aspect of the femoral neck down to the  level of the lesser trochanter, this was done with electrocautery. The femur was lifted after this was performed. The  leg was then placed in an extended and adducted position essentially delivering the femur. We also removed the capsule superiorly and the piriformis from the piriformis  fossa to gain excellent exposure of the  proximal femur. Rongeur was used to remove some cancellous bone to get  into the lateral portion of the proximal femur for placement of the  initial starter reamer. The starter broaches was placed  the starter broach  and was shown to go down the center of the canal. Broaching  with the Actis system was then performed starting at size 0  coursing  Up to size 5. A size 5 had excellent torsional and rotational  and axial stability. The trial standard offset neck was then  placed  with a 32 + 13 trial head. The hip was then reduced. We confirmed that  the stem was in the canal both on AP and lateral x-rays. It also has excellent sizing. The hip was reduced with outstanding stability through full extension and full external rotation.. AP pelvis was taken and the leg lengths were measured and found to be equal. Hip was then dislocated again and the femoral head and neck removed. The  femoral broach was removed. Size 5 Actis stem with a standard offset  neck was then impacted into the femur following native anteversion. Has  excellent purchase in the canal. Excellent torsional and rotational and  axial stability. It is confirmed to be in the canal on AP and lateral  fluoroscopic views. The 32 + 13 metal head was placed and the hip  reduced with outstanding stability. Again AP pelvis was taken and it  confirmed that the leg lengths were equal. The wound was then copiously  irrigated with saline solution and the capsule reattached and repaired  with Ethibond suture. 30 ml of .25% Bupivicaine was  injected into the capsule and into the edge of the tensor fascia lata as well as subcutaneous tissue. The fascia overlying the tensor fascia lata was then closed with a running #1 V-Loc. Subcu was closed with interrupted 2-0 Vicryl and subcuticular running 4-0 Monocryl. Incision was cleaned  and dried. Steri-Strips and a bulky sterile dressing applied. Hemovac  drain was hooked to suction and then the patient was awakened and transported to  recovery in stable condition.        Please note that a surgical assistant was a medical necessity for this procedure to perform it in a safe and expeditious manner. Assistant was necessary to provide appropriate retraction of vital neurovascular structures and to prevent femoral fracture and allow for anatomic placement of the prosthesis.  Gaynelle Arabian, M.D.

## 2020-07-13 NOTE — Discharge Instructions (Signed)
Gaynelle Arabian, MD Total Joint Specialist [facility] [place] [address] [place], [place] [address] [phone]  ANTERIOR APPROACH TOTAL HIP REPLACEMENT POSTOPERATIVE DIRECTIONS     Hip Rehabilitation, Guidelines Following Surgery  The results of a hip operation are greatly improved after range of motion and muscle strengthening exercises. Follow all safety measures which are given to protect your hip. If any of these exercises cause increased pain or swelling in your joint, decrease the amount until you are comfortable again. Then slowly increase the exercises. Call your caregiver if you have problems or questions.   BLOOD CLOT PREVENTION . Take a 325 mg Aspirin two times a day for three weeks following surgery. Then take an 81 mg Aspirin once a day for three weeks. Then discontinue Aspirin. Dennis Bast may resume your vitamins/supplements upon discharge from the hospital. . Do not take any NSAIDs (Advil, Aleve, Ibuprofen, Meloxicam, etc.) until you have discontinued the 325 mg Aspirin.  HOME CARE INSTRUCTIONS  . Remove items at home which could result in a fall. This includes throw rugs or furniture in walking pathways.   ICE to the affected hip as frequently as 20-30 minutes an hour and then as needed for pain and swelling. Continue to use ice on the hip for pain and swelling from surgery. You may notice swelling that will progress down to the foot and ankle. This is normal after surgery. Elevate the leg when you are not up walking on it.    Continue to use the breathing machine which will help keep your temperature down.  It is common for your temperature to cycle up and down following surgery, especially at night when you are not up moving around and exerting yourself.  The breathing machine keeps your lungs expanded and your temperature down.  DIET You may resume your previous home diet once your are discharged from the hospital.  DRESSING / WOUND CARE /  SHOWERING . You have an adhesive waterproof bandage over the incision. Leave this in place until your first follow-up appointment. Once you remove this you will not need to place another bandage.  . You may begin showering 3 days following surgery, but do not submerge the incision under water.  ACTIVITY . For the first 3-5 days, it is important to rest and keep the operative leg elevated. You should, as a general rule, rest for 50 minutes and walk/stretch for 10 minutes per hour. After 5 days, you may slowly increase activity as tolerated.  Marland Kitchen Perform the exercises you were provided twice a day for about 15-20 minutes each session. Begin these 2 days following surgery. . Walk with your walker as instructed. Use the walker until you are comfortable transitioning to a cane. Walk with the cane in the opposite hand of the operative leg. You may discontinue the cane once you are comfortable and walking steadily. . Avoid periods of inactivity such as sitting longer than an hour when not asleep. This helps prevent blood clots.  . Do not drive a car for 6 weeks or until released by your surgeon.  . Do not drive while taking narcotics.  TED HOSE STOCKINGS Wear the elastic stockings on both legs for three weeks following surgery during the day. You may remove them at night while sleeping.  WEIGHT BEARING Weight bearing as tolerated with assist device (walker, cane, etc) as directed, use it as long as suggested by your surgeon or therapist, typically at least 4-6 weeks.  POSTOPERATIVE CONSTIPATION PROTOCOL Constipation -  defined medically as fewer than three stools per week and severe constipation as less than one stool per week.  One of the most common issues patients have following surgery is constipation.  Even if you have a regular bowel pattern at home, your normal regimen is likely to be disrupted due to multiple reasons following surgery.  Combination of anesthesia, postoperative narcotics, change in  appetite and fluid intake all can affect your bowels.  In order to avoid complications following surgery, here are some recommendations in order to help you during your recovery period.  . Colace (docusate) - Pick up an over-the-counter form of Colace or another stool softener and take twice a day as long as you are requiring postoperative pain medications.  Take with a full glass of water daily.  If you experience loose stools or diarrhea, hold the colace until you stool forms back up.  If your symptoms do not get better within 1 week or if they get worse, check with your doctor. . Dulcolax (bisacodyl) - Pick up over-the-counter and take as directed by the product packaging as needed to assist with the movement of your bowels.  Take with a full glass of water.  Use this product as needed if not relieved by Colace only.  . MiraLax (polyethylene glycol) - Pick up over-the-counter to have on hand.  MiraLax is a solution that will increase the amount of water in your bowels to assist with bowel movements.  Take as directed and can mix with a glass of water, juice, soda, coffee, or tea.  Take if you go more than two days without a movement.Do not use MiraLax more than once per day. Call your doctor if you are still constipated or irregular after using this medication for 7 days in a row.  If you continue to have problems with postoperative constipation, please contact the office for further assistance and recommendations.  If you experience "the worst abdominal pain ever" or develop nausea or vomiting, please contact the office immediatly for further recommendations for treatment.  ITCHING  If you experience itching with your medications, try taking only a single pain pill, or even half a pain pill at a time.  You can also use Benadryl over the counter for itching or also to help with sleep.   MEDICATIONS See your medication summary on the "After Visit Summary" that the nursing staff will review with you  prior to discharge.  You may have some home medications which will be placed on hold until you complete the course of blood thinner medication.  It is important for you to complete the blood thinner medication as prescribed by your surgeon.  Continue your approved medications as instructed at time of discharge.  PRECAUTIONS If you experience chest pain or shortness of breath - call 911 immediately for transfer to the hospital emergency department.  If you develop a fever greater that 101 F, purulent drainage from wound, increased redness or drainage from wound, foul odor from the wound/dressing, or calf pain - CONTACT YOUR SURGEON.                                                   FOLLOW-UP APPOINTMENTS Make sure you keep all of your appointments after your operation with your surgeon and caregivers. You should call the office at the above phone number and  make an appointment for approximately two weeks after the date of your surgery or on the date instructed by your surgeon outlined in the "After Visit Summary".  RANGE OF MOTION AND STRENGTHENING EXERCISES  These exercises are designed to help you keep full movement of your hip joint. Follow your caregiver's or physical therapist's instructions. Perform all exercises about fifteen times, three times per day or as directed. Exercise both hips, even if you have had only one joint replacement. These exercises can be done on a training (exercise) mat, on the floor, on a table or on a bed. Use whatever works the best and is most comfortable for you. Use music or television while you are exercising so that the exercises are a pleasant break in your day. This will make your life better with the exercises acting as a break in routine you can look forward to.  . Lying on your back, slowly slide your foot toward your buttocks, raising your knee up off the floor. Then slowly slide your foot back down until your leg is straight again.  . Lying on your back spread  your legs as far apart as you can without causing discomfort.  . Lying on your side, raise your upper leg and foot straight up from the floor as far as is comfortable. Slowly lower the leg and repeat.  . Lying on your back, tighten up the muscle in the front of your thigh (quadriceps muscles). You can do this by keeping your leg straight and trying to raise your heel off the floor. This helps strengthen the largest muscle supporting your knee.  . Lying on your back, tighten up the muscles of your buttocks both with the legs straight and with the knee bent at a comfortable angle while keeping your heel on the floor.   IF YOU ARE TRANSFERRED TO A SKILLED REHAB FACILITY If the patient is transferred to a skilled rehab facility following release from the hospital, a list of the current medications will be sent to the facility for the patient to continue.  When discharged from the skilled rehab facility, please have the facility set up the patient's Home Health Physical Therapy prior to being released. Also, the skilled facility will be responsible for providing the patient with their medications at time of release from the facility to include their pain medication, the muscle relaxants, and their blood thinner medication. If the patient is still at the rehab facility at time of the two week follow up appointment, the skilled rehab facility will also need to assist the patient in arranging follow up appointment in our office and any transportation needs.  MAKE SURE YOU:  . Understand these instructions.  . Get help right away if you are not doing well or get worse.    DENTAL ANTIBIOTICS:  In most cases prophylactic antibiotics for Dental procdeures after total joint surgery are not necessary.  Exceptions are as follows:  1. History of prior total joint infection  2. Severely immunocompromised (Organ Transplant, cancer chemotherapy, Rheumatoid biologic meds such as Humera)  3. Poorly controlled  diabetes (A1C &gt; 8.0, blood glucose over 200)  If you have one of these conditions, contact your surgeon for an antibiotic prescription, prior to your dental procedure.    Pick up stool softner and laxative for home use following surgery while on pain medications. Do not submerge incision under water. Please use good hand washing techniques while changing dressing each day. May shower starting three days after surgery. Please   use a clean towel to pat the incision dry following showers. Continue to use ice for pain and swelling after surgery. Do not use any lotions or creams on the incision until instructed by your surgeon.  

## 2020-07-13 NOTE — Transfer of Care (Signed)
Immediate Anesthesia Transfer of Care Note  Patient: Kristine [name]  Procedure(s) Performed: TOTAL HIP ARTHROPLASTY ANTERIOR APPROACH (Right Hip)  Patient Location: PACU  Anesthesia Type:Spinal  Level of Consciousness: awake and alert   Airway & Oxygen Therapy: Patient Spontanous Breathing and Patient connected to face mask oxygen  Post-op Assessment: Report given to RN and Post -op Vital signs reviewed and stable  Post vital signs: Reviewed and stable  Last Vitals:  Vitals Value Taken Time  BP 167/85 07/13/20 1615  Temp    Pulse 59 07/13/20 1629  Resp 19 07/13/20 1629  SpO2 99 % 07/13/20 1629  Vitals shown include unvalidated device data.  Last Pain:  Vitals:   07/13/20 1117  TempSrc:   PainSc: 0-No pain      Patients Stated Pain Goal: 3 (21/22/48 2500)  Complications: No complications documented.

## 2020-07-14 DIAGNOSIS — M1611 Unilateral primary osteoarthritis, right hip: Secondary | ICD-10-CM | POA: Diagnosis not present

## 2020-07-14 LAB — CBC
HCT: 30.9 % — ABNORMAL LOW (ref 36.0–46.0)
Hemoglobin: 10.4 g/dL — ABNORMAL LOW (ref 12.0–15.0)
MCH: 30.1 pg (ref 26.0–34.0)
MCHC: 33.7 g/dL (ref 30.0–36.0)
MCV: 89.6 fL (ref 80.0–100.0)
Platelets: 214 10*3/uL (ref 150–400)
RBC: 3.45 MIL/uL — ABNORMAL LOW (ref 3.87–5.11)
RDW: 13.2 % (ref 11.5–15.5)
WBC: 11.2 10*3/uL — ABNORMAL HIGH (ref 4.0–10.5)
nRBC: 0 % (ref 0.0–0.2)

## 2020-07-14 LAB — BASIC METABOLIC PANEL
Anion gap: 11 (ref 5–15)
BUN: 16 mg/dL (ref 8–23)
CO2: 21 mmol/L — ABNORMAL LOW (ref 22–32)
Calcium: 8.3 mg/dL — ABNORMAL LOW (ref 8.9–10.3)
Chloride: 104 mmol/L (ref 98–111)
Creatinine, Ser: 0.75 mg/dL (ref 0.44–1.00)
GFR, Estimated: >60 mL/min (ref >60)
Glucose, Bld: 163 mg/dL — ABNORMAL HIGH (ref 70–99)
Potassium: 4.2 mmol/L (ref 3.5–5.1)
Sodium: 136 mmol/L (ref 135–145)

## 2020-07-14 MED ORDER — TRAMADOL HCL 50 MG PO TABS: 50.0000 mg | ORAL_TABLET | Freq: Four times a day (QID) | ORAL | 0 refills | Status: DC | PRN

## 2020-07-14 MED ORDER — ASPIRIN 325 MG PO TBEC: 325.0000 mg | DELAYED_RELEASE_TABLET | Freq: Two times a day (BID) | ORAL | 0 refills | Status: AC

## 2020-07-14 MED ORDER — HYDROCODONE-ACETAMINOPHEN 5-325 MG PO TABS: 1.0000 | ORAL_TABLET | Freq: Four times a day (QID) | ORAL | 0 refills | Status: DC | PRN

## 2020-07-14 MED ORDER — METHOCARBAMOL 500 MG PO TABS: 500.0000 mg | ORAL_TABLET | Freq: Four times a day (QID) | ORAL | 0 refills | Status: DC | PRN

## 2020-07-14 NOTE — TOC Transition Note (Signed)
Transition of Care [facility]) - CM/SW Discharge Note   Patient Details  Name: Kristine [name] MRN: [id number] Date of Birth: 1940-07-20  Transition of Care [facility]) CM/SW Contact:  Lia Hopping, [place] Phone Number: 07/14/2020, 11:14 AM   Clinical Narrative:    Prearranged Therapy Plan: HEP RW and 3 IN1 delivered to the patient bedside by [employer]  Final next level of care: Home/Self Care Barriers to Discharge: No Barriers Identified   Patient Goals and CMS Choice       Discharge Placement                       Discharge Plan and Services                DME Arranged: 3-N-1,Walker rolling DME Agency: [employer] Date DME Agency Contacted: 07/14/20 Time DME Agency Contacted: 0900             Social Determinants of Health (SDOH) Interventions     Readmission Risk Interventions No flowsheet data found.

## 2020-07-14 NOTE — Plan of Care (Signed)
  Problem: Education: Goal: Knowledge of the prescribed therapeutic regimen will improve Outcome: Adequate for Discharge Goal: Understanding of discharge needs will improve Outcome: Adequate for Discharge   Problem: Activity: Goal: Ability to avoid complications of mobility impairment will improve Outcome: Adequate for Discharge Goal: Ability to tolerate increased activity will improve Outcome: Adequate for Discharge   Problem: Clinical Measurements: Goal: Postoperative complications will be avoided or minimized Outcome: Adequate for Discharge   Problem: Pain Management: Goal: Pain level will decrease with appropriate interventions Outcome: Adequate for Discharge

## 2020-07-14 NOTE — Plan of Care (Signed)
Plan of care reviewed and discussed with the patient. 

## 2020-07-14 NOTE — Progress Notes (Signed)
   Subjective: 1 Day Post-Op Procedure(s) (LRB): TOTAL HIP ARTHROPLASTY ANTERIOR APPROACH (Right) Patient reports pain as mild.   Patient seen in rounds by Dr. Wynelle Link. Patient is well, and has had no acute complaints or problems other than soreness in the right hip. Denies chest pain or SOB, no issues overnight. Foley catheter removed this AM. We will begin therapy today.   Objective: Vital signs in last 24 hours: Temp:  [97.4 F (36.3 C)-98 F (36.7 C)] 97.5 F (36.4 C) (02/17 0642) Pulse Rate:  [56-79] 60 (02/17 0642) Resp:  [14-19] 17 (02/17 0642) BP: (123-182)/(62-85) 123/70 (02/17 0642) SpO2:  [97 %-100 %] 99 % (02/17 0642) Weight:  [107.3 kg] 107.3 kg (02/16 1112)  Intake/Output from previous day:  Intake/Output Summary (Last 24 hours) at 07/14/2020 0741 Last data filed at 07/14/2020 0649 Gross per 24 hour  Intake 5198.03 ml  Output 4450 ml  Net 748.03 ml     Intake/Output this shift: No intake/output data recorded.  Labs: Recent Labs    07/14/20 0320  HGB 10.4*   Recent Labs    07/14/20 0320  WBC 11.2*  RBC 3.45*  HCT 30.9*  PLT 214   Recent Labs    07/14/20 0320  NA 136  K 4.2  CL 104  CO2 21*  BUN 16  CREATININE 0.75  GLUCOSE 163*  CALCIUM 8.3*   No results for input(s): LABPT, INR in the last 72 hours.  Exam: General - Patient is Alert and Oriented Extremity - Neurologically intact Neurovascular intact Sensation intact distally Dorsiflexion/Plantar flexion intact Dressing - dressing C/D/I Motor Function - intact, moving foot and toes well on exam.   Past Medical History:  Diagnosis Date  . Allergy   . Anal fissure   . Anemia    pt denies   . Arthritis    hands, back, hips  . Back pain    L4 nerve root compression  . Benign tumor    left ear  . Cataract   . GERD (gastroesophageal reflux disease)   . High cholesterol   . Hypertension   . Hypothyroidism   . Neuropathy   . 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 Sinusitis     chronic  . Thyroid disease   . Vertigo    HX of, last episode over 18 months ago    Assessment/Plan: 1 Day Post-Op Procedure(s) (LRB): TOTAL HIP ARTHROPLASTY ANTERIOR APPROACH (Right) Active Problems:   OA (osteoarthritis) of hip   Osteoarthritis of right hip  Estimated body mass index is 38.76 kg/m as calculated from the following:   Height as of this encounter: 5' 5.5" (1.664 m).   Weight as of this encounter: 107.3 kg. Advance diet Up with therapy D/C IV fluids  DVT Prophylaxis - Aspirin Weight bearing as tolerated. Begin therapy.  Hemoglobin stable at 10.4, intraoperative blood loss ~850 mL  Plan is to go Home after hospital stay. Plan for discharge after two sessions of PT if meeting goals with HEP. Follow-up in the office in 2 weeks.   The PDMP database was reviewed today prior to any opioid medications being prescribed to this patient.  Theresa Duty, PA-C Orthopedic Surgery [phone] 07/14/2020, 7:41 AM

## 2020-07-14 NOTE — Progress Notes (Signed)
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  Pt reviewed car transfers and performed HEP with increased time and assist - written instruction including progression provided and reviewed.   Pt eager for dc home this date.  Follow Up Recommendations  Follow surgeon's recommendation for DC plan and follow-up therapies     Equipment Recommendations  None recommended by PT    Recommendations for Other Services       Precautions / Restrictions Precautions Precautions: Fall Restrictions Weight Bearing Restrictions: No Other Position/Activity Restrictions: WBAT    Mobility  Bed Mobility Overal bed mobility: Needs Assistance Bed Mobility: Supine to Sit     Supine to sit: Min guard Sit to supine: Min assist   General bed mobility comments: increased time with cues for sequence and use of gait belt to self assist    Transfers Overall transfer level: Needs assistance Equipment used: Rolling walker (2 wheeled) Transfers: Sit to/from Stand Sit to Stand: Min guard         General transfer comment: cues for LE management and use of UEs to self assist  Ambulation/Gait Ambulation/Gait assistance: Min guard;Supervision Gait Distance (Feet): 40 Feet Assistive device: Rolling walker (2 wheeled) Gait Pattern/deviations: Step-to pattern;Decreased step length - right;Decreased step length - left;Shuffle;Trunk flexed Gait velocity: decr   General Gait Details: increased time with cues for posture, position from RW and sequence   Stairs Stairs: Yes Stairs assistance: Min assist Stair Management: No rails;One rail Right;Step to pattern;Forwards;Backwards;With walker;With crutches Number of Stairs: 6 General  stair comments: 4 steps fwd with rail and crutch; single step fwd and bkwd with RW; cues for sequence and foot/AD placement   Wheelchair Mobility    Modified Rankin (Stroke Patients Only)       Balance Overall balance assessment: Needs assistance Sitting-balance support: No upper extremity supported;Feet supported Sitting balance-Leahy Scale: Good     Standing balance support: Bilateral upper extremity supported Standing balance-Leahy Scale: Fair                              Cognition Arousal/Alertness: Awake/alert Behavior During Therapy: WFL for tasks assessed/performed Overall Cognitive Status: Within Functional Limits for tasks assessed                                        Exercises Total Joint Exercises Ankle Circles/Pumps: AROM;Both;15 reps;Supine Quad Sets: AROM;Both;10 reps;Supine Heel Slides: AAROM;Right;20 reps Hip ABduction/ADduction: AAROM;Right;15 reps;Supine    General Comments        Pertinent Vitals/Pain Pain Assessment: 0-10 Pain Score: 5  Pain Location: R hip Pain Descriptors / Indicators: Aching;Sore Pain Intervention(s): Limited activity within patient's tolerance;Monitored during session;Premedicated before session;Ice applied    Home Living Family/patient expects to be discharged to:: Private residence Living Arrangements: Spouse/significant other Available Help at Discharge: Family Type of Home: House Home Access: Stairs to enter Entrance Stairs-Rails: Right Home Layout: One level Home Equipment: Environmental consultant - 2 wheels;Cane - single point;Bedside commode      Prior Function Level of Independence: Independent  PT Goals (current goals can now be found in the care plan section) Acute Rehab PT Goals Patient Stated Goal: Regain IND PT Goal Formulation: With patient Time For Goal Achievement: 07/21/20 Potential to Achieve Goals: Good Progress towards PT goals: Progressing toward goals     Frequency    7X/week      PT Plan Current plan remains appropriate    Co-evaluation              AM-PAC PT "6 Clicks" Mobility   Outcome Measure  Help needed turning from your back to your side while in a flat bed without using bedrails?: A Lot Help needed moving from lying on your back to sitting on the side of a flat bed without using bedrails?: A Little Help needed moving to and from a bed to a chair (including a wheelchair)?: A Little Help needed standing up from a chair using your arms (e.g., wheelchair or bedside chair)?: A Little Help needed to walk in hospital room?: A Little Help needed climbing 3-5 steps with a railing? : A Little 6 Click Score: 17    End of Session Equipment Utilized During Treatment: Gait belt Activity Tolerance: Patient tolerated treatment well Patient left: in chair;with call bell/phone within reach;with chair alarm set Nurse Communication: Mobility status PT Visit Diagnosis: Difficulty in walking, not elsewhere classified (R26.2);Pain Pain - Right/Left: Right Pain - part of body: Hip     Time: 1341-1435 PT Time Calculation (min) (ACUTE ONLY): 54 min  Charges:  $Gait Training: 8-22 mins $Therapeutic Exercise: 23-37 mins $Therapeutic Activity: 8-22 mins                     Debe Coder PT Acute Rehabilitation Services Pager [phone] Office [phone]    Tiwan Schnitker 07/14/2020, 4:27 PM

## 2020-07-14 NOTE — Progress Notes (Signed)
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  Therex deferred 2* arrival of lunch.   Follow Up Recommendations  Follow surgeon's recommendation for DC plan and follow-up therapies     Equipment Recommendations  None recommended by PT    Recommendations for Other Services       Precautions / Restrictions Precautions Precautions: Fall Restrictions Weight Bearing Restrictions: No Other Position/Activity Restrictions: WBAT    Mobility  Bed Mobility Overal bed mobility: Needs Assistance Bed Mobility: Sit to Supine     Supine to sit: Min assist;Mod assist Sit to supine: Min assist   General bed mobility comments: increased time with cues for sequence and use of gait belt to self assist    Transfers Overall transfer level: Needs assistance Equipment used: Rolling walker (2 wheeled) Transfers: Sit to/from Stand Sit to Stand: Min assist         General transfer comment: cues for LE management and use of UEs to self assist  Ambulation/Gait Ambulation/Gait assistance: Min guard Gait Distance (Feet): 100 Feet Assistive device: Rolling walker (2 wheeled) Gait Pattern/deviations: Step-to pattern;Decreased step length - right;Decreased step length - left;Shuffle;Trunk flexed Gait velocity: decr   General Gait Details: increased time with cues for posture, position from RW and sequence   Stairs Stairs: Yes Stairs assistance: Min assist Stair Management: No rails;One rail Right;Step to pattern;Forwards;Backwards;With walker;With crutches Number of Stairs: 6 General stair comments: 4 steps fwd with rail and crutch; single step fwd and bkwd with RW; cues for sequence and  foot/AD placement   Wheelchair Mobility    Modified Rankin (Stroke Patients Only)       Balance Overall balance assessment: Needs assistance Sitting-balance support: No upper extremity supported;Feet supported Sitting balance-Leahy Scale: Good     Standing balance support: Bilateral upper extremity supported Standing balance-Leahy Scale: Fair                              Cognition Arousal/Alertness: Awake/alert Behavior During Therapy: WFL for tasks assessed/performed Overall Cognitive Status: Within Functional Limits for tasks assessed                                        Exercises Total Joint Exercises Ankle Circles/Pumps: AROM;Both;15 reps;Supine    General Comments        Pertinent Vitals/Pain Pain Assessment: 0-10 Pain Score: 5  Pain Location: R hip Pain Descriptors / Indicators: Aching;Sore Pain Intervention(s): Limited activity within patient's tolerance;Monitored during session;Premedicated before session;Ice applied    Home Living Family/patient expects to be discharged to:: Private residence Living Arrangements: Spouse/significant other Available Help at Discharge: Family Type of Home: House Home Access: Stairs to enter Entrance Stairs-Rails: Right Home Layout: One level Home Equipment: Environmental consultant - 2 wheels;Cane - single point;Bedside commode      Prior Function Level of Independence: Independent          PT Goals (current goals can now be found in the care plan section) Acute Rehab PT Goals Patient Stated Goal: Regain IND PT Goal Formulation: With patient  Time For Goal Achievement: 07/21/20 Potential to Achieve Goals: Good Progress towards PT goals: Progressing toward goals    Frequency    7X/week      PT Plan Current plan remains appropriate    Co-evaluation              AM-PAC PT "6 Clicks" Mobility   Outcome Measure  Help needed turning from your back to your side while in a flat bed without  using bedrails?: A Lot Help needed moving from lying on your back to sitting on the side of a flat bed without using bedrails?: A Little Help needed moving to and from a bed to a chair (including a wheelchair)?: A Little Help needed standing up from a chair using your arms (e.g., wheelchair or bedside chair)?: A Little Help needed to walk in hospital room?: A Little Help needed climbing 3-5 steps with a railing? : A Little 6 Click Score: 17    End of Session Equipment Utilized During Treatment: Gait belt Activity Tolerance: Patient tolerated treatment well Patient left: in bed;with call bell/phone within reach;with bed alarm set Nurse Communication: Mobility status PT Visit Diagnosis: Difficulty in walking, not elsewhere classified (R26.2);Pain Pain - Right/Left: Right Pain - part of body: Hip     Time: 6415-8309 PT Time Calculation (min) (ACUTE ONLY): 35 min  Charges:  $Gait Training: 23-37 mins                     [place] Pager [phone] Office [phone]    Jamy Cleckler 07/14/2020, 4:22 PM

## 2020-07-14 NOTE — Evaluation (Signed)
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  Pt should progress to dc home with family assist.    Follow Up Recommendations Follow surgeon's recommendation for DC plan and follow-up therapies    Equipment Recommendations  None recommended by PT    Recommendations for Other Services       Precautions / Restrictions Precautions Precautions: Fall Restrictions Weight Bearing Restrictions: No Other Position/Activity Restrictions: WBAT      Mobility  Bed Mobility Overal bed mobility: Needs Assistance Bed Mobility: Supine to Sit     Supine to sit: Min assist;Mod assist     General bed mobility comments: Increased time wtih cues for sequence and use of L LE to self assist    Transfers Overall transfer level: Needs assistance Equipment used: Rolling walker (2 wheeled) Transfers: Sit to/from Stand Sit to Stand: Min assist         General transfer comment: cues for LE management and use of UEs to self assist  Ambulation/Gait Ambulation/Gait assistance: Min assist Gait Distance (Feet): 90 Feet (and 15' into bathroom) Assistive device: Rolling walker (2 wheeled) Gait Pattern/deviations: Step-to pattern;Decreased step length - right;Decreased step length - left;Shuffle;Trunk flexed Gait velocity: decr   General Gait Details: increased time with cues for posture, position from RW and sequence  Stairs            Wheelchair Mobility    Modified Rankin (Stroke Patients Only)       Balance Overall balance assessment: Needs assistance Sitting-balance support: No upper extremity supported;Feet supported Sitting balance-Leahy Scale: Fair      Standing balance support: Bilateral upper extremity supported Standing balance-Leahy Scale: Poor                               Pertinent Vitals/Pain Pain Assessment: 0-10 Pain Score: 6  Pain Location: R hip Pain Descriptors / Indicators: Aching;Sore Pain Intervention(s): Limited activity within patient's tolerance;Monitored during session;Premedicated before session;Ice applied    Home Living Family/patient expects to be discharged to:: Private residence Living Arrangements: Spouse/significant other Available Help at Discharge: Family Type of Home: House Home Access: Stairs to enter Entrance Stairs-Rails: Right Entrance Stairs-Number of Steps: 5 Home Layout: One level Home Equipment: Environmental consultant - 2 wheels;Cane - single point;Bedside commode      Prior Function Level of Independence: Independent               Hand Dominance        Extremity/Trunk Assessment   Upper Extremity Assessment Upper Extremity Assessment: Overall WFL for tasks assessed    Lower Extremity Assessment Lower Extremity Assessment: RLE deficits/detail    Cervical / Trunk Assessment Cervical / Trunk Assessment: Normal  Communication   Communication: No difficulties  Cognition Arousal/Alertness: Awake/alert Behavior During Therapy: WFL for tasks assessed/performed Overall Cognitive Status: Within Functional Limits for tasks assessed                                        General Comments      Exercises Total Joint Exercises  Ankle Circles/Pumps: AROM;Both;15 reps;Supine   Assessment/Plan    PT Assessment Patient needs continued PT services  PT Problem List Decreased strength;Decreased range of motion;Decreased activity tolerance;Decreased balance;Decreased mobility;Decreased knowledge of use of DME;Obesity;Pain       PT Treatment Interventions DME instruction;Gait training;Stair training;Functional mobility training;Therapeutic activities;Therapeutic  exercise;Balance training;Patient/family education    PT Goals (Current goals can be found in the Care Plan section)  Acute Rehab PT Goals Patient Stated Goal: Regain IND PT Goal Formulation: With patient Time For Goal Achievement: 07/21/20 Potential to Achieve Goals: Good    Frequency 7X/week   Barriers to discharge        Co-evaluation               AM-PAC PT "6 Clicks" Mobility  Outcome Measure Help needed turning from your back to your side while in a flat bed without using bedrails?: A Lot Help needed moving from lying on your back to sitting on the side of a flat bed without using bedrails?: A Little Help needed moving to and from a bed to a chair (including a wheelchair)?: A Lot Help needed standing up from a chair using your arms (e.g., wheelchair or bedside chair)?: A Little Help needed to walk in hospital room?: A Little Help needed climbing 3-5 steps with a railing? : A Lot 6 Click Score: 15    End of Session Equipment Utilized During Treatment: Gait belt Activity Tolerance: Patient tolerated treatment well Patient left: in chair;with call bell/phone within reach;with chair alarm set Nurse Communication: Mobility status PT Visit Diagnosis: Difficulty in walking, not elsewhere classified (R26.2);Pain Pain - Right/Left: Right Pain - part of body: Hip    Time: 8768-1157 PT Time Calculation (min) (ACUTE ONLY): 33 min   Charges:   PT Evaluation $PT Eval Low Complexity: 1 Low PT Treatments $Gait Training: 8-22 mins        Debe Coder PT Acute Rehabilitation Services Pager [phone] Office [phone]   Pansey Pinheiro 07/14/2020, 4:13 PM

## 2020-07-14 NOTE — Progress Notes (Signed)
Pt was discharged home today. Instructions were reviewed with patient, and questions were answered. Pt was taken to main entrance via wheelchair by NT.  

## 2020-07-15 ENCOUNTER — Encounter (HOSPITAL_COMMUNITY): Payer: Self-pay | Admitting: Orthopedic Surgery

## 2020-07-17 LAB — TYPE AND SCREEN
ABO/RH(D): A NEG
Antibody Screen: NEGATIVE
Donor AG Type: NEGATIVE
Donor AG Type: NEGATIVE
Unit division: 0
Unit division: 0

## 2020-07-17 LAB — BPAM RBC
Blood Product Expiration Date: 202202252359
Blood Product Expiration Date: 202202252359
Unit Type and Rh: 600
Unit Type and Rh: 600

## 2020-07-18 ENCOUNTER — Other Ambulatory Visit: Payer: Self-pay | Admitting: Family Medicine

## 2020-07-18 NOTE — Discharge Summary (Signed)
Physician Discharge Summary   Patient ID: Kristine [name] MRN: [id number] DOB/AGE: 1941-03-28 80 y.o.  Admit date: 07/13/2020 Discharge date: 07/14/2020  Primary Diagnosis: Osteoarthritis of the right hip   Admission Diagnoses:  Past Medical History:  Diagnosis Date  . Allergy   . Anal fissure   . Anemia    pt denies   . Arthritis    hands, back, hips  . Back pain    L4 nerve root compression  . Benign tumor    left ear  . Cataract   . GERD (gastroesophageal reflux disease)   . High cholesterol   . Hypertension   . Hypothyroidism   . Neuropathy   . 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 Sinusitis    chronic  . Thyroid disease   . Vertigo    HX of, last episode over 18 months ago   Discharge Diagnoses:   Active Problems:   OA (osteoarthritis) of hip   Osteoarthritis of right hip  Estimated body mass index is 38.76 kg/m as calculated from the following:   Height as of this encounter: 5' 5.5" (1.664 m).   Weight as of this encounter: 107.3 kg.  Procedure:  Procedure(s) (LRB): TOTAL HIP ARTHROPLASTY ANTERIOR APPROACH (Right)   Consults: None  HPI: Kristine [name] is a 80 y.o. female who has advanced end-  stage arthritis of their Right  hip with progressively worsening pain and  dysfunction.The patient has failed nonoperative management and presents for  total hip arthroplasty.   Laboratory Data: Admission on 07/13/2020, Discharged on 07/14/2020  Component Date Value Ref Range Status  . ABO/RH(D) 07/13/2020 A NEG   Final  . Antibody Screen 07/13/2020 NEG   Final  . Sample Expiration 07/13/2020 07/16/2020,2359   Final  . Unit Number 07/13/2020 [id number]   Final  . Blood Component Type 07/13/2020 RED CELLS,LR   Final  . Unit division 07/13/2020 00   Final  . Status of Unit 07/13/2020 REL FROM [facility]   Final  . Donor AG Type 07/13/2020 NEGATIVE FOR C ANTIGEN   Final  . Transfusion Status 07/13/2020 OK TO TRANSFUSE   Final  . Crossmatch Result 07/13/2020  COMPATIBLE   Final  . Unit Number 07/13/2020 [id number]   Final  . Blood Component Type 07/13/2020 RED CELLS,LR   Final  . Unit division 07/13/2020 00   Final  . Status of Unit 07/13/2020 REL FROM [facility]   Final  . Donor AG Type 07/13/2020 NEGATIVE FOR C ANTIGEN   Final  . Transfusion Status 07/13/2020 OK TO TRANSFUSE   Final  . Crossmatch Result 07/13/2020 COMPATIBLE   Final  . Blood Product Unit Number 07/13/2020 [id number]   Final  . PRODUCT CODE 07/13/2020 [id number]   Final  . Unit Type and Rh 07/13/2020 0600   Final  . Blood Product Expiration Date 07/13/2020 [id number]   Final  . Blood Product Unit Number 07/13/2020 [id number]   Final  . PRODUCT CODE 07/13/2020 [id number]   Final  . Unit Type and Rh 07/13/2020 0600   Final  . Blood Product Expiration Date 07/13/2020 [id number]   Final  . WBC 07/14/2020 11.2* 4.0 - 10.5 K/uL Final  . RBC 07/14/2020 3.45* 3.87 - 5.11 MIL/uL Final  . Hemoglobin 07/14/2020 10.4* 12.0 - 15.0 g/dL Final  . HCT 07/14/2020 30.9* 36.0 - 46.0 % Final  . MCV 07/14/2020 89.6  80.0 - 100.0 fL Final  . MCH 07/14/2020 30.1  26.0 - 34.0 pg  Final  . MCHC 07/14/2020 33.7  30.0 - 36.0 g/dL Final  . RDW 07/14/2020 13.2  11.5 - 15.5 % Final  . Platelets 07/14/2020 214  150 - 400 K/uL Final  . nRBC 07/14/2020 0.0  0.0 - 0.2 % Final   Performed at [facility], [place] [address] [place], [place] [address]  . Sodium 07/14/2020 136  135 - 145 mmol/L Final  . Potassium 07/14/2020 4.2  3.5 - 5.1 mmol/L Final  . Chloride 07/14/2020 104  98 - 111 mmol/L Final  . CO2 07/14/2020 21* 22 - 32 mmol/L Final  . Glucose, Bld 07/14/2020 163* 70 - 99 mg/dL Final   Glucose reference range applies only to samples taken after fasting for at least 8 hours.  . BUN 07/14/2020 16  8 - 23 mg/dL Final  . Creatinine, Ser 07/14/2020 0.75  0.44 - 1.00 mg/dL Final  . Calcium 07/14/2020 8.3* 8.9 - 10.3 mg/dL Final  . GFR, Estimated 07/14/2020 >60  >60 mL/min Final    Comment: (NOTE) Calculated using the CKD-EPI Creatinine Equation (2021)   . Anion gap 07/14/2020 11  5 - 15 Final   Performed at [facility], [place] [address] [place], [place] [address]  Hospital Outpatient Visit on 07/09/2020  Component Date Value Ref Range Status  . SARS Coronavirus 2 07/09/2020 NEGATIVE  NEGATIVE Final   Comment: (NOTE) SARS-CoV-2 target nucleic acids are NOT DETECTED.  The SARS-CoV-2 RNA is generally detectable in upper and lower respiratory specimens during the acute phase of infection. Negative results do not preclude SARS-CoV-2 infection, do not rule out co-infections with other pathogens, and should not be used as the sole basis for treatment or other patient management decisions. Negative results must be combined with clinical observations, patient history, and epidemiological information. The expected result is Negative.  Fact Sheet for Patients: SugarRoll.be  Fact Sheet for Healthcare Providers: https://www.woods-mathews.com/  This test is not yet approved or cleared by the [place] FDA and  has been authorized for detection and/or diagnosis of SARS-CoV-2 by FDA under an Emergency Use Authorization (EUA). This EUA will remain  in effect (meaning this test can be used) for the duration of the COVID-19 declaration under Se                          ction 564(b)(1) of the Act, 21 U.S.C. section 360bbb-3(b)(1), unless the authorization is terminated or revoked sooner.  Performed at [facility], [place] [address], [place], [place] [address]   Lab on 07/05/2020  Component Date Value Ref Range Status  . Color, UA 07/05/2020 Yellow   Final  . Clarity, UA 07/05/2020 Clear   Final  . Glucose, UA 07/05/2020 Negative  Negative Final  . Bilirubin, UA 07/05/2020 Negative   Final  . Ketones, UA 07/05/2020 Negative   Final  . Spec Grav, UA 07/05/2020 1.025  1.010 - 1.025 Final  . Blood, UA  07/05/2020 Negative   Final  . pH, UA 07/05/2020 5.5  5.0 - 8.0 Final  . Protein, UA 07/05/2020 Negative  Negative Final  . Urobilinogen, UA 07/05/2020 0.2  0.2 or 1.0 E.U./dL Final  . Nitrite, UA 07/05/2020 Negative   Final  . Leukocytes, UA 07/05/2020 Negative  Negative Final  Hospital Outpatient Visit on 07/04/2020  Component Date Value Ref Range Status  . WBC 07/04/2020 6.7  4.0 - 10.5 K/uL Final  . RBC 07/04/2020 4.44  3.87 -  5.11 MIL/uL Final  . Hemoglobin 07/04/2020 13.6  12.0 - 15.0 g/dL Final  . HCT 07/04/2020 39.9  36.0 - 46.0 % Final  . MCV 07/04/2020 89.9  80.0 - 100.0 fL Final  . MCH 07/04/2020 30.6  26.0 - 34.0 pg Final  . MCHC 07/04/2020 34.1  30.0 - 36.0 g/dL Final  . RDW 07/04/2020 13.4  11.5 - 15.5 % Final  . Platelets 07/04/2020 227  150 - 400 K/uL Final  . nRBC 07/04/2020 0.0  0.0 - 0.2 % Final   Performed at [facility], [place] [address] [place], [place] [address]  . Sodium 07/04/2020 141  135 - 145 mmol/L Final  . Potassium 07/04/2020 3.9  3.5 - 5.1 mmol/L Final  . Chloride 07/04/2020 104  98 - 111 mmol/L Final  . CO2 07/04/2020 23  22 - 32 mmol/L Final  . Glucose, Bld 07/04/2020 146* 70 - 99 mg/dL Final   Glucose reference range applies only to samples taken after fasting for at least 8 hours.  . BUN 07/04/2020 17  8 - 23 mg/dL Final  . Creatinine, Ser 07/04/2020 0.63  0.44 - 1.00 mg/dL Final  . Calcium 07/04/2020 9.1  8.9 - 10.3 mg/dL Final  . Total Protein 07/04/2020 6.8  6.5 - 8.1 g/dL Final  . Albumin 07/04/2020 4.1  3.5 - 5.0 g/dL Final  . AST 07/04/2020 15  15 - 41 U/L Final  . ALT 07/04/2020 17  0 - 44 U/L Final  . Alkaline Phosphatase 07/04/2020 54  38 - 126 U/L Final  . Total Bilirubin 07/04/2020 0.8  0.3 - 1.2 mg/dL Final  . GFR, Estimated 07/04/2020 >60  >60 mL/min Final   Comment: (NOTE) Calculated using the CKD-EPI Creatinine Equation (2021)   . Anion gap 07/04/2020 14  5 - 15 Final   Performed at [facility], [place] [address], [place], [place] [address]  . Prothrombin Time 07/04/2020 13.5  11.4 - 15.2 seconds Final  . INR 07/04/2020 1.1  0.8 - 1.2 Final   Comment: (NOTE) INR goal varies based on device and disease states. Performed at [facility], [place] [address] [place], [place] [address]   . aPTT 07/04/2020 26  24 - 36 seconds Final   Performed at [facility], [place] [address], [place], [place] [address]  . ABO/RH(D) 07/04/2020 A NEG   Final  . Antibody Screen 07/04/2020 NEG   Final  . Sample Expiration 07/04/2020    Final                   Value:07/12/2020,2359 Performed at [facility], [place] [address], [place], [place] [address]   . Unit Number 07/04/2020 [id number]   Final  . Blood Component Type 07/04/2020 RED CELLS,LR   Final  . Unit division 07/04/2020 00   Final  . Status of Unit 07/04/2020 REL FROM [facility]   Final  . Transfusion Status 07/04/2020 OK TO TRANSFUSE   Final  . Crossmatch Result 07/04/2020 COMPATIBLE   Final  . Unit Number 07/04/2020 [id number]   Final  . Blood Component Type 07/04/2020 RED CELLS,LR   Final  . Unit division 07/04/2020 00   Final  . Status of Unit 07/04/2020 REL FROM [facility]   Final  . Transfusion Status 07/04/2020 OK TO TRANSFUSE   Final  . Crossmatch Result 07/04/2020 COMPATIBLE   Final  . MRSA, PCR 07/04/2020 NEGATIVE  NEGATIVE Final  . Staphylococcus aureus  07/04/2020 NEGATIVE  NEGATIVE Final   Comment: (NOTE) The Xpert SA Assay (FDA approved for NASAL specimens in patients 63 years of age and older), is one component of a comprehensive surveillance program. It is not intended to diagnose infection nor to guide or monitor treatment. Performed at [facility], [place] [address] [place] [address], [place], [place] [address]   . Blood Product Unit Number 07/04/2020 [id number]   Final  . PRODUCT CODE 07/04/2020 [id number]   Final  . Unit Type and Rh 07/04/2020 0600   Final  .  Blood Product Expiration Date 07/04/2020 [id number]   Final  . Blood Product Unit Number 07/04/2020 [id number]   Final  . PRODUCT CODE 07/04/2020 [id number]   Final  . Unit Type and Rh 07/04/2020 0600   Final  . Blood Product Expiration Date 07/04/2020 [id number]   Final  Lab on 06/14/2020  Component Date Value Ref Range Status  . Cholesterol 06/14/2020 199  0 - 200 mg/dL Final   ATP III Classification       Desirable:  < 200 mg/dL               Borderline High:  200 - 239 mg/dL          High:  > = 240 mg/dL  . Triglycerides 06/14/2020 203.0* 0.0 - 149.0 mg/dL Final   Normal:  <150 mg/dLBorderline High:  150 - 199 mg/dL  . HDL 06/14/2020 60.60  >39.00 mg/dL Final  . VLDL 06/14/2020 40.6* 0.0 - 40.0 mg/dL Final  . Total CHOL/HDL Ratio 06/14/2020 3   Final                  Men          Women1/2 Average Risk     3.4          3.3Average Risk          5.0          4.42X Average Risk          9.6          7.13X Average Risk          15.0          11.0                      . NonHDL 06/14/2020 138.50   Final   NOTE:  Non-HDL goal should be 30 mg/dL higher than patient's LDL goal (i.e. LDL goal of < 70 mg/dL, would have non-HDL goal of < 100 mg/dL)  . Direct LDL 06/14/2020 105.0  mg/dL Final   Optimal:  <100 mg/dLNear or Above Optimal:  100-129 mg/dLBorderline High:  130-159 mg/dLHigh:  160-189 mg/dLVery High:  >190 mg/dL  . Color, UA 06/14/2020 Yellow   Final  . Clarity, UA 06/14/2020 Clear   Final  . Glucose, UA 06/14/2020 Negative  Negative Final  . Bilirubin, UA 06/14/2020 Negative   Final  . Ketones, UA 06/14/2020 Negative   Final  . Spec Grav, UA 06/14/2020 1.010  1.010 - 1.025 Final  . Blood, UA 06/14/2020 Small (1+)   Final  . pH, UA 06/14/2020 7.0  5.0 - 8.0 Final  . Protein, UA 06/14/2020 Negative  Negative Final  . Urobilinogen, UA 06/14/2020 0.2  0.2 or 1.0 E.U./dL Final  . Nitrite, UA 06/14/2020 Negative   Final  . Leukocytes, UA 06/14/2020 Negative  Negative Final  Office  Visit on 05/31/2020  Component Date Value Ref Range  Status  . Hgb A1c MFr Bld 05/31/2020 6.0  4.6 - 6.5 % Final   Glycemic Control Guidelines for People with Diabetes:Non Diabetic:  <6%Goal of Therapy: <7%Additional Action Suggested:  >8%   . WBC 05/31/2020 7.8  4.0 - 10.5 K/uL Final  . RBC 05/31/2020 4.73  3.87 - 5.11 Mil/uL Final  . Hemoglobin 05/31/2020 13.9  12.0 - 15.0 g/dL Final  . HCT 05/31/2020 40.8  36.0 - 46.0 % Final  . MCV 05/31/2020 86.2  78.0 - 100.0 fl Final  . MCHC 05/31/2020 34.1  30.0 - 36.0 g/dL Final  . RDW 05/31/2020 15.3  11.5 - 15.5 % Final  . Platelets 05/31/2020 250.0  150.0 - 400.0 K/uL Final  . Neutrophils Relative % 05/31/2020 63.0  43.0 - 77.0 % Final  . Lymphocytes Relative 05/31/2020 28.3  12.0 - 46.0 % Final  . Monocytes Relative 05/31/2020 4.6  3.0 - 12.0 % Final  . Eosinophils Relative 05/31/2020 2.7  0.0 - 5.0 % Final  . Basophils Relative 05/31/2020 1.4  0.0 - 3.0 % Final  . Neutro Abs 05/31/2020 4.9  1.4 - 7.7 K/uL Final  . Lymphs Abs 05/31/2020 2.2  0.7 - 4.0 K/uL Final  . Monocytes Absolute 05/31/2020 0.4  0.1 - 1.0 K/uL Final  . Eosinophils Absolute 05/31/2020 0.2  0.0 - 0.7 K/uL Final  . Basophils Absolute 05/31/2020 0.1  0.0 - 0.1 K/uL Final  . Sodium 05/31/2020 139  135 - 145 mEq/L Final  . Potassium 05/31/2020 4.3  3.5 - 5.1 mEq/L Final  . Chloride 05/31/2020 103  96 - 112 mEq/L Final  . CO2 05/31/2020 28  19 - 32 mEq/L Final  . Glucose, Bld 05/31/2020 88  70 - 99 mg/dL Final  . BUN 05/31/2020 19  6 - 23 mg/dL Final  . Creatinine, Ser 05/31/2020 0.71  0.40 - 1.20 mg/dL Final  . Total Bilirubin 05/31/2020 0.6  0.2 - 1.2 mg/dL Final  . Alkaline Phosphatase 05/31/2020 53  39 - 117 U/L Final  . AST 05/31/2020 14  0 - 37 U/L Final  . ALT 05/31/2020 13  0 - 35 U/L Final  . Total Protein 05/31/2020 6.8  6.0 - 8.3 g/dL Final  . Albumin 05/31/2020 4.5  3.5 - 5.2 g/dL Final  . GFR 05/31/2020 80.32  >60.00 mL/min Final   Calculated using the  CKD-EPI Creatinine Equation (2021)  . Calcium 05/31/2020 9.4  8.4 - 10.5 mg/dL Final  . INR 05/31/2020 0.9  0.8 - 1.0 ratio Final  . Prothrombin Time 05/31/2020 10.5  9.6 - 13.1 sec Final  . Color, UA 05/31/2020 Yellow   Final  . Clarity, UA 05/31/2020 Clear   Final  . Glucose, UA 05/31/2020 Negative  Negative Final  . Bilirubin, UA 05/31/2020 Negative   Final  . Ketones, UA 05/31/2020 Negative   Final  . Spec Grav, UA 05/31/2020 1.020  1.010 - 1.025 Final  . Blood, UA 05/31/2020 Large   Final  . pH, UA 05/31/2020 6.0  5.0 - 8.0 Final  . Protein, UA 05/31/2020 Negative  Negative Final  . Urobilinogen, UA 05/31/2020 0.2  0.2 or 1.0 E.U./dL Final  . Nitrite, UA 05/31/2020 Negative   Final  . Leukocytes, UA 05/31/2020 Negative  Negative Final  . MICRO NUMBER: 05/31/2020 [id number]   Final  . SPECIMEN QUALITY: 05/31/2020 Adequate   Final  . Sample Source 05/31/2020 URINE, CLEAN CATCH   Final  . STATUS: 05/31/2020 FINAL   Final  .  ISOLATE 1: 05/31/2020 Streptococcus agalactiae*  Final   Comment: 10,000-49,000 CFU/mL of Group B Streptococcus isolated Beta-hemolytic streptococci are predictably susceptible to Penicillin and other beta-lactams. Susceptibility testing not routinely performed. Please contact the laboratory within 3 days if  susceptibility testing is desired. Erythromycin and clindamycin are not recommended for treatment of urinary tract infections, but clindamycin may be useful for treatment in penicillin allergic patients for rectovaginal colonization or for intrapartum  prophylaxis if indicated.      X-Rays:DG Pelvis Portable  Result Date: 07/13/2020 CLINICAL DATA:  Post right hip replacement EXAM: PORTABLE PELVIS 1-2 VIEWS COMPARISON:  None. FINDINGS: Post right total hip arthroplasty. No evidence of complication on this single view. Prior left total total hip arthroplasty. IMPRESSION: Standard appearance right total hip arthroplasty. Electronically Signed   By: Macy Mis  M.D.   On: 07/13/2020 16:31   DG C-Arm 1-60 Min-No Report  Result Date: 07/13/2020 Fluoroscopy was utilized by the requesting physician.  No radiographic interpretation.   MM 3D SCREEN BREAST BILATERAL  Result Date: 06/27/2020 CLINICAL DATA:  Screening. EXAM: DIGITAL SCREENING BILATERAL MAMMOGRAM WITH TOMO AND CAD COMPARISON:  Previous exam(s). ACR Breast Density Category b: There are scattered areas of fibroglandular density. FINDINGS: There are no findings suspicious for malignancy. The images were evaluated with computer-aided detection. IMPRESSION: No mammographic evidence of malignancy. A result letter of this screening mammogram will be mailed directly to the patient. RECOMMENDATION: Screening mammogram in one year. (Code:[id number]) BI-RADS CATEGORY  1: Negative. Electronically Signed   By: Dorise Bullion III M.D   On: 06/27/2020 14:53   DG HIP OPERATIVE UNILAT W OR W/O PELVIS RIGHT  Result Date: 07/13/2020 CLINICAL DATA:  Hip surgery. EXAM: OPERATIVE RIGHT HIP (WITH PELVIS IF PERFORMED) 2 VIEWS TECHNIQUE: Fluoroscopic spot image(s) were submitted for interpretation post-operatively. COMPARISON:  07/05/2009. FINDINGS: Total right hip replacement. Hardware intact. Anatomic alignment. Prior left hip replacement. IMPRESSION: Total right hip replacement with anatomic alignment. Electronically Signed   By: Marcello Moores  Register   On: 07/13/2020 16:16    EKG: Orders placed or performed in visit on 05/31/20  . EKG 12-Lead     Hospital Course: Kristine [name] is a 80 y.o. who was admitted to [facility]. They were brought to the operating room on 07/13/2020 and underwent Procedure(s): Advance.  Patient tolerated the procedure well and was later transferred to the recovery room and then to the orthopaedic floor for postoperative care. They were given PO and IV analgesics for pain control following their surgery. They were given 24 hours of postoperative  antibiotics of  Anti-infectives (From admission, onward)   Start     Dose/Rate Route Frequency Ordered Stop   07/13/20 2000  ceFAZolin (ANCEF) IVPB 2g/100 mL premix        2 g 200 mL/hr over 30 Minutes Intravenous Every 6 hours 07/13/20 1717 07/14/20 0145   07/13/20 1100  ceFAZolin (ANCEF) IVPB 2g/100 mL premix        2 g 200 mL/hr over 30 Minutes Intravenous On call to O.R. 07/13/20 1057 07/13/20 1315     and started on DVT prophylaxis in the form of Aspirin.   PT and OT were ordered for total joint protocol. Discharge planning consulted to help with postop disposition and equipment needs.  Patient had a good night on the evening of surgery. They started to get up OOB with therapy on POD #1. Pt was seen during rounds and was ready to go home pending  progress with therapy. She worked with therapy on POD #1 and was meeting her goals. Pt was discharged to home later that day in stable condition.  Diet: Regular diet Activity: WBAT Follow-up: in 2 weeks Disposition: Home with HEP Discharged Condition: stable   Discharge Instructions    Call MD / Call 911   Complete by: As directed    If you experience chest pain or shortness of breath, CALL 911 and be transported to the hospital emergency room.  If you develope a fever above 101 F, pus (white drainage) or increased drainage or redness at the wound, or calf pain, call your surgeon's office.   Change dressing   Complete by: As directed    You have an adhesive waterproof bandage over the incision. Leave this in place until your first follow-up appointment. Once you remove this you will not need to place another bandage.   Constipation Prevention   Complete by: As directed    Drink plenty of fluids.  Prune juice may be helpful.  You may use a stool softener, such as Colace (over the counter) 100 mg twice a day.  Use MiraLax (over the counter) for constipation as needed.   Diet - low sodium heart healthy   Complete by: As directed    Do not sit  on low chairs, stoools or toilet seats, as it may be difficult to get up from low surfaces   Complete by: As directed    Driving restrictions   Complete by: As directed    No driving for two weeks   TED hose   Complete by: As directed    Use stockings (TED hose) for three weeks on both leg(s).  You may remove them at night for sleeping.   Weight bearing as tolerated   Complete by: As directed      Allergies as of 07/14/2020      Reactions   Corn-containing Products Other (See Comments)   Stomach ache & diarrhea   Influenza Vaccines    flushing   Meperidine Hcl Other (See Comments)   (Demerol) Flushing      Medication List    STOP taking these medications   ibuprofen 200 MG tablet Commonly known as: ADVIL     TAKE these medications   amoxicillin 500 MG capsule Commonly known as: AMOXIL Take 500 mg by mouth See admin instructions. Take 4 capsules (2000 mg) by mouth 1 hour prior to dental procedures   aspirin 325 MG EC tablet Take 1 tablet (325 mg total) by mouth 2 (two) times daily for 20 days. Then take one 81 mg aspirin once a day for three weeks. Then discontinue aspirin.   colchicine 0.6 MG tablet Take 1 tablet (0.6 mg total) by mouth 2 (two) times daily as needed (for gout flare).   fexofenadine 180 MG tablet Commonly known as: ALLEGRA Take 180 mg by mouth daily.   Fish Oil 1200 MG Caps Take 3,600 mg by mouth 2 (two) times a day.   fluticasone 50 MCG/ACT nasal spray Commonly known as: FLONASE Place 2 sprays into both nostrils daily as needed (congestion/allergies.). Reported on 06/02/1094   folic acid 1 MG tablet Commonly known as: FOLVITE Take 1 mg by mouth daily.   gabapentin 600 MG tablet Commonly known as: NEURONTIN Take 600 mg by mouth 4 (four) times daily.   HYDROcodone-acetaminophen 5-325 MG tablet Commonly known as: NORCO/VICODIN Take 1-2 tablets by mouth every 6 (six) hours as needed for moderate pain or severe  pain.   levothyroxine 75 MCG  tablet Commonly known as: Synthroid TAKE 1 TABLET DAILY BEFORE BREAKFAST What changed:   how much to take  how to take this  when to take this   Lidocaine HCl 4 % Crea Apply 1 application topically 3 (three) times daily as needed (pain.).   methocarbamol 500 MG tablet Commonly known as: ROBAXIN Take 1 tablet (500 mg total) by mouth every 6 (six) hours as needed for muscle spasms.   methotrexate 2.5 MG tablet Commonly known as: RHEUMATREX Take 10 mg by mouth every Thursday.   montelukast 10 MG tablet Commonly known as: SINGULAIR TAKE 1 TABLET AT BEDTIME   omeprazole 20 MG capsule Commonly known as: PRILOSEC Take 20 mg by mouth daily.   polyethylene glycol 17 g packet Commonly known as: MIRALAX / GLYCOLAX Take 17 g by mouth daily as needed (constipation.).   Premarin 0.625 MG tablet Generic drug: estrogens (conjugated) TAKE 1 TABLET DAILY What changed:   how much to take  when to take this   simvastatin 40 MG tablet Commonly known as: ZOCOR TAKE 1 TABLET (40MG ) BY MOUTH ONCE IN THE EVENING What changed: See the new instructions.   solifenacin 5 MG tablet Commonly known as: VESICARE Take 5 mg by mouth daily. Pt has not started as of 07/04/2020   traMADol 50 MG tablet Commonly known as: ULTRAM Take 1-2 tablets (50-100 mg total) by mouth every 6 (six) hours as needed for moderate pain.   TRIPLE ANTIBIOTIC EX Apply 1 application topically daily as needed (wound care).   Triple Antibiotic Pain Relief 1 % Oint Generic drug: Neomy-Bacit-Polymyx-Pramoxine Apply 1 application topically as needed (wound care).   trolamine salicylate 10 % cream Commonly known as: ASPERCREME Apply 1 application topically at bedtime. Applied to feet at night   valsartan 160 MG tablet Commonly known as: DIOVAN TAKE 1 TABLET BY MOUTH EVERY DAY What changed:   how much to take  how to take this  when to take this  additional instructions            Discharge Care  Instructions  (From admission, onward)         Start     Ordered   07/14/20 0000  Weight bearing as tolerated        07/14/20 0747   07/14/20 0000  Change dressing       Comments: You have an adhesive waterproof bandage over the incision. Leave this in place until your first follow-up appointment. Once you remove this you will not need to place another bandage.   07/14/20 0747          Follow-up Information    Gaynelle Arabian, MD. Schedule an appointment as soon as possible for a visit on 07/26/2020.   Specialty: Orthopedic Surgery Contact information: [address] [place] [address] [phone]               Signed: Theresa Duty, PA-C Orthopedic Surgery 07/18/2020, 3:31 PM

## 2020-07-29 ENCOUNTER — Other Ambulatory Visit: Payer: Self-pay | Admitting: Family Medicine

## 2020-09-14 MED ORDER — SOLIFENACIN SUCCINATE 5 MG PO TABS: 5.0000 mg | ORAL_TABLET | Freq: Every day | ORAL | 3 refills | Status: DC

## 2020-09-19 ENCOUNTER — Other Ambulatory Visit: Payer: Self-pay | Admitting: Family Medicine

## 2020-09-19 DIAGNOSIS — M5440 Lumbago with sciatica, unspecified side: Secondary | ICD-10-CM

## 2020-09-19 DIAGNOSIS — G8929 Other chronic pain: Secondary | ICD-10-CM

## 2020-09-19 DIAGNOSIS — Z9889 Other specified postprocedural states: Secondary | ICD-10-CM

## 2020-09-26 ENCOUNTER — Other Ambulatory Visit: Payer: Self-pay | Admitting: Family Medicine

## 2020-09-26 DIAGNOSIS — M5440 Lumbago with sciatica, unspecified side: Secondary | ICD-10-CM

## 2020-09-26 DIAGNOSIS — M1611 Unilateral primary osteoarthritis, right hip: Secondary | ICD-10-CM

## 2020-09-26 DIAGNOSIS — G8929 Other chronic pain: Secondary | ICD-10-CM

## 2020-09-26 DIAGNOSIS — Z9889 Other specified postprocedural states: Secondary | ICD-10-CM

## 2020-10-05 ENCOUNTER — Other Ambulatory Visit: Payer: Self-pay

## 2020-10-05 ENCOUNTER — Ambulatory Visit: Payer: Federal, State, Local not specified - PPO | Admitting: Dermatology

## 2020-10-05 DIAGNOSIS — D489 Neoplasm of uncertain behavior, unspecified: Secondary | ICD-10-CM

## 2020-10-05 DIAGNOSIS — Z86018 Personal history of other benign neoplasm: Secondary | ICD-10-CM | POA: Diagnosis not present

## 2020-10-05 NOTE — Progress Notes (Signed)
   New Patient Visit  Subjective  Kristine [name] is a 80 y.o. female who presents for the following: Spot Check (Pt has a spot above left eyebrow that she believes may be a lipoma. Pt has hx of lipomas. She would like to discuss removal. ).  It has slowly grown larger.  She has other similar type lumps on her arms and scalp.    Objective  Well appearing patient in no apparent distress; mood and affect are within normal limits.  A focused examination was performed including face. Relevant physical exam findings are noted in the Assessment and Plan.  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 Nehemiah Massed came in for a second opinion. Recommend seeing an ENT for removal due to location and proximity to temporal nerve, since lipomas tend to be deep.  Return if symptoms worsen or fail to improve.    I, Harriett Sine, CMA, am acting as scribe for Brendolyn Patty, MD.  Documentation: I have reviewed the above documentation for accuracy and completeness, and I agree with the above.  Brendolyn Patty MD

## 2020-10-06 ENCOUNTER — Ambulatory Visit: Payer: Federal, State, Local not specified - PPO | Admitting: Dermatology

## 2020-10-17 ENCOUNTER — Other Ambulatory Visit: Payer: Self-pay | Admitting: Orthopedic Surgery

## 2020-10-17 DIAGNOSIS — M5416 Radiculopathy, lumbar region: Secondary | ICD-10-CM

## 2020-10-28 ENCOUNTER — Other Ambulatory Visit: Payer: Self-pay

## 2020-10-28 ENCOUNTER — Ambulatory Visit
Admission: RE | Admit: 2020-10-28 | Discharge: 2020-10-28 | Disposition: A | Discharge status: Home / Self Care | Payer: Federal, State, Local not specified - PPO | Source: Ambulatory Visit | Attending: Orthopedic Surgery | Admitting: Orthopedic Surgery

## 2020-10-28 DIAGNOSIS — M5416 Radiculopathy, lumbar region: Secondary | ICD-10-CM

## 2020-11-07 ENCOUNTER — Other Ambulatory Visit: Payer: Self-pay | Admitting: Orthopedic Surgery

## 2020-11-07 ENCOUNTER — Other Ambulatory Visit: Payer: Self-pay | Admitting: Internal Medicine

## 2020-11-07 DIAGNOSIS — M5416 Radiculopathy, lumbar region: Secondary | ICD-10-CM

## 2020-11-23 ENCOUNTER — Ambulatory Visit
Admission: RE | Admit: 2020-11-23 | Discharge: 2020-11-23 | Disposition: A | Discharge status: Home / Self Care | Payer: Federal, State, Local not specified - PPO | Source: Ambulatory Visit | Attending: Orthopedic Surgery | Admitting: Orthopedic Surgery

## 2020-11-23 ENCOUNTER — Other Ambulatory Visit: Payer: Self-pay

## 2020-11-23 DIAGNOSIS — M5416 Radiculopathy, lumbar region: Secondary | ICD-10-CM

## 2020-12-01 ENCOUNTER — Other Ambulatory Visit: Payer: Self-pay | Admitting: Orthopedic Surgery

## 2020-12-07 ENCOUNTER — Other Ambulatory Visit: Payer: Self-pay | Admitting: Family Medicine

## 2020-12-13 ENCOUNTER — Telehealth: Payer: Self-pay | Admitting: Family Medicine

## 2020-12-13 DIAGNOSIS — E7800 Pure hypercholesterolemia, unspecified: Secondary | ICD-10-CM

## 2020-12-13 DIAGNOSIS — E039 Hypothyroidism, unspecified: Secondary | ICD-10-CM

## 2020-12-13 DIAGNOSIS — R7303 Prediabetes: Secondary | ICD-10-CM

## 2020-12-13 NOTE — Telephone Encounter (Signed)
-----   Message from Cloyd Stagers, RT sent at 12/01/2020  5:00 PM EDT ----- Regarding: Lab Orders for Wednesday 7.20.2022 Please place lab orders for Wednesday 7.20.2022, office visit for 6 month f/u on Tuesday 7.26.2022 Thank you, Dyke Maes RT(R)

## 2020-12-14 ENCOUNTER — Other Ambulatory Visit (INDEPENDENT_AMBULATORY_CARE_PROVIDER_SITE_OTHER): Payer: Federal, State, Local not specified - PPO

## 2020-12-14 ENCOUNTER — Other Ambulatory Visit: Payer: Self-pay

## 2020-12-14 DIAGNOSIS — E7800 Pure hypercholesterolemia, unspecified: Secondary | ICD-10-CM

## 2020-12-14 DIAGNOSIS — R7303 Prediabetes: Secondary | ICD-10-CM | POA: Diagnosis not present

## 2020-12-14 DIAGNOSIS — E039 Hypothyroidism, unspecified: Secondary | ICD-10-CM

## 2020-12-14 LAB — LIPID PANEL
Cholesterol: 193 mg/dL (ref 0–200)
HDL: 64 mg/dL (ref >39.00)
LDL Cholesterol: 94 mg/dL (ref 0–99)
NonHDL: 128.5
Total CHOL/HDL Ratio: 3
Triglycerides: 171 mg/dL — ABNORMAL HIGH (ref 0.0–149.0)
VLDL: 34.2 mg/dL (ref 0.0–40.0)

## 2020-12-14 LAB — T4, FREE: Free T4: 1.24 ng/dL (ref 0.60–1.60)

## 2020-12-14 LAB — TSH: TSH: 2.57 u[IU]/mL (ref 0.35–5.50)

## 2020-12-14 LAB — HEMOGLOBIN A1C: Hgb A1c MFr Bld: 6.2 % (ref 4.6–6.5)

## 2020-12-14 LAB — T3, FREE: T3, Free: 2.8 pg/mL (ref 2.3–4.2)

## 2020-12-14 NOTE — Progress Notes (Signed)
No critical labs need to be addressed urgently. We will discuss labs in detail at upcoming office visit.   

## 2020-12-15 ENCOUNTER — Telehealth: Payer: Self-pay | Admitting: Family Medicine

## 2020-12-15 NOTE — Telephone Encounter (Signed)
Called pt to reschedule appt. Pt is concerned about basic metabolic test. Pt wanted provider to review the test that came back and see it anything is missing. Pt asked if she could be called or send the message through my chart.

## 2020-12-20 ENCOUNTER — Ambulatory Visit: Payer: Federal, State, Local not specified - PPO | Admitting: Family Medicine

## 2020-12-21 ENCOUNTER — Other Ambulatory Visit: Payer: Self-pay | Admitting: *Deleted

## 2020-12-21 DIAGNOSIS — M1120 Other chondrocalcinosis, unspecified site: Secondary | ICD-10-CM | POA: Insufficient documentation

## 2020-12-28 ENCOUNTER — Ambulatory Visit
Admission: RE | Admit: 2020-12-28 | Discharge: 2020-12-28 | Disposition: A | Discharge status: Home / Self Care | Payer: Federal, State, Local not specified - PPO | Source: Ambulatory Visit | Attending: Otolaryngology | Admitting: Otolaryngology

## 2020-12-28 ENCOUNTER — Other Ambulatory Visit: Payer: Self-pay

## 2020-12-28 DIAGNOSIS — D333 Benign neoplasm of cranial nerves: Secondary | ICD-10-CM

## 2020-12-28 MED ORDER — GADOBENATE DIMEGLUMINE 529 MG/ML IV SOLN
20.0000 mL | Freq: Once | INTRAVENOUS | Status: AC | PRN
  Administered 2020-12-28: 20 mL via INTRAVENOUS

## 2021-01-03 ENCOUNTER — Encounter: Payer: Self-pay | Admitting: Family Medicine

## 2021-01-03 ENCOUNTER — Other Ambulatory Visit: Payer: Self-pay

## 2021-01-03 ENCOUNTER — Ambulatory Visit: Payer: Federal, State, Local not specified - PPO | Admitting: Family Medicine

## 2021-01-03 VITALS — BP 128/64 | HR 93 | Temp 97.9°F | Ht 65.5 in | Wt 220.0 lb

## 2021-01-03 DIAGNOSIS — I10 Essential (primary) hypertension: Secondary | ICD-10-CM | POA: Diagnosis not present

## 2021-01-03 DIAGNOSIS — Z01818 Encounter for other preprocedural examination: Secondary | ICD-10-CM | POA: Diagnosis not present

## 2021-01-03 DIAGNOSIS — E7800 Pure hypercholesterolemia, unspecified: Secondary | ICD-10-CM | POA: Diagnosis not present

## 2021-01-03 DIAGNOSIS — R7303 Prediabetes: Secondary | ICD-10-CM

## 2021-01-03 DIAGNOSIS — E039 Hypothyroidism, unspecified: Secondary | ICD-10-CM

## 2021-01-03 DIAGNOSIS — Z6836 Body mass index (BMI) 36.0-36.9, adult: Secondary | ICD-10-CM

## 2021-01-03 NOTE — Progress Notes (Signed)
Patient ID: Kristine [name], female    DOB: 1941-02-14, 80 y.o.   MRN: [id number]  This visit was conducted in person.  BP 128/64   Pulse 93   Temp 97.9 F (36.6 C) (Temporal)   Ht 5' 5.5" (1.664 m)   Wt 220 lb (99.8 kg)   SpO2 97%   BMI 36.05 kg/m    CC: Chief Complaint  Patient presents with   Follow-up    6 month      Subjective:   HPI: NILA STAIRS is a 80 y.o. female presenting on 01/03/2021 for Follow-up (6 month /)  Chronic back pain She has upcoming 2 day lateral fusion/allograph.  Goal of surgery is improved pain as steroid injections and past surgery have not helped  Dr. Lynann Bologna.   She has lost 16 lbs in last 6 months She has decreased po intake. Wt Readings from Last 3 Encounters:  01/03/21 220 lb (99.8 kg)  07/13/20 236 lb 8 oz (107.3 kg)  06/21/20 237 lb 4 oz (107.6 kg)     She has good cardiac fitness but is limited due to back and joints.  No SOB, no CP.  4 and 10 METs = Can do heavy work around the house, such as scrubbing floors or lifting or moving heavy furniture, or climbing two flights of stairs (between)   Elevated Cholesterol: LDL at goal on 40 mg simvastatin. Lab Results  Component Value Date   CHOL 193 12/14/2020   HDL 64.00 12/14/2020   LDLCALC 94 12/14/2020   LDLDIRECT 105.0 06/14/2020   TRIG 171.0 (H) 12/14/2020   CHOLHDL 3 12/14/2020    Using medications without problems: Muscle aches:  Diet compliance: Exercise: Other complaints:  Hypertension:   At goal on diovan HCTZ BP Readings from Last 3 Encounters:  01/03/21 128/64  07/14/20 120/68  07/04/20 (!) 156/53  Using medication without problems or lightheadedness:  none Chest pain with exertion:none Edema:none Short of breath:none Average home BPs: Other issues:  Prediabetes  Lab Results  Component Value Date   HGBA1C 6.2 12/14/2020   Hypothyroidism stable on levothyroxine.   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 No SE.     Relevant past medical,  surgical, family and social history reviewed and updated as indicated. Interim medical history since our last visit reviewed. Allergies and medications reviewed and updated. Outpatient Medications Prior to Visit  Medication Sig Dispense Refill   amoxicillin (AMOXIL) 500 MG capsule Take 500 mg by mouth See admin instructions. Take 4 capsules (2000 mg) by mouth 1 hour prior to dental procedures     colchicine 0.6 MG tablet Take 1 tablet (0.6 mg total) by mouth 2 (two) times daily as needed (for gout flare). 90 tablet 1   fexofenadine (ALLEGRA) 180 MG tablet Take 180 mg by mouth daily.     fluticasone (FLONASE) 50 MCG/ACT nasal spray Place 2 sprays into both nostrils daily as needed (congestion/allergies.). Reported on [id number]     folic acid (FOLVITE) 1 MG tablet Take 1 mg by mouth daily.     gabapentin (NEURONTIN) 600 MG tablet Take 600 mg by mouth 4 (four) times daily.      hydrochlorothiazide (HYDRODIURIL) 25 MG tablet TAKE 1 TABLET BY MOUTH EVERY DAY 90 tablet 3   HYDROcodone-acetaminophen (NORCO/VICODIN) 5-325 MG tablet Take 1-2 tablets by mouth every 6 (six) hours as needed for moderate pain or severe pain. 42 tablet 0   levothyroxine (SYNTHROID) 75 MCG tablet TAKE 1 TABLET  DAILY BEFORE BREAKFAST (Patient taking differently: Take 75 mcg by mouth daily before breakfast. TAKE 1 TABLET DAILY BEFORE BREAKFAST) 90 tablet 3   Lidocaine HCl 4 % CREA Apply 1 application topically 3 (three) times daily as needed (pain.).     methocarbamol (ROBAXIN) 500 MG tablet Take 1 tablet (500 mg total) by mouth every 6 (six) hours as needed for muscle spasms. 40 tablet 0   methotrexate (RHEUMATREX) 2.5 MG tablet Take 10 mg by mouth every Thursday.     montelukast (SINGULAIR) 10 MG tablet TAKE 1 TABLET AT BEDTIME (Patient taking differently: Take 10 mg by mouth at bedtime.) 90 tablet 0   Neomy-Bacit-Polymyx-Pramoxine (TRIPLE ANTIBIOTIC PAIN RELIEF) 1 % OINT Apply 1 application topically as needed (wound care).      Omega-3 Fatty Acids (FISH OIL) 1200 MG CAPS Take 3,600 mg by mouth 2 (two) times a day.     omeprazole (PRILOSEC) 20 MG capsule Take 20 mg by mouth daily.     polyethylene glycol (MIRALAX / GLYCOLAX) packet Take 17 g by mouth daily as needed (constipation.).     PREMARIN 0.625 MG tablet TAKE 1 TABLET DAILY (Patient taking differently: Take 0.625 mg by mouth every evening.) 90 tablet 0   simvastatin (ZOCOR) 40 MG tablet TAKE 1 TABLET ('40MG'$ ) BY MOUTH ONCE IN THE EVENING 90 tablet 1   solifenacin (VESICARE) 5 MG tablet Take 1 tablet (5 mg total) by mouth daily. Pt has not started as of 07/04/2020 90 tablet 3   traMADol (ULTRAM) 50 MG tablet Take 1-2 tablets (50-100 mg total) by mouth every 6 (six) hours as needed for moderate pain. 40 tablet 0   trolamine salicylate (ASPERCREME) 10 % cream Apply 1 application topically at bedtime. Applied to feet at night     valsartan (DIOVAN) 160 MG tablet TAKE 1 TABLET BY MOUTH EVERY DAY 90 tablet 3   No facility-administered medications prior to visit.     Per HPI unless specifically indicated in ROS section below Review of Systems  Constitutional:  Negative for fatigue and fever.  HENT:  Negative for ear pain.   Eyes:  Negative for pain.  Respiratory:  Negative for chest tightness and shortness of breath.   Cardiovascular:  Negative for chest pain, palpitations and leg swelling.  Gastrointestinal:  Negative for abdominal pain.  Genitourinary:  Negative for dysuria.  Objective:  BP 128/64   Pulse 93   Temp 97.9 F (36.6 C) (Temporal)   Ht 5' 5.5" (1.664 m)   Wt 220 lb (99.8 kg)   SpO2 97%   BMI 36.05 kg/m   Wt Readings from Last 3 Encounters:  01/03/21 220 lb (99.8 kg)  07/13/20 236 lb 8 oz (107.3 kg)  06/21/20 237 lb 4 oz (107.6 kg)      Physical Exam Constitutional:      General: She is not in acute distress.    Appearance: Normal appearance. She is well-developed. She is obese. She is not ill-appearing or toxic-appearing.  HENT:     Head:  Normocephalic.     Right Ear: Hearing, tympanic membrane, ear canal and external ear normal. Tympanic membrane is not erythematous, retracted or bulging.     Left Ear: Hearing, tympanic membrane, ear canal and external ear normal. Tympanic membrane is not erythematous, retracted or bulging.     Nose: No mucosal edema or rhinorrhea.     Right Sinus: No maxillary sinus tenderness or frontal sinus tenderness.     Left Sinus: No maxillary sinus  tenderness or frontal sinus tenderness.     Mouth/Throat:     Pharynx: Uvula midline.  Eyes:     General: Lids are normal. Lids are everted, no foreign bodies appreciated.     Conjunctiva/sclera: Conjunctivae normal.     Pupils: Pupils are equal, round, and reactive to light.  Neck:     Thyroid: No thyroid mass or thyromegaly.     Vascular: No carotid bruit.     Trachea: Trachea normal.  Cardiovascular:     Rate and Rhythm: Normal rate and regular rhythm.     Pulses: Normal pulses.     Heart sounds: Normal heart sounds, S1 normal and S2 normal. No murmur heard.   No friction rub. No gallop.  Pulmonary:     Effort: Pulmonary effort is normal. No tachypnea or respiratory distress.     Breath sounds: Normal breath sounds. No decreased breath sounds, wheezing, rhonchi or rales.  Abdominal:     General: Bowel sounds are normal.     Palpations: Abdomen is soft.     Tenderness: There is no abdominal tenderness.  Musculoskeletal:     Cervical back: Normal range of motion and neck supple.  Skin:    General: Skin is warm and dry.     Findings: No rash.  Neurological:     Mental Status: She is alert.  Psychiatric:        Mood and Affect: Mood is not anxious or depressed.        Speech: Speech normal.        Behavior: Behavior normal. Behavior is cooperative.        Thought Content: Thought content normal.        Judgment: Judgment normal.      Results for orders placed or performed in visit on 12/14/20  Lipid panel  Result Value Ref Range    Cholesterol 193 0 - 200 mg/dL   Triglycerides 171.0 (H) 0.0 - 149.0 mg/dL   HDL 64.00 >39.00 mg/dL   VLDL 34.2 0.0 - 40.0 mg/dL   LDL Cholesterol 94 0 - 99 mg/dL   Total CHOL/HDL Ratio 3    NonHDL 128.50   Hemoglobin A1c  Result Value Ref Range   Hgb A1c MFr Bld 6.2 4.6 - 6.5 %  TSH  Result Value Ref Range   TSH 2.57 0.35 - 5.50 uIU/mL  T4, free  Result Value Ref Range   Free T4 1.24 0.60 - 1.60 ng/dL  T3, free  Result Value Ref Range   T3, Free 2.8 2.3 - 4.2 pg/mL    This visit occurred during the SARS-CoV-2 public health emergency.  Safety protocols were in place, including screening questions prior to the visit, additional usage of staff PPE, and extensive cleaning of exam room while observing appropriate contact time as indicated for disinfecting solutions.   COVID 19 screen:  No recent travel or known exposure to COVID19 The patient denies respiratory symptoms of COVID 19 at this time. The importance of social distancing was discussed today.   Assessment and Plan    Problem List Items Addressed This Visit     Essential hypertension, benign (Chronic)    Stable, chronic.  Continue current medication.    Diovan HCTZ        Hypothyroidism (Chronic)    Stable, chronic.  Continue current medication.   Levothyroxine 75 mcg daily      Prediabetes (Chronic)   Pure hypercholesterolemia - Primary (Chronic)    Stable, chronic.  Continue current  medication.   LDL at goal on 40 mg simvastatin.      Class 2 severe obesity due to excess calories with serious comorbidity and body mass index (BMI) of 36.0 to 36.9 in adult [facility])    Body mass index is 36.05 kg/m.    Can look into Wegovy  (semaglutide) after surgery to continue working on weight loss.       Preoperative clearance    She has good cardiac fitness but is limited due to back and joints.  No SOB, no CP.  4 and 10 METs = Can do heavy work around the house, such as scrubbing floors or lifting or moving  heavy furniture, or climbing two flights of stairs (between)  Cleared for upcoming surgery with low to moderate  Risk.        Eliezer Lofts, MD

## 2021-01-03 NOTE — Patient Instructions (Addendum)
Can look into Wegovy  (semaglutide) after surgery to continue working on weight loss.

## 2021-01-05 NOTE — Progress Notes (Signed)
Surgical Instructions    Your procedure is scheduled on 01/11/21.  Report to [facility] Main Entrance "A" at 05:30 A.M., then check in with the Admitting office.  Call this number if you have problems the morning of surgery:  [phone]   If you have any questions prior to your surgery date call [phone]: Open Monday-Friday 8am-4pm    Remember:  Do not eat after midnight the night before your surgery  You may drink clear liquids until 04:30am the morning of your surgery.   Clear liquids allowed are: Water, Non-Citrus Juices (without pulp), Carbonated Beverages, Clear Tea, Black Coffee Only, and Gatorade  Patient Instructions  The night before surgery:  No food after midnight. ONLY clear liquids after midnight  The day of surgery (if you do NOT have diabetes):  Drink ONE (1) Pre-Surgery Clear Ensure by 4:30am the morning of surgery. Drink in one sitting. Do not sip.  This drink was given to you during your hospital  pre-op appointment visit. Nothing else to drink after completing the  Pre-Surgery Clear Ensure.          If you have questions, please contact your surgeon's office.     Take these medicines the morning of surgery with A SIP OF WATER  Colchicine if needed fluticasone (FLONASE) if needed gabapentin (NEURONTIN) HYDROcodone-acetaminophen (NORCO/VICODIN) if needed levothyroxine (SYNTHROID)  methocarbamol (ROBAXIN) if needed omeprazole (PRILOSEC) traMADol (ULTRAM) if needed      As of today, STOP taking any Aspirin (unless otherwise instructed by your surgeon) Aleve, Naproxen, Ibuprofen, Motrin, Advil, Goody's, BC's, all herbal medications, fish oil, and all vitamins.          Do not wear jewelry or makeup Do not wear lotions, powders, perfumes/colognes, or deodorant. Do not shave 48 hours prior to surgery.   Do not bring valuables to the hospital.  DO Not wear nail polish, gel polish, artificial nails, or any other type of covering on natural nails   including finger and toenails. If patients have artificial nails, gel coating, etc. that need to be removed by a nail salon please have this removed prior to surgery or surgery may need to be canceled/delayed if the surgeon/ anesthesia feels like the patient is unable to be adequately monitored.             [facility] is not responsible for any belongings or valuables.  Do NOT Smoke (Tobacco/Vaping) or drink Alcohol 24 hours prior to your procedure If you use a CPAP at night, you may bring all equipment for your overnight stay.   Contacts, glasses, dentures or bridgework may not be worn into surgery, please bring cases for these belongings   For patients admitted to the hospital, discharge time will be determined by your treatment team.   Patients discharged the day of surgery will not be allowed to drive home, and someone needs to stay with them for 24 hours.  ONLY 1 SUPPORT PERSON MAY BE PRESENT WHILE YOU ARE IN SURGERY. IF YOU ARE TO BE ADMITTED ONCE YOU ARE IN YOUR ROOM YOU WILL BE ALLOWED TWO (2) VISITORS.  Minor children may have two parents present. Special consideration for safety and communication needs will be reviewed on a case by case basis.  Special instructions:    Oral Hygiene is also important to reduce your risk of infection.  Remember - BRUSH YOUR TEETH THE MORNING OF SURGERY WITH YOUR REGULAR TOOTHPASTE   [place]- Preparing For Surgery  Before surgery, you can play an  important role. Because skin is not sterile, your skin needs to be as free of germs as possible. You can reduce the number of germs on your skin by washing with CHG (chlorahexidine gluconate) Soap before surgery.  CHG is an antiseptic cleaner which kills germs and bonds with the skin to continue killing germs even after washing.     Please do not use if you have an allergy to CHG or antibacterial soaps. If your skin becomes reddened/irritated stop using the CHG.  Do not shave (including legs and  underarms) for at least 48 hours prior to first CHG shower. It is OK to shave your face.  Please follow these instructions carefully.     Shower the NIGHT BEFORE SURGERY and the MORNING OF SURGERY with CHG Soap.   If you chose to wash your hair, wash your hair first as usual with your normal shampoo. After you shampoo, rinse your hair and body thoroughly to remove the shampoo.  Then [employer] and genitals (private parts) with your normal soap and rinse thoroughly to remove soap.  After that Use CHG Soap as you would any other liquid soap. You can apply CHG directly to the skin and wash gently with a scrungie or a clean washcloth.   Apply the CHG Soap to your body ONLY FROM THE NECK DOWN.  Do not use on open wounds or open sores. Avoid contact with your eyes, ears, mouth and genitals (private parts). Wash Face and genitals (private parts)  with your normal soap.   Wash thoroughly, paying special attention to the area where your surgery will be performed.  Thoroughly rinse your body with warm water from the neck down.  DO NOT shower/wash with your normal soap after using and rinsing off the CHG Soap.  Pat yourself dry with a CLEAN TOWEL.  Wear CLEAN PAJAMAS to bed the night before surgery  Place CLEAN SHEETS on your bed the night before your surgery  DO NOT SLEEP WITH PETS.   Day of Surgery: Take a shower with CHG soap. Wear Clean/Comfortable clothing the morning of surgery Do not apply any deodorants/lotions.   Remember to brush your teeth WITH YOUR REGULAR TOOTHPASTE.   Please read over the following fact sheets that you were given.

## 2021-01-06 ENCOUNTER — Other Ambulatory Visit: Payer: Self-pay

## 2021-01-06 ENCOUNTER — Encounter (HOSPITAL_COMMUNITY)
Admission: RE | Admit: 2021-01-06 | Discharge: 2021-01-06 | Disposition: A | Discharge status: Home / Self Care | Payer: Federal, State, Local not specified - PPO | Source: Ambulatory Visit | Attending: Orthopedic Surgery | Admitting: Orthopedic Surgery

## 2021-01-06 ENCOUNTER — Encounter (HOSPITAL_COMMUNITY): Payer: Self-pay

## 2021-01-06 DIAGNOSIS — Z01812 Encounter for preprocedural laboratory examination: Secondary | ICD-10-CM | POA: Insufficient documentation

## 2021-01-06 LAB — COMPREHENSIVE METABOLIC PANEL
ALT: 13 U/L (ref 0–44)
AST: 14 U/L — ABNORMAL LOW (ref 15–41)
Albumin: 3.9 g/dL (ref 3.5–5.0)
Alkaline Phosphatase: 51 U/L (ref 38–126)
Anion gap: 8 (ref 5–15)
BUN: 17 mg/dL (ref 8–23)
CO2: 26 mmol/L (ref 22–32)
Calcium: 9.2 mg/dL (ref 8.9–10.3)
Chloride: 103 mmol/L (ref 98–111)
Creatinine, Ser: 0.73 mg/dL (ref 0.44–1.00)
GFR, Estimated: >60 mL/min (ref >60)
Glucose, Bld: 101 mg/dL — ABNORMAL HIGH (ref 70–99)
Potassium: 3.8 mmol/L (ref 3.5–5.1)
Sodium: 137 mmol/L (ref 135–145)
Total Bilirubin: 0.9 mg/dL (ref 0.3–1.2)
Total Protein: 6.4 g/dL — ABNORMAL LOW (ref 6.5–8.1)

## 2021-01-06 LAB — CBC WITH DIFFERENTIAL/PLATELET
Abs Immature Granulocytes: 0.04 10*3/uL (ref 0.00–0.07)
Basophils Absolute: 0.1 10*3/uL (ref 0.0–0.1)
Basophils Relative: 1 %
Eosinophils Absolute: 0.2 10*3/uL (ref 0.0–0.5)
Eosinophils Relative: 2 %
HCT: 39.4 % (ref 36.0–46.0)
Hemoglobin: 13.2 g/dL (ref 12.0–15.0)
Immature Granulocytes: 1 %
Lymphocytes Relative: 29 %
Lymphs Abs: 2.3 10*3/uL (ref 0.7–4.0)
MCH: 29.4 pg (ref 26.0–34.0)
MCHC: 33.5 g/dL (ref 30.0–36.0)
MCV: 87.8 fL (ref 80.0–100.0)
Monocytes Absolute: 0.4 10*3/uL (ref 0.1–1.0)
Monocytes Relative: 5 %
Neutro Abs: 5 10*3/uL (ref 1.7–7.7)
Neutrophils Relative %: 62 %
Platelets: 270 10*3/uL (ref 150–400)
RBC: 4.49 MIL/uL (ref 3.87–5.11)
RDW: 14.2 % (ref 11.5–15.5)
WBC: 8.1 10*3/uL (ref 4.0–10.5)
nRBC: 0 % (ref 0.0–0.2)

## 2021-01-06 LAB — PROTIME-INR
INR: 1 (ref 0.8–1.2)
Prothrombin Time: 13.4 seconds (ref 11.4–15.2)

## 2021-01-06 LAB — APTT: aPTT: 28 seconds (ref 24–36)

## 2021-01-06 LAB — SURGICAL PCR SCREEN
MRSA, PCR: NEGATIVE
Staphylococcus aureus: NEGATIVE

## 2021-01-06 NOTE — Progress Notes (Signed)
PCP - Eliezer Lofts Cardiologist - denies Rheumatology: [facility]   PPM/ICD - denies   Chest x-ray - n/a EKG - 05/31/20 Stress Test - denies ECHO - denies Cardiac Cath - denies  Sleep Study - denies   No Diabetes  As of today, STOP taking any Aspirin (unless otherwise instructed by your surgeon) Aleve, Naproxen, Ibuprofen, Motrin, Advil, Goody's, BC's, all herbal medications, fish oil, and all vitamins. Instructed patient to stop taking methotrexate 3 days prior to surgery.  ERAS Protcol -yes PRE-SURGERY Ensure or G2- yes. Ensure given  COVID TEST- instructed patient to go to [facility] [place] location on 01/09/21   Anesthesia review: no  Patient denies shortness of breath, fever, cough and chest pain at PAT appointment   All instructions explained to the patient, with a verbal understanding of the material. Patient agrees to go over the instructions while at home for a better understanding. Patient also instructed to self quarantine after being tested for COVID-19. The opportunity to ask questions was provided.

## 2021-01-09 ENCOUNTER — Other Ambulatory Visit: Payer: Self-pay | Admitting: Orthopedic Surgery

## 2021-01-10 LAB — SARS CORONAVIRUS 2 (TAT 6-24 HRS): SARS Coronavirus 2: NEGATIVE

## 2021-01-11 ENCOUNTER — Other Ambulatory Visit: Payer: Self-pay

## 2021-01-11 ENCOUNTER — Encounter (HOSPITAL_COMMUNITY): Payer: Self-pay | Admitting: Orthopedic Surgery

## 2021-01-11 ENCOUNTER — Inpatient Hospital Stay (HOSPITAL_COMMUNITY): Payer: Medicare Other | Admitting: Certified Registered Nurse Anesthetist

## 2021-01-11 ENCOUNTER — Inpatient Hospital Stay (HOSPITAL_COMMUNITY)
Admission: RE | Admit: 2021-01-11 | Discharge: 2021-01-13 | DRG: 455 | Disposition: A | Discharge status: Home / Self Care | Payer: Medicare Other | Attending: Orthopedic Surgery | Admitting: Orthopedic Surgery

## 2021-01-11 ENCOUNTER — Inpatient Hospital Stay (HOSPITAL_COMMUNITY)
Admission: RE | Disposition: A | Discharge status: Home / Self Care | Payer: Self-pay | Source: Home / Self Care | Attending: Orthopedic Surgery

## 2021-01-11 ENCOUNTER — Inpatient Hospital Stay (HOSPITAL_COMMUNITY): Payer: Medicare Other

## 2021-01-11 DIAGNOSIS — Z96641 Presence of right artificial hip joint: Secondary | ICD-10-CM | POA: Diagnosis present

## 2021-01-11 DIAGNOSIS — M1990 Unspecified osteoarthritis, unspecified site: Secondary | ICD-10-CM | POA: Diagnosis present

## 2021-01-11 DIAGNOSIS — I10 Essential (primary) hypertension: Secondary | ICD-10-CM | POA: Diagnosis present

## 2021-01-11 DIAGNOSIS — Z833 Family history of diabetes mellitus: Secondary | ICD-10-CM

## 2021-01-11 DIAGNOSIS — Z79899 Other long term (current) drug therapy: Secondary | ICD-10-CM | POA: Diagnosis not present

## 2021-01-11 DIAGNOSIS — G629 Polyneuropathy, unspecified: Secondary | ICD-10-CM | POA: Diagnosis present

## 2021-01-11 DIAGNOSIS — E039 Hypothyroidism, unspecified: Secondary | ICD-10-CM | POA: Diagnosis present

## 2021-01-11 DIAGNOSIS — Z20822 Contact with and (suspected) exposure to covid-19: Secondary | ICD-10-CM | POA: Diagnosis present

## 2021-01-11 DIAGNOSIS — E7800 Pure hypercholesterolemia, unspecified: Secondary | ICD-10-CM | POA: Diagnosis present

## 2021-01-11 DIAGNOSIS — K219 Gastro-esophageal reflux disease without esophagitis: Secondary | ICD-10-CM | POA: Diagnosis present

## 2021-01-11 DIAGNOSIS — Z419 Encounter for procedure for purposes other than remedying health state, unspecified: Secondary | ICD-10-CM

## 2021-01-11 DIAGNOSIS — Z8249 Family history of ischemic heart disease and other diseases of the circulatory system: Secondary | ICD-10-CM

## 2021-01-11 DIAGNOSIS — M4800 Spinal stenosis, site unspecified: Secondary | ICD-10-CM | POA: Diagnosis present

## 2021-01-11 DIAGNOSIS — Z841 Family history of disorders of kidney and ureter: Secondary | ICD-10-CM

## 2021-01-11 DIAGNOSIS — Z79890 Hormone replacement therapy (postmenopausal): Secondary | ICD-10-CM | POA: Diagnosis not present

## 2021-01-11 DIAGNOSIS — M48061 Spinal stenosis, lumbar region without neurogenic claudication: Principal | ICD-10-CM | POA: Diagnosis present

## 2021-01-11 DIAGNOSIS — Z83438 Family history of other disorder of lipoprotein metabolism and other lipidemia: Secondary | ICD-10-CM | POA: Diagnosis not present

## 2021-01-11 DIAGNOSIS — M5416 Radiculopathy, lumbar region: Secondary | ICD-10-CM | POA: Diagnosis present

## 2021-01-11 DIAGNOSIS — Z807 Family history of other malignant neoplasms of lymphoid, hematopoietic and related tissues: Secondary | ICD-10-CM

## 2021-01-11 HISTORY — PX: ANTERIOR LAT LUMBAR FUSION: SHX1168

## 2021-01-11 LAB — COMPREHENSIVE METABOLIC PANEL
ALT: 13 U/L (ref 0–44)
AST: 20 U/L (ref 15–41)
Albumin: 3.6 g/dL (ref 3.5–5.0)
Alkaline Phosphatase: 40 U/L (ref 38–126)
Anion gap: 11 (ref 5–15)
BUN: 15 mg/dL (ref 8–23)
CO2: 21 mmol/L — ABNORMAL LOW (ref 22–32)
Calcium: 8.7 mg/dL — ABNORMAL LOW (ref 8.9–10.3)
Chloride: 102 mmol/L (ref 98–111)
Creatinine, Ser: 0.72 mg/dL (ref 0.44–1.00)
GFR, Estimated: >60 mL/min (ref >60)
Glucose, Bld: 216 mg/dL — ABNORMAL HIGH (ref 70–99)
Potassium: 3.8 mmol/L (ref 3.5–5.1)
Sodium: 134 mmol/L — ABNORMAL LOW (ref 135–145)
Total Bilirubin: 0.9 mg/dL (ref 0.3–1.2)
Total Protein: 5.6 g/dL — ABNORMAL LOW (ref 6.5–8.1)

## 2021-01-11 LAB — URINALYSIS, ROUTINE W REFLEX MICROSCOPIC
Bilirubin Urine: NEGATIVE
Glucose, UA: NEGATIVE mg/dL
Ketones, ur: NEGATIVE mg/dL
Leukocytes,Ua: NEGATIVE
Nitrite: NEGATIVE
Protein, ur: NEGATIVE mg/dL
Specific Gravity, Urine: 1.011 (ref 1.005–1.030)
pH: 5 (ref 5.0–8.0)

## 2021-01-11 LAB — CBC WITH DIFFERENTIAL/PLATELET
Abs Immature Granulocytes: 0.05 10*3/uL (ref 0.00–0.07)
Basophils Absolute: 0 10*3/uL (ref 0.0–0.1)
Basophils Relative: 0 %
Eosinophils Absolute: 0 10*3/uL (ref 0.0–0.5)
Eosinophils Relative: 0 %
HCT: 36.9 % (ref 36.0–46.0)
Hemoglobin: 12.6 g/dL (ref 12.0–15.0)
Immature Granulocytes: 0 %
Lymphocytes Relative: 5 %
Lymphs Abs: 0.6 10*3/uL — ABNORMAL LOW (ref 0.7–4.0)
MCH: 29.6 pg (ref 26.0–34.0)
MCHC: 34.1 g/dL (ref 30.0–36.0)
MCV: 86.6 fL (ref 80.0–100.0)
Monocytes Absolute: 0.2 10*3/uL (ref 0.1–1.0)
Monocytes Relative: 2 %
Neutro Abs: 10.4 10*3/uL — ABNORMAL HIGH (ref 1.7–7.7)
Neutrophils Relative %: 93 %
Platelets: 202 10*3/uL (ref 150–400)
RBC: 4.26 MIL/uL (ref 3.87–5.11)
RDW: 14.2 % (ref 11.5–15.5)
WBC: 11.2 10*3/uL — ABNORMAL HIGH (ref 4.0–10.5)
nRBC: 0 % (ref 0.0–0.2)

## 2021-01-11 LAB — APTT: aPTT: 26 seconds (ref 24–36)

## 2021-01-11 LAB — PROTIME-INR
INR: 1 (ref 0.8–1.2)
Prothrombin Time: 13.5 seconds (ref 11.4–15.2)

## 2021-01-11 SURGERY — ANTERIOR LATERAL LUMBAR FUSION 3 LEVELS
Anesthesia: General | Laterality: Right

## 2021-01-11 MED ORDER — HYDROMORPHONE HCL 1 MG/ML IJ SOLN
0.2500 mg | INTRAMUSCULAR | Status: DC | PRN
  Administered 2021-01-11 (×4): 0.5 mg via INTRAVENOUS

## 2021-01-11 MED ORDER — HYDROCODONE-ACETAMINOPHEN 5-325 MG PO TABS: 1.0000 | ORAL_TABLET | ORAL | Status: DC | PRN

## 2021-01-11 MED ORDER — BUPIVACAINE-EPINEPHRINE 0.25% -1:200000 IJ SOLN
INTRAMUSCULAR | Status: DC | PRN
  Administered 2021-01-11: 5 mL

## 2021-01-11 MED ORDER — FENTANYL CITRATE (PF) 250 MCG/5ML IJ SOLN
INTRAMUSCULAR | Status: DC | PRN
  Administered 2021-01-11 (×5): 50 ug via INTRAVENOUS

## 2021-01-11 MED ORDER — AMISULPRIDE (ANTIEMETIC) 5 MG/2ML IV SOLN: 10.0000 mg | Freq: Once | INTRAVENOUS | Status: DC | PRN

## 2021-01-11 MED ORDER — LIDOCAINE 2% (20 MG/ML) 5 ML SYRINGE
INTRAMUSCULAR | Status: AC
  Filled 2021-01-11: qty 5

## 2021-01-11 MED ORDER — HYDROCHLOROTHIAZIDE 25 MG PO TABS
25.0000 mg | ORAL_TABLET | Freq: Every day | ORAL | Status: DC
  Administered 2021-01-11 – 2021-01-13 (×2): 25 mg via ORAL
  Filled 2021-01-11 (×2): qty 1

## 2021-01-11 MED ORDER — FLEET ENEMA 7-19 GM/118ML RE ENEM: 1.0000 | ENEMA | Freq: Once | RECTAL | Status: DC | PRN

## 2021-01-11 MED ORDER — ACETAMINOPHEN 650 MG RE SUPP: 650.0000 mg | RECTAL | Status: DC | PRN

## 2021-01-11 MED ORDER — SODIUM CHLORIDE 0.9 % IV SOLN: 250.0000 mL | INTRAVENOUS | Status: DC

## 2021-01-11 MED ORDER — HYDROMORPHONE HCL 1 MG/ML IJ SOLN
INTRAMUSCULAR | Status: AC
  Filled 2021-01-11: qty 1

## 2021-01-11 MED ORDER — PHENYLEPHRINE HCL-NACL 20-0.9 MG/250ML-% IV SOLN
INTRAVENOUS | Status: DC | PRN
  Administered 2021-01-11: 40 ug/min via INTRAVENOUS

## 2021-01-11 MED ORDER — COLCHICINE 0.6 MG PO TABS
0.6000 mg | ORAL_TABLET | Freq: Two times a day (BID) | ORAL | Status: DC | PRN
  Filled 2021-01-11: qty 1

## 2021-01-11 MED ORDER — SODIUM CHLORIDE 0.9% FLUSH
3.0000 mL | Freq: Two times a day (BID) | INTRAVENOUS | Status: DC
  Administered 2021-01-11 – 2021-01-12 (×3): 3 mL via INTRAVENOUS

## 2021-01-11 MED ORDER — LIDOCAINE 2% (20 MG/ML) 5 ML SYRINGE
INTRAMUSCULAR | Status: DC | PRN
  Administered 2021-01-11: 60 mg via INTRAVENOUS

## 2021-01-11 MED ORDER — MIDAZOLAM HCL 2 MG/2ML IJ SOLN
INTRAMUSCULAR | Status: AC
  Filled 2021-01-11: qty 2

## 2021-01-11 MED ORDER — VITAMIN B-12 1000 MCG PO TABS
2000.0000 ug | ORAL_TABLET | Freq: Every day | ORAL | Status: DC
  Administered 2021-01-11: 2000 ug via ORAL
  Filled 2021-01-11 (×2): qty 2

## 2021-01-11 MED ORDER — BUPIVACAINE-EPINEPHRINE (PF) 0.25% -1:200000 IJ SOLN
INTRAMUSCULAR | Status: AC
  Filled 2021-01-11: qty 30

## 2021-01-11 MED ORDER — PANTOPRAZOLE SODIUM 40 MG PO TBEC
80.0000 mg | DELAYED_RELEASE_TABLET | Freq: Every day | ORAL | Status: DC
  Administered 2021-01-12: 80 mg via ORAL
  Filled 2021-01-11 (×2): qty 2

## 2021-01-11 MED ORDER — LACTATED RINGERS IV SOLN: INTRAVENOUS | Status: DC

## 2021-01-11 MED ORDER — ENSURE PRE-SURGERY PO LIQD
296.0000 mL | Freq: Once | ORAL | Status: AC
  Administered 2021-01-12: 296 mL via ORAL
  Filled 2021-01-11: qty 296

## 2021-01-11 MED ORDER — POTASSIUM CHLORIDE IN NACL 20-0.9 MEQ/L-% IV SOLN
INTRAVENOUS | Status: DC
  Filled 2021-01-11: qty 1000

## 2021-01-11 MED ORDER — SENNOSIDES-DOCUSATE SODIUM 8.6-50 MG PO TABS: 1.0000 | ORAL_TABLET | Freq: Every evening | ORAL | Status: DC | PRN

## 2021-01-11 MED ORDER — FOLIC ACID 1 MG PO TABS
1.0000 mg | ORAL_TABLET | Freq: Every day | ORAL | Status: DC
  Administered 2021-01-11: 1 mg via ORAL
  Filled 2021-01-11 (×2): qty 1

## 2021-01-11 MED ORDER — MORPHINE SULFATE (PF) 2 MG/ML IV SOLN: 1.0000 mg | INTRAVENOUS | Status: DC | PRN

## 2021-01-11 MED ORDER — ACETAMINOPHEN 10 MG/ML IV SOLN
INTRAVENOUS | Status: AC
  Filled 2021-01-11: qty 100

## 2021-01-11 MED ORDER — PROPOFOL 10 MG/ML IV BOLUS
INTRAVENOUS | Status: DC | PRN
  Administered 2021-01-11: 130 mg via INTRAVENOUS

## 2021-01-11 MED ORDER — METHOCARBAMOL 500 MG PO TABS
500.0000 mg | ORAL_TABLET | Freq: Four times a day (QID) | ORAL | Status: DC | PRN
  Administered 2021-01-11 – 2021-01-13 (×3): 500 mg via ORAL
  Filled 2021-01-11 (×3): qty 1

## 2021-01-11 MED ORDER — DARIFENACIN HYDROBROMIDE ER 7.5 MG PO TB24
7.5000 mg | ORAL_TABLET | Freq: Every day | ORAL | Status: DC
  Administered 2021-01-11: 7.5 mg via ORAL
  Filled 2021-01-11 (×3): qty 1

## 2021-01-11 MED ORDER — ROCURONIUM BROMIDE 10 MG/ML (PF) SYRINGE
PREFILLED_SYRINGE | INTRAVENOUS | Status: AC
  Filled 2021-01-11: qty 10

## 2021-01-11 MED ORDER — DEXAMETHASONE SODIUM PHOSPHATE 10 MG/ML IJ SOLN
INTRAMUSCULAR | Status: AC
  Filled 2021-01-11: qty 1

## 2021-01-11 MED ORDER — ROCURONIUM BROMIDE 10 MG/ML (PF) SYRINGE
PREFILLED_SYRINGE | INTRAVENOUS | Status: DC | PRN
  Administered 2021-01-11: 60 mg via INTRAVENOUS

## 2021-01-11 MED ORDER — GABAPENTIN 300 MG PO CAPS
600.0000 mg | ORAL_CAPSULE | Freq: Three times a day (TID) | ORAL | Status: DC
  Administered 2021-01-11 – 2021-01-12 (×5): 600 mg via ORAL
  Filled 2021-01-11 (×6): qty 2

## 2021-01-11 MED ORDER — LACTATED RINGERS IV SOLN: INTRAVENOUS | Status: DC | PRN

## 2021-01-11 MED ORDER — ESTROGENS CONJUGATED 0.625 MG PO TABS
0.6250 mg | ORAL_TABLET | Freq: Every evening | ORAL | Status: DC
  Administered 2021-01-11 – 2021-01-12 (×2): 0.625 mg via ORAL
  Filled 2021-01-11 (×3): qty 1

## 2021-01-11 MED ORDER — ALBUMIN HUMAN 5 % IV SOLN: INTRAVENOUS | Status: DC | PRN

## 2021-01-11 MED ORDER — LEVOTHYROXINE SODIUM 75 MCG PO TABS
75.0000 ug | ORAL_TABLET | Freq: Every day | ORAL | Status: DC
  Administered 2021-01-12 – 2021-01-13 (×2): 75 ug via ORAL
  Filled 2021-01-11 (×2): qty 1

## 2021-01-11 MED ORDER — MENTHOL 3 MG MT LOZG: 1.0000 | LOZENGE | OROMUCOSAL | Status: DC | PRN

## 2021-01-11 MED ORDER — VANCOMYCIN HCL IN DEXTROSE 1-5 GM/200ML-% IV SOLN
1000.0000 mg | Freq: Once | INTRAVENOUS | Status: AC
  Administered 2021-01-11: 1000 mg via INTRAVENOUS
  Filled 2021-01-11: qty 200

## 2021-01-11 MED ORDER — ACETAMINOPHEN 325 MG PO TABS: 650.0000 mg | ORAL_TABLET | ORAL | Status: DC | PRN

## 2021-01-11 MED ORDER — SUGAMMADEX SODIUM 200 MG/2ML IV SOLN
INTRAVENOUS | Status: DC | PRN
  Administered 2021-01-11: 200 mg via INTRAVENOUS

## 2021-01-11 MED ORDER — ACETAMINOPHEN 10 MG/ML IV SOLN
1000.0000 mg | Freq: Once | INTRAVENOUS | Status: DC | PRN
  Administered 2021-01-11: 1000 mg via INTRAVENOUS

## 2021-01-11 MED ORDER — SIMVASTATIN 20 MG PO TABS
40.0000 mg | ORAL_TABLET | Freq: Every day | ORAL | Status: DC
  Administered 2021-01-11 – 2021-01-12 (×2): 40 mg via ORAL
  Filled 2021-01-11 (×3): qty 2

## 2021-01-11 MED ORDER — ORAL CARE MOUTH RINSE: 15.0000 mL | Freq: Once | OROMUCOSAL | Status: AC

## 2021-01-11 MED ORDER — ACETAMINOPHEN 325 MG PO TABS: 325.0000 mg | ORAL_TABLET | Freq: Once | ORAL | Status: DC | PRN

## 2021-01-11 MED ORDER — ZOLPIDEM TARTRATE 5 MG PO TABS: 5.0000 mg | ORAL_TABLET | Freq: Every evening | ORAL | Status: DC | PRN

## 2021-01-11 MED ORDER — PROMETHAZINE HCL 25 MG/ML IJ SOLN: 6.2500 mg | INTRAMUSCULAR | Status: DC | PRN

## 2021-01-11 MED ORDER — ONDANSETRON HCL 4 MG/2ML IJ SOLN
INTRAMUSCULAR | Status: AC
  Filled 2021-01-11: qty 2

## 2021-01-11 MED ORDER — METHOCARBAMOL 1000 MG/10ML IJ SOLN
500.0000 mg | Freq: Four times a day (QID) | INTRAVENOUS | Status: DC | PRN
  Filled 2021-01-11 (×3): qty 5

## 2021-01-11 MED ORDER — POVIDONE-IODINE 7.5 % EX SOLN: Freq: Once | CUTANEOUS | Status: AC

## 2021-01-11 MED ORDER — MONTELUKAST SODIUM 10 MG PO TABS: 10.0000 mg | ORAL_TABLET | Freq: Every day | ORAL | Status: DC

## 2021-01-11 MED ORDER — DEXAMETHASONE SODIUM PHOSPHATE 10 MG/ML IJ SOLN
INTRAMUSCULAR | Status: DC | PRN
  Administered 2021-01-11: 10 mg via INTRAVENOUS

## 2021-01-11 MED ORDER — OXYCODONE-ACETAMINOPHEN 5-325 MG PO TABS
1.0000 | ORAL_TABLET | ORAL | Status: DC | PRN
  Administered 2021-01-11 – 2021-01-13 (×5): 2 via ORAL
  Filled 2021-01-11 (×5): qty 2

## 2021-01-11 MED ORDER — PHENOL 1.4 % MT LIQD: 1.0000 | OROMUCOSAL | Status: DC | PRN

## 2021-01-11 MED ORDER — SODIUM CHLORIDE 0.9% FLUSH: 3.0000 mL | INTRAVENOUS | Status: DC | PRN

## 2021-01-11 MED ORDER — ACETAMINOPHEN 160 MG/5ML PO SOLN: 325.0000 mg | Freq: Once | ORAL | Status: DC | PRN

## 2021-01-11 MED ORDER — 0.9 % SODIUM CHLORIDE (POUR BTL) OPTIME
TOPICAL | Status: DC | PRN
  Administered 2021-01-11: 1000 mL

## 2021-01-11 MED ORDER — ONDANSETRON HCL 4 MG PO TABS: 4.0000 mg | ORAL_TABLET | Freq: Four times a day (QID) | ORAL | Status: DC | PRN

## 2021-01-11 MED ORDER — POLYETHYLENE GLYCOL 3350 17 G PO PACK
17.0000 g | PACK | Freq: Every day | ORAL | Status: DC
  Administered 2021-01-12: 17 g via ORAL
  Filled 2021-01-11 (×2): qty 1

## 2021-01-11 MED ORDER — MEPERIDINE HCL 25 MG/ML IJ SOLN: 6.2500 mg | INTRAMUSCULAR | Status: DC | PRN

## 2021-01-11 MED ORDER — FENTANYL CITRATE (PF) 250 MCG/5ML IJ SOLN
INTRAMUSCULAR | Status: AC
  Filled 2021-01-11: qty 5

## 2021-01-11 MED ORDER — FLUTICASONE PROPIONATE 50 MCG/ACT NA SUSP
2.0000 | Freq: Every day | NASAL | Status: DC | PRN
  Filled 2021-01-11: qty 16

## 2021-01-11 MED ORDER — IRBESARTAN 150 MG PO TABS
150.0000 mg | ORAL_TABLET | Freq: Every day | ORAL | Status: DC
  Administered 2021-01-11 – 2021-01-13 (×2): 150 mg via ORAL
  Filled 2021-01-11 (×2): qty 1

## 2021-01-11 MED ORDER — GLYCOPYRROLATE PF 0.2 MG/ML IJ SOSY
PREFILLED_SYRINGE | INTRAMUSCULAR | Status: DC | PRN
  Administered 2021-01-11: .2 mg via INTRAVENOUS

## 2021-01-11 MED ORDER — THROMBIN 20000 UNITS EX SOLR
CUTANEOUS | Status: DC | PRN
  Administered 2021-01-11: 20 mL via TOPICAL

## 2021-01-11 MED ORDER — ONDANSETRON HCL 4 MG/2ML IJ SOLN
INTRAMUSCULAR | Status: DC | PRN
  Administered 2021-01-11: 4 mg via INTRAVENOUS

## 2021-01-11 MED ORDER — THROMBIN (RECOMBINANT) 20000 UNITS EX SOLR
CUTANEOUS | Status: AC
  Filled 2021-01-11: qty 20000

## 2021-01-11 MED ORDER — ALUM & MAG HYDROXIDE-SIMETH 200-200-20 MG/5ML PO SUSP: 30.0000 mL | Freq: Four times a day (QID) | ORAL | Status: DC | PRN

## 2021-01-11 MED ORDER — MIDAZOLAM HCL 2 MG/2ML IJ SOLN
INTRAMUSCULAR | Status: DC | PRN
  Administered 2021-01-11: 1 mg via INTRAVENOUS

## 2021-01-11 MED ORDER — DOCUSATE SODIUM 100 MG PO CAPS
100.0000 mg | ORAL_CAPSULE | Freq: Two times a day (BID) | ORAL | Status: DC
  Administered 2021-01-11 – 2021-01-12 (×3): 100 mg via ORAL
  Filled 2021-01-11 (×4): qty 1

## 2021-01-11 MED ORDER — CHLORHEXIDINE GLUCONATE 0.12 % MT SOLN
15.0000 mL | Freq: Once | OROMUCOSAL | Status: AC
  Administered 2021-01-11: 15 mL via OROMUCOSAL
  Filled 2021-01-11: qty 15

## 2021-01-11 MED ORDER — PROPOFOL 10 MG/ML IV BOLUS
INTRAVENOUS | Status: AC
  Filled 2021-01-11: qty 20

## 2021-01-11 MED ORDER — BISACODYL 5 MG PO TBEC: 5.0000 mg | DELAYED_RELEASE_TABLET | Freq: Every day | ORAL | Status: DC | PRN

## 2021-01-11 MED ORDER — VANCOMYCIN HCL IN DEXTROSE 1-5 GM/200ML-% IV SOLN
1000.0000 mg | INTRAVENOUS | Status: AC
  Administered 2021-01-11: 1000 mg via INTRAVENOUS
  Filled 2021-01-11: qty 200

## 2021-01-11 MED ORDER — VANCOMYCIN HCL 1500 MG/300ML IV SOLN
1500.0000 mg | INTRAVENOUS | Status: AC
  Administered 2021-01-12: 1500 mg via INTRAVENOUS
  Filled 2021-01-11: qty 300

## 2021-01-11 MED ORDER — ONDANSETRON HCL 4 MG/2ML IJ SOLN: 4.0000 mg | Freq: Four times a day (QID) | INTRAMUSCULAR | Status: DC | PRN

## 2021-01-11 MED ORDER — PROPOFOL 500 MG/50ML IV EMUL
INTRAVENOUS | Status: DC | PRN
Start: 2021-01-11 — End: 2021-01-11
  Administered 2021-01-11: 40 ug/kg/min via INTRAVENOUS

## 2021-01-11 SURGICAL SUPPLY — 64 items
BAG COUNTER SPONGE SURGICOUNT (BAG) ×2 IMPLANT
BENZOIN TINCTURE PRP APPL 2/3 (GAUZE/BANDAGES/DRESSINGS) ×2 IMPLANT
BLADE CLIPPER SURG (BLADE) IMPLANT
BLADE EXTENDER LTP N26 DISP (ORTHOPEDIC DISPOSABLE SUPPLIES) ×2 IMPLANT
BLADE SURG 10 STRL SS (BLADE) ×2 IMPLANT
BONE VIVIGEN FORMABLE 1.3CC (Bone Implant) ×2 IMPLANT
BONE VIVIGEN FORMABLE 10CC (Bone Implant) ×2 IMPLANT
CLIP SPRING STIM LLIF SAFEOP (CLIP) ×2 IMPLANT
CORD BIPOLAR FORCEPS 12FT (ELECTRODE) ×2 IMPLANT
COVER SURGICAL LIGHT HANDLE (MISCELLANEOUS) ×2 IMPLANT
DILATOR INSULATED LLIF 8-13-18 (NEUROSURGERY SUPPLIES) ×2 IMPLANT
DRAPE C-ARM 42X72 X-RAY (DRAPES) ×4 IMPLANT
DRAPE C-ARMOR (DRAPES) ×2 IMPLANT
DRAPE POUCH INSTRU U-SHP 10X18 (DRAPES) ×2 IMPLANT
DRAPE SURG 17X23 STRL (DRAPES) ×2 IMPLANT
DRAPE U-SHAPE 47X51 STRL (DRAPES) ×2 IMPLANT
DRSG MEPILEX BORDER 4X8 (GAUZE/BANDAGES/DRESSINGS) IMPLANT
DURAPREP 26ML APPLICATOR (WOUND CARE) ×2 IMPLANT
ELECT BLADE 4.0 EZ CLEAN MEGAD (MISCELLANEOUS) ×2
ELECT CAUTERY BLADE 6.4 (BLADE) ×2 IMPLANT
ELECT KIT SAFEOP SSEP/SURF (KITS) ×2
ELECT REM PT RETURN 9FT ADLT (ELECTROSURGICAL) ×2
ELECTRODE BLDE 4.0 EZ CLN MEGD (MISCELLANEOUS) ×1 IMPLANT
ELECTRODE KT SAFEOP SSEP/SURF (KITS) ×1 IMPLANT
ELECTRODE REM PT RTRN 9FT ADLT (ELECTROSURGICAL) ×1 IMPLANT
GAUZE 4X4 16PLY ~~LOC~~+RFID DBL (SPONGE) ×2 IMPLANT
GAUZE SPONGE 4X4 12PLY STRL (GAUZE/BANDAGES/DRESSINGS) ×2 IMPLANT
GLOVE SRG 8 PF TXTR STRL LF DI (GLOVE) ×1 IMPLANT
GLOVE SURG ENC MOIS LTX SZ7 (GLOVE) ×4 IMPLANT
GLOVE SURG ENC MOIS LTX SZ8 (GLOVE) ×2 IMPLANT
GLOVE SURG UNDER POLY LF SZ7 (GLOVE) ×4 IMPLANT
GLOVE SURG UNDER POLY LF SZ8 (GLOVE) ×2
GOWN STRL REUS W/ TWL LRG LVL3 (GOWN DISPOSABLE) ×3 IMPLANT
GOWN STRL REUS W/TWL LRG LVL3 (GOWN DISPOSABLE) ×6
GUIDEWIRE LLIF TT 320 (WIRE) ×2 IMPLANT
KIT BASIN OR (CUSTOM PROCEDURE TRAY) ×2 IMPLANT
KIT TURNOVER KIT B (KITS) ×2 IMPLANT
KNIFE ANNULOTOMY (BLADE) ×2 IMPLANT
NEEDLE HYPO 25GX1X1/2 BEV (NEEDLE) ×2 IMPLANT
NS IRRIG 1000ML POUR BTL (IV SOLUTION) ×2 IMPLANT
PACK LAMINECTOMY ORTHO (CUSTOM PROCEDURE TRAY) ×2 IMPLANT
PACK UNIVERSAL I (CUSTOM PROCEDURE TRAY) ×2 IMPLANT
PAD ARMBOARD 7.5X6 YLW CONV (MISCELLANEOUS) ×4 IMPLANT
PROBE BALL TIP LLIF SAFEOP (NEUROSURGERY SUPPLIES) ×2 IMPLANT
SHIM INTRADISCAL LTP N DISP (ORTHOPEDIC DISPOSABLE SUPPLIES) ×2 IMPLANT
SPACER LIF PEEK 10X18X60 10D (Spacer) ×2 IMPLANT
SPACER LIF PEEK 8X18X55 10D (Spacer) ×2 IMPLANT
SPONGE INTESTINAL PEANUT (DISPOSABLE) ×6 IMPLANT
SPONGE SURGIFOAM ABS GEL 100 (HEMOSTASIS) ×2 IMPLANT
SPONGE T-LAP 4X18 ~~LOC~~+RFID (SPONGE) ×2 IMPLANT
STRIP CLOSURE SKIN 1/2X4 (GAUZE/BANDAGES/DRESSINGS) ×2 IMPLANT
SURGIFLO W/THROMBIN 8M KIT (HEMOSTASIS) IMPLANT
SUT MNCRL AB 4-0 PS2 18 (SUTURE) ×2 IMPLANT
SUT PROLENE 5 0 C 1 24 (SUTURE) IMPLANT
SUT VIC AB 1 CT1 27 (SUTURE) ×4
SUT VIC AB 1 CT1 27XBRD ANBCTR (SUTURE) ×2 IMPLANT
SUT VIC AB 2-0 CT2 18 VCP726D (SUTURE) ×4 IMPLANT
SYR BULB IRRIG 60ML STRL (SYRINGE) ×2 IMPLANT
TAPE CLOTH SURG 6X10 WHT LF (GAUZE/BANDAGES/DRESSINGS) ×2 IMPLANT
TOWEL GREEN STERILE (TOWEL DISPOSABLE) ×2 IMPLANT
TOWEL GREEN STERILE FF (TOWEL DISPOSABLE) ×2 IMPLANT
TRAY FOLEY MTR SLVR 16FR STAT (SET/KITS/TRAYS/PACK) ×2 IMPLANT
WATER STERILE IRR 1000ML POUR (IV SOLUTION) ×2 IMPLANT
YANKAUER SUCT BULB TIP NO VENT (SUCTIONS) ×2 IMPLANT

## 2021-01-11 NOTE — Anesthesia Preprocedure Evaluation (Addendum)
Anesthesia Evaluation  Patient identified by MRN, date of birth, ID band Patient awake    Reviewed: Allergy & Precautions, NPO status , Patient's Chart, lab work & pertinent test results  Airway Mallampati: II  TM Distance: >3 FB Neck ROM: Full    Dental  (+) Teeth Intact, Dental Advisory Given   Pulmonary neg pulmonary ROS,    Pulmonary exam normal        Cardiovascular Exercise Tolerance: Good hypertension, Pt. on medications  Rhythm:Regular Rate:Normal     Neuro/Psych  Headaches,  Neuromuscular disease negative psych ROS   GI/Hepatic Neg liver ROS, GERD  Medicated,  Endo/Other  Hypothyroidism High cholesterol   Renal/GU negative Renal ROS  negative genitourinary   Musculoskeletal  (+) Arthritis ,   Abdominal Normal abdominal exam  (+)   Peds negative pediatric ROS (+)  Hematology   Anesthesia Other Findings   Reproductive/Obstetrics                            Anesthesia Physical  Anesthesia Plan  ASA: 3  Anesthesia Plan: General   Post-op Pain Management:    Induction: Intravenous  PONV Risk Score and Plan: 4 or greater and Ondansetron, Dexamethasone and Treatment may vary due to age or medical condition  Airway Management Planned: Oral ETT  Additional Equipment: None  Intra-op Plan:   Post-operative Plan: Extubation in OR  Informed Consent: I have reviewed the patients History and Physical, chart, labs and discussed the procedure including the risks, benefits and alternatives for the proposed anesthesia with the patient or authorized representative who has indicated his/her understanding and acceptance.       Plan Discussed with: CRNA and Anesthesiologist  Anesthesia Plan Comments:        Anesthesia Quick Evaluation

## 2021-01-11 NOTE — Op Note (Signed)
PATIENT NAME: Kristine [name]   MEDICAL RECORD NO.:   [id number]    DATE OF BIRTH: [birth year]   DATE OF PROCEDURE: 01/11/2021                               OPERATIVE REPORT   PREOPERATIVE DIAGNOSES: 1.  Left-sided lumbar radiculopathy. 2.  L3/4, L4-5 spinal stenosis. 3.  S/p previous lumbar decompression  POSTOPERATIVE DIAGNOSES: 1.  Left-sided lumbar radiculopathy. 2.  L3/4, L4-5 spinal stenosis. 3.  S/p previous lumbar decompression  PROCEDURE:  1.  Right-sided lateral interbody fusion via direct lateral retroperitoneal approach, L3/4, L4/5 2.  Insertion of interbody device x 2 (Alphatec intervertebral spacers). 3.  Use of morselized allograft -- ViviGen.   4.  Intraoperative use of fluoroscopy.  SURGEON:  Phylliss Bob, MD  ASSISTANT:  Pricilla Holm PA-C.  ANESTHESIA:  General endotracheal anesthesia.  COMPLICATIONS:  None.  DISPOSITION:  Stable.  ESTIMATED BLOOD LOSS:  Minimal.  INDICATIONS:  Briefly, the patient is a very pleasant 80 year old female who did present to me with ongoing pain and weakness in the left leg.  He did have significant degenerative changes and spinal stenosis at L3/4 and L4/5, which I did feel was  correlating to her symptoms. Given the patient's ongoing symptoms, and lack of improvement with appropriate conservative treatment measures, I did discuss proceeding with a 2-stage procedure, starting with a direct lateral interbody fusion as reflected above.  The patient was fully aware of the risks and limitations of surgery, and did wish to proceed.  DESCRIPTION OF PROCEDURE:  On 01/11/2021, the patient was brought to surgery and general endotracheal anesthesia was administered.  The patient was placed in the lateral decubitus position, with the right side up.  Neurologic monitoring leads were placed  by the monitoring technician.  The patient's torso and lower extremities were secured to the bed.  The patient's hips and knees were flexed in order to  lessen the tension on the psoas musculature.  The right flank was then prepped and draped in the usual sterile fashion.  The bed was flexed, in order to optimize exposure to the intervertebral spaces.  After a timeout procedure was performed, a right-sided transverse incision was made over the right flank overlying the L4-5 intervertebral space.   I then dissected just above the iliac crest, and through the external and internal oblique musculature, and transversalis musculature and fascia.  The retroperitoneal space was encountered.  The peritoneum was bluntly swept anteriorly, and the psoas was readily identified.  I did use a series of dilators to dock over the L4-5 intervertebral space.  I did use neurologic monitoring while placing the dilators, in order to ensure that there were no neurologic structures in the immediate vicinity of the dilators.  The lumbar plexus was noted to be posterior.  A self-retaining retractor was placed, and was attached to a rigid arm.  The retractor was very gently dilated and a shim was placed into the posterior aspect of the L4/5 intervertebral space.  I then used a knife to perform an annulotomy at the lateral aspect of the L4-5 intervertebral space.  I then used a series of curettes and pituitary rongeurs in order to perform a thorough and complete L4-5 intervertebral diskectomy.  The contralateral annulus was released.  I then placed a series of intervertebral spacer trials, and I did feel that a 12.8 x 18 mm x 60 mm lordotic spacer would  be the most appropriate fit.  The appropriate spacer was then packed with ViviGen and tamped into position.  I was very pleased with the final resting position of the intervertebral spacer.     The shim secured into the intervertebral space was removed, as was the retractor.  I then dissected superiorly, and again entered the psoas muscle over the region of the L3-4 intervertebral space.  As previously noted, dilators were placed and a  rigid retractor was secured over the L3-4 intervertebral space, liberally using neurologic monitoring, in order to ensure that there is no neurologic structures in the vicinity of the dilators.  Once again, a thorough and complete discectomy was performed, this time at the L3-4 level.  The contralateral annulus was released, and the endplates were appropriately prepared.  AP and lateral fluoroscopy was liberally utilized throughout the procedure.  An intervertebral spacer (11 x 18 x 55 mm, lordotic) was then inserted across the intervertebral space, after liberally packing it with Vivigen.  I was very pleased with the resting position of the implant, and with the final AP and lateral fluoroscopic images.    There was no sustained abnormal EMG activity noted throughout the surgery.  At this point, the wound was copiously irrigated, and closed in layers.  Using #1 Vicryl, followed by 2-0 Vicryl, followed by 4-0 Monocryl. Benzoin and Steri-Strips were applied followed by sterile dressing.  All instrument counts were correct at the termination of the procedure.  Of note, Pricilla Holm was my assistant throughout surgery, and did aid in retraction, suctioning, placement of the hardware, and closure.   Phylliss Bob, MD

## 2021-01-11 NOTE — OR Nursing (Signed)
Per Protocol Dr. Tery Sanfilippo called on 01/11/2021 at 1140 and confirmed there was no instrumentation or foreign body retained.

## 2021-01-11 NOTE — H&P (Signed)
PREOPERATIVE H&P  Chief Complaint: Left leg pain  HPI: Kristine [name] is a 80 y.o. female who presents with ongoing pain in the left leg  MRI reveals severe stenosis at L3/4 and L4/5 with instability. Patient is s/p a previous lumbar decompression.   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   Sinusitis    chronic   Thyroid disease    Vertigo    HX of, last episode over 18 months ago   Past Surgical History:  Procedure Laterality Date   ABDOMINAL HYSTERECTOMY  1996   BACK SURGERY  2019   L4-5    BREAST BIOPSY     BREAST EXCISIONAL BIOPSY Left 1980   benign   CATARACT EXTRACTION W/PHACO Right 09/08/2019   Procedure: CATARACT EXTRACTION PHACO AND INTRAOCULAR LENS PLACEMENT ([employer]) RIGHT VIVITY LENS 8.89  01:14.2;  Surgeon: Birder Robson, MD;  Location: [facility];  Service: Ophthalmology;  Laterality: Right;  prefers later morning   CATARACT EXTRACTION W/PHACO Left 09/29/2019   Procedure: CATARACT EXTRACTION PHACO AND INTRAOCULAR LENS PLACEMENT ([facility]) LEFT VIVITY 6.45  00:41.0;  Surgeon: Birder Robson, MD;  Location: [facility];  Service: Ophthalmology;  Laterality: Left;   COLONOSCOPY     DILATION AND CURETTAGE OF UTERUS     EYE SURGERY     HEMORROIDECTOMY  2006, 2009   HIP SURGERY  2011   L hip replacement   JOINT REPLACEMENT Left 2011   Total hip   LIPOMA EXCISION     LUMBAR LAMINECTOMY/DECOMPRESSION MICRODISCECTOMY N/A 08/28/2017   Procedure:  Decompression L3-5, Excision of synovial cyst L4-5;  Surgeon: Melina Schools, MD;  Location: [facility];  Service: Orthopedics;  Laterality: N/A;  3.5 hrs   RECTAL POLYPECTOMY  2010   RECTOCELE REPAIR  2009   TOOTH EXTRACTION     #12   TOTAL ABDOMINAL HYSTERECTOMY W/ BILATERAL SALPINGOOPHORECTOMY     TOTAL HIP ARTHROPLASTY Right 07/13/2020   Procedure: TOTAL HIP ARTHROPLASTY ANTERIOR APPROACH;  Surgeon: Gaynelle Arabian, MD;  Location: [facility];  Service: Orthopedics;  Laterality: Right;  140mn   TUBAL LIGATION     Social History   Socioeconomic History   Marital status: Married    Spouse name: Not on file   Number of children: Not on file   Years of education: Not on file   Highest education level: Not on file  Occupational History   Not on file  Tobacco Use   Smoking status: Never   Smokeless tobacco: Never  Vaping Use   Vaping Use: Never used  Substance and Sexual Activity   Alcohol use: No   Drug use: No   Sexual activity: Not on file  Other Topics Concern   Not on file  Social History Narrative   Not on file   Social Determinants of Health   Financial  Resource Strain: Not on file  Food Insecurity: Not on file  Transportation Needs: Not on file  Physical Activity: Not on file  Stress: Not on file  Social Connections: Not on file   Family History  Problem Relation Age of Onset   Hyperlipidemia Mother    Heart disease Mother        heart attack   Kidney disease Father    Cancer Sister        multiple myeloma   Diabetes Brother    Colon cancer Neg Hx    Esophageal cancer Neg Hx    Rectal cancer Neg Hx    Stomach cancer Neg Hx    Breast cancer Neg Hx    Allergies  Allergen Reactions   Corn-Containing Products Diarrhea    Stomach ache   Influenza Vaccines     flushing   Meperidine Hcl Other (See Comments)    (Demerol) Flushing   Prior to Admission medications   Medication Sig Start Date End Date Taking? Authorizing Provider  amoxicillin (AMOXIL) 500 MG  capsule Take 500 mg by mouth See admin instructions. Take 2000 mg by mouth 1 hour prior to dental procedures   Yes [provider]  cyanocobalamin 2000 MCG tablet Take 2,000 mcg by mouth daily.   Yes [provider]  folic acid (FOLVITE) 1 MG tablet Take 1 mg by mouth daily. 02/24/20  Yes [provider]  gabapentin (NEURONTIN) 600 MG tablet Take 600 mg by mouth 3 (three) times daily. 09/23/18  Yes [provider]  hydrochlorothiazide (HYDRODIURIL) 25 MG tablet TAKE 1 TABLET BY MOUTH EVERY DAY 07/18/20  Yes Bedsole, Amy E, MD  HYDROcodone-acetaminophen (NORCO/VICODIN) 5-325 MG tablet Take 1-2 tablets by mouth every 6 (six) hours as needed for moderate pain or severe pain. 07/14/20  Yes Edmisten, Kristie L, PA  ibuprofen (ADVIL) 200 MG tablet Take 200 mg by mouth every 6 (six) hours as needed for moderate pain.   Yes [provider]  levothyroxine (SYNTHROID) 75 MCG tablet TAKE 1 TABLET DAILY BEFORE BREAKFAST Patient taking differently: Take 75 mcg by mouth daily before breakfast. TAKE 1 TABLET DAILY BEFORE BREAKFAST 02/23/20  Yes Bedsole, Amy E, MD  methocarbamol (ROBAXIN) 500 MG tablet Take 1 tablet (500 mg total) by mouth every 6 (six) hours as needed for muscle spasms. 07/14/20  Yes Edmisten, Kristie L, PA  methotrexate (RHEUMATREX) 2.5 MG tablet Take 10 mg by mouth every Monday. 05/20/20  Yes [provider]  Neomy-Bacit-Polymyx-Pramoxine (TRIPLE ANTIBIOTIC PAIN RELIEF) 1 % OINT Apply 1 application topically as needed (wound care).   Yes [provider]  Omega-3 Fatty Acids (FISH OIL) 1200 MG CAPS Take 2,400 mg by mouth 2 (two) times a day.   Yes [provider]  omeprazole (PRILOSEC) 20 MG capsule Take 20 mg by mouth daily.   Yes [provider]  polyethylene glycol (MIRALAX / GLYCOLAX) packet Take 17 g by mouth daily.   Yes [provider]  PREMARIN 0.625 MG tablet TAKE 1 TABLET DAILY Patient taking differently:  Take 0.625 mg by mouth every evening. 05/05/20  Yes Bedsole, Amy E, MD  simvastatin (ZOCOR) 40 MG tablet TAKE 1 TABLET (40MG) BY MOUTH ONCE IN THE EVENING 12/07/20  Yes Bedsole, Amy E, MD  solifenacin (VESICARE) 5 MG tablet Take 1 tablet (5 mg total) by mouth daily. Pt has not started as of 07/04/2020 09/14/20  Yes Bedsole, Amy E, MD  trolamine salicylate (ASPERCREME) 10 % cream Apply 1 application  topically at bedtime. Applied to feet at night   Yes [provider]  valsartan (DIOVAN) 160 MG tablet TAKE 1 TABLET BY MOUTH EVERY DAY 07/29/20  Yes Bedsole, Amy E, MD  colchicine 0.6 MG tablet Take 1 tablet (0.6 mg total) by mouth 2 (two) times daily as needed (for gout flare). 06/23/20   Bedsole, Amy E, MD  fluticasone (FLONASE) 50 MCG/ACT nasal spray Place 2 sprays into both nostrils daily as needed for allergies.    [provider]  montelukast (SINGULAIR) 10 MG tablet TAKE 1 TABLET AT BEDTIME 05/05/20   Bedsole, Amy E, MD  traMADol (ULTRAM) 50 MG tablet Take 1-2 tablets (50-100 mg total) by mouth every 6 (six) hours as needed for moderate pain. 07/14/20   Edmisten, Ok Anis, PA     All other systems have been reviewed and were otherwise negative with the exception of those mentioned in the HPI and as above.  Physical Exam: Vitals:   01/11/21 0703  BP: (!) 146/68  Pulse: 64  Resp: 18  Temp: 98.2 F (36.8 C)  SpO2: 98%    Body mass index is 35.19 kg/m.  General: Alert, no acute distress Cardiovascular: No pedal edema Respiratory: No cyanosis, no use of accessory musculature Skin: No lesions in the area of chief complaint Neurologic: Sensation intact distally Psychiatric: Patient is competent for consent with normal mood and affect Lymphatic: No axillary or cervical lymphadenopathy   Assessment/Plan: LEFT LUMBAR RADICULOPATHY Plan for Procedure(s): RIGHT LATERAL INTERBODY FUSION LUMBAR 3- LUMBAR 4, LUMBAR 4- LUMBAR 5 WITH INSTRUMENTATION AND ALLOGRAFT   Norva Karvonen, MD 01/11/2021 7:44 AM

## 2021-01-11 NOTE — Transfer of Care (Signed)
Immediate Anesthesia Transfer of Care Note  Patient: Kristine [name]  Procedure(s) Performed: RIGHT LATERAL INTERBODY FUSION LUMBAR THREE -LUMBAR FOUR, LUMBAR FOUR- LUMBAR FIVE WITH INSTRUMENTATION AND ALLOGRAFT (Right)  Patient Location: PACU  Anesthesia Type:General  Level of Consciousness: drowsy, patient cooperative and responds to stimulation  Airway & Oxygen Therapy: Patient Spontanous Breathing and Patient connected to face mask oxygen  Post-op Assessment: Report given to RN, Post -op Vital signs reviewed and stable and Patient moving all extremities X 4  Post vital signs: Reviewed and stable  Last Vitals:  Vitals Value Taken Time  BP    Temp    Pulse 64 01/11/21 1208  Resp 16 01/11/21 1208  SpO2 100 % 01/11/21 1208  Vitals shown include unvalidated device data.  Last Pain:  Vitals:   01/11/21 0703  TempSrc: Oral  PainSc: 5       Patients Stated Pain Goal: 2 (0000000 [id number])  Complications: No notable events documented.

## 2021-01-11 NOTE — Anesthesia Preprocedure Evaluation (Addendum)
Anesthesia Evaluation  Patient identified by MRN, date of birth, ID band Patient awake    Reviewed: Allergy & Precautions, NPO status , Patient's Chart, lab work & pertinent test results  Airway Mallampati: II  TM Distance: >3 FB Neck ROM: Full    Dental  (+) Teeth Intact, Dental Advisory Given   Pulmonary    Pulmonary exam normal        Cardiovascular hypertension, Pt. on medications  Rhythm:Regular Rate:Normal     Neuro/Psych  Headaches,  Neuromuscular disease negative psych ROS   GI/Hepatic Neg liver ROS, GERD  Medicated,  Endo/Other  Hypothyroidism   Renal/GU negative Renal ROS     Musculoskeletal  (+) Arthritis ,   Abdominal Normal abdominal exam  (+)   Peds  Hematology   Anesthesia Other Findings   Reproductive/Obstetrics                            Anesthesia Physical Anesthesia Plan  ASA: 3  Anesthesia Plan: General   Post-op Pain Management:    Induction: Intravenous  PONV Risk Score and Plan: 4 or greater and Ondansetron, Dexamethasone and Treatment may vary due to age or medical condition  Airway Management Planned: Oral ETT  Additional Equipment: None  Intra-op Plan:   Post-operative Plan: Extubation in OR  Informed Consent: I have reviewed the patients History and Physical, chart, labs and discussed the procedure including the risks, benefits and alternatives for the proposed anesthesia with the patient or authorized representative who has indicated his/her understanding and acceptance.       Plan Discussed with: CRNA  Anesthesia Plan Comments:        Anesthesia Quick Evaluation

## 2021-01-11 NOTE — Anesthesia Postprocedure Evaluation (Signed)
Anesthesia Post Note  Patient: Kristine [name]  Procedure(s) Performed: RIGHT LATERAL INTERBODY FUSION LUMBAR THREE -LUMBAR FOUR, LUMBAR FOUR- LUMBAR FIVE WITH INSTRUMENTATION AND ALLOGRAFT (Right)     Patient location during evaluation: PACU Anesthesia Type: General Level of consciousness: awake and alert Pain management: pain level controlled Vital Signs Assessment: post-procedure vital signs reviewed and stable Respiratory status: spontaneous breathing, nonlabored ventilation, respiratory function stable and patient connected to nasal cannula oxygen Cardiovascular status: blood pressure returned to baseline and stable Postop Assessment: no apparent nausea or vomiting Anesthetic complications: no   No notable events documented.  Last Vitals:  Vitals:   01/11/21 1353 01/11/21 1629  BP: (!) 175/63 (!) 138/58  Pulse: (!) 59 67  Resp: 18 16  Temp:  (!) 36.4 C  SpO2: 100% 100%    Last Pain:  Vitals:   01/11/21 1629  TempSrc: Oral  PainSc:                  [name]

## 2021-01-11 NOTE — Anesthesia Procedure Notes (Signed)
Procedure Name: Intubation Date/Time: 01/11/2021 8:45 AM Performed by: Michele Rockers, CRNA Pre-anesthesia Checklist: Patient identified, Patient being monitored, Timeout performed, Emergency Drugs available and Suction available Patient Re-evaluated:Patient Re-evaluated prior to induction Oxygen Delivery Method: Circle System Utilized Preoxygenation: Pre-oxygenation with 100% oxygen Induction Type: IV induction Ventilation: Mask ventilation without difficulty and Oral airway inserted - appropriate to patient size Laryngoscope Size: Sabra Heck and 2 Grade View: Grade I Tube type: Oral Tube size: 7.0 mm Number of attempts: 1 Airway Equipment and Method: Stylet Placement Confirmation: ETT inserted through vocal cords under direct vision, positive ETCO2 and breath sounds checked- equal and bilateral Secured at: 21 cm Tube secured with: Tape Dental Injury: Teeth and Oropharynx as per pre-operative assessment

## 2021-01-12 ENCOUNTER — Encounter (HOSPITAL_COMMUNITY)
Admission: RE | Disposition: A | Discharge status: Home / Self Care | Payer: Self-pay | Source: Home / Self Care | Attending: Orthopedic Surgery

## 2021-01-12 ENCOUNTER — Inpatient Hospital Stay (HOSPITAL_COMMUNITY): Payer: Medicare Other | Admitting: Anesthesiology

## 2021-01-12 ENCOUNTER — Inpatient Hospital Stay (HOSPITAL_COMMUNITY): Admission: RE | Admit: 2021-01-12 | Payer: Medicare Other | Source: Home / Self Care | Admitting: Orthopedic Surgery

## 2021-01-12 ENCOUNTER — Inpatient Hospital Stay (HOSPITAL_COMMUNITY): Payer: Medicare Other

## 2021-01-12 ENCOUNTER — Encounter (HOSPITAL_COMMUNITY): Payer: Self-pay | Admitting: Orthopedic Surgery

## 2021-01-12 SURGERY — POSTERIOR LUMBAR FUSION 2 LEVEL
Anesthesia: General | Site: Spine Lumbar

## 2021-01-12 MED ORDER — LIDOCAINE 2% (20 MG/ML) 5 ML SYRINGE
INTRAMUSCULAR | Status: AC
  Filled 2021-01-12: qty 5

## 2021-01-12 MED ORDER — PROPOFOL 10 MG/ML IV BOLUS
INTRAVENOUS | Status: DC | PRN
  Administered 2021-01-12: 100 mg via INTRAVENOUS

## 2021-01-12 MED ORDER — METHYLENE BLUE 0.5 % INJ SOLN
INTRAVENOUS | Status: DC | PRN
  Administered 2021-01-12: 1 mL

## 2021-01-12 MED ORDER — DEXAMETHASONE SODIUM PHOSPHATE 10 MG/ML IJ SOLN
INTRAMUSCULAR | Status: DC | PRN
  Administered 2021-01-12: 10 mg via INTRAVENOUS

## 2021-01-12 MED ORDER — ROCURONIUM BROMIDE 10 MG/ML (PF) SYRINGE
PREFILLED_SYRINGE | INTRAVENOUS | Status: AC
  Filled 2021-01-12: qty 10

## 2021-01-12 MED ORDER — FENTANYL CITRATE (PF) 250 MCG/5ML IJ SOLN
INTRAMUSCULAR | Status: AC
  Filled 2021-01-12: qty 5

## 2021-01-12 MED ORDER — DEXAMETHASONE SODIUM PHOSPHATE 10 MG/ML IJ SOLN
INTRAMUSCULAR | Status: AC
  Filled 2021-01-12: qty 1

## 2021-01-12 MED ORDER — FENTANYL CITRATE (PF) 250 MCG/5ML IJ SOLN
INTRAMUSCULAR | Status: DC | PRN
  Administered 2021-01-12 (×3): 50 ug via INTRAVENOUS
  Administered 2021-01-12: 100 ug via INTRAVENOUS

## 2021-01-12 MED ORDER — 0.9 % SODIUM CHLORIDE (POUR BTL) OPTIME
TOPICAL | Status: DC | PRN
  Administered 2021-01-12: 1000 mL

## 2021-01-12 MED ORDER — HYDROMORPHONE HCL 1 MG/ML IJ SOLN
INTRAMUSCULAR | Status: AC
  Filled 2021-01-12: qty 1

## 2021-01-12 MED ORDER — PROPOFOL 10 MG/ML IV BOLUS
INTRAVENOUS | Status: AC
  Filled 2021-01-12: qty 20

## 2021-01-12 MED ORDER — ARTIFICIAL TEARS OPHTHALMIC OINT
TOPICAL_OINTMENT | OPHTHALMIC | Status: AC
  Filled 2021-01-12: qty 3.5

## 2021-01-12 MED ORDER — THROMBIN 20000 UNITS EX SOLR
CUTANEOUS | Status: DC | PRN
  Administered 2021-01-12: 20000 [IU] via TOPICAL

## 2021-01-12 MED ORDER — ONDANSETRON HCL 4 MG/2ML IJ SOLN
INTRAMUSCULAR | Status: AC
  Filled 2021-01-12: qty 2

## 2021-01-12 MED ORDER — HEMOSTATIC AGENTS (NO CHARGE) OPTIME
TOPICAL | Status: DC | PRN
  Administered 2021-01-12: 1

## 2021-01-12 MED ORDER — BUPIVACAINE LIPOSOME 1.3 % IJ SUSP
INTRAMUSCULAR | Status: DC | PRN
  Administered 2021-01-12: 20 mL

## 2021-01-12 MED ORDER — HYDROMORPHONE HCL 1 MG/ML IJ SOLN
0.2500 mg | INTRAMUSCULAR | Status: DC | PRN
  Administered 2021-01-12 (×2): 0.5 mg via INTRAVENOUS

## 2021-01-12 MED ORDER — BUPIVACAINE-EPINEPHRINE 0.25% -1:200000 IJ SOLN
INTRAMUSCULAR | Status: DC | PRN
  Administered 2021-01-12: 20 mL
  Administered 2021-01-12: 7 mL

## 2021-01-12 MED ORDER — LACTATED RINGERS IV SOLN: INTRAVENOUS | Status: DC | PRN

## 2021-01-12 MED ORDER — ONDANSETRON HCL 4 MG/2ML IJ SOLN
INTRAMUSCULAR | Status: DC | PRN
Start: 2021-01-12 — End: 2021-01-12
  Administered 2021-01-12: 4 mg via INTRAVENOUS

## 2021-01-12 MED ORDER — ARTIFICIAL TEARS OPHTHALMIC OINT
TOPICAL_OINTMENT | OPHTHALMIC | Status: DC | PRN
  Administered 2021-01-12: 1 via OPHTHALMIC

## 2021-01-12 MED ORDER — LIDOCAINE 2% (20 MG/ML) 5 ML SYRINGE
INTRAMUSCULAR | Status: DC | PRN
  Administered 2021-01-12: 80 mg via INTRAVENOUS

## 2021-01-12 MED ORDER — OXYCODONE HCL 5 MG/5ML PO SOLN: 5.0000 mg | Freq: Once | ORAL | Status: DC | PRN

## 2021-01-12 MED ORDER — OXYCODONE HCL 5 MG PO TABS: 5.0000 mg | ORAL_TABLET | Freq: Once | ORAL | Status: DC | PRN

## 2021-01-12 MED ORDER — SUGAMMADEX SODIUM 200 MG/2ML IV SOLN
INTRAVENOUS | Status: DC | PRN
  Administered 2021-01-12: 400 mg via INTRAVENOUS

## 2021-01-12 MED ORDER — ROCURONIUM BROMIDE 10 MG/ML (PF) SYRINGE
PREFILLED_SYRINGE | INTRAVENOUS | Status: DC | PRN
  Administered 2021-01-12: 20 mg via INTRAVENOUS
  Administered 2021-01-12: 80 mg via INTRAVENOUS

## 2021-01-12 MED ORDER — PHENYLEPHRINE HCL-NACL 20-0.9 MG/250ML-% IV SOLN
INTRAVENOUS | Status: DC | PRN
  Administered 2021-01-12: 30 ug/min via INTRAVENOUS

## 2021-01-12 MED ORDER — ONDANSETRON HCL 4 MG/2ML IJ SOLN: 4.0000 mg | Freq: Once | INTRAMUSCULAR | Status: DC | PRN

## 2021-01-12 MED FILL — Thrombin (Recombinant) For Soln 20000 Unit: CUTANEOUS | Qty: 1 | Status: AC

## 2021-01-12 SURGICAL SUPPLY — 84 items
BAG COUNTER SPONGE SURGICOUNT (BAG) ×2 IMPLANT
BENZOIN TINCTURE PRP APPL 2/3 (GAUZE/BANDAGES/DRESSINGS) ×2 IMPLANT
BLADE CLIPPER SURG (BLADE) IMPLANT
BONE VIVIGEN FORMABLE 1.3CC (Bone Implant) ×2 IMPLANT
BUR PRESCISION 1.7 ELITE (BURR) IMPLANT
BUR ROUND FLUTED 5 RND (BURR) ×2 IMPLANT
BUR ROUND PRECISION 4.0 (BURR) IMPLANT
BUR SABER RD CUTTING 3.0 (BURR) IMPLANT
CARTRIDGE OIL MAESTRO DRILL (MISCELLANEOUS) ×2 IMPLANT
CLSR STERI-STRIP ANTIMIC 1/2X4 (GAUZE/BANDAGES/DRESSINGS) ×4 IMPLANT
CNTNR URN SCR LID CUP LEK RST (MISCELLANEOUS) ×1 IMPLANT
CONT SPEC 4OZ STRL OR WHT (MISCELLANEOUS) ×2
COVER SURGICAL LIGHT HANDLE (MISCELLANEOUS) ×2 IMPLANT
DIFFUSER DRILL AIR PNEUMATIC (MISCELLANEOUS) ×4 IMPLANT
DRAIN CHANNEL 15F RND FF W/TCR (WOUND CARE) ×2 IMPLANT
DRAPE C-ARM 42X72 X-RAY (DRAPES) ×2 IMPLANT
DRAPE POUCH INSTRU U-SHP 10X18 (DRAPES) ×2 IMPLANT
DRAPE SURG 17X23 STRL (DRAPES) ×8 IMPLANT
DRSG MEPILEX BORDER 4X12 (GAUZE/BANDAGES/DRESSINGS) IMPLANT
DRSG MEPILEX BORDER 4X8 (GAUZE/BANDAGES/DRESSINGS) IMPLANT
DURAPREP 26ML APPLICATOR (WOUND CARE) ×2 IMPLANT
ELECT BLADE 4.0 EZ CLEAN MEGAD (MISCELLANEOUS) ×2
ELECT CAUTERY BLADE 6.4 (BLADE) ×4 IMPLANT
ELECT REM PT RETURN 9FT ADLT (ELECTROSURGICAL) ×2
ELECTRODE BLDE 4.0 EZ CLN MEGD (MISCELLANEOUS) ×1 IMPLANT
ELECTRODE REM PT RTRN 9FT ADLT (ELECTROSURGICAL) ×1 IMPLANT
EVACUATOR SILICONE 100CC (DRAIN) ×2 IMPLANT
GAUZE 4X4 16PLY ~~LOC~~+RFID DBL (SPONGE) ×4 IMPLANT
GAUZE SPONGE 4X4 12PLY STRL (GAUZE/BANDAGES/DRESSINGS) ×2 IMPLANT
GAUZE SPONGE 4X4 12PLY STRL LF (GAUZE/BANDAGES/DRESSINGS) ×2 IMPLANT
GLOVE SRG 8 PF TXTR STRL LF DI (GLOVE) ×1 IMPLANT
GLOVE SURG ENC MOIS LTX SZ7 (GLOVE) ×2 IMPLANT
GLOVE SURG ENC MOIS LTX SZ8 (GLOVE) ×2 IMPLANT
GLOVE SURG UNDER POLY LF SZ7 (GLOVE) ×2 IMPLANT
GLOVE SURG UNDER POLY LF SZ8 (GLOVE) ×2
GOWN STRL REUS W/ TWL LRG LVL3 (GOWN DISPOSABLE) ×2 IMPLANT
GOWN STRL REUS W/ TWL XL LVL3 (GOWN DISPOSABLE) ×1 IMPLANT
GOWN STRL REUS W/TWL LRG LVL3 (GOWN DISPOSABLE) ×4
GOWN STRL REUS W/TWL XL LVL3 (GOWN DISPOSABLE) ×2
GUIDEWIRE SHARP VIPER II (WIRE) ×18 IMPLANT
IV CATH 14GX2 1/4 (CATHETERS) ×2 IMPLANT
KIT BASIN OR (CUSTOM PROCEDURE TRAY) ×2 IMPLANT
KIT POSITION SURG JACKSON T1 (MISCELLANEOUS) ×2 IMPLANT
KIT TURNOVER KIT B (KITS) ×2 IMPLANT
MARKER SKIN DUAL TIP RULER LAB (MISCELLANEOUS) ×2 IMPLANT
NEEDLE HYPO 25GX1X1/2 BEV (NEEDLE) ×2 IMPLANT
NEEDLE SPNL 18GX3.5 QUINCKE PK (NEEDLE) ×4 IMPLANT
NS IRRIG 1000ML POUR BTL (IV SOLUTION) ×2 IMPLANT
OIL CARTRIDGE MAESTRO DRILL (MISCELLANEOUS) ×4
PACK LAMINECTOMY ORTHO (CUSTOM PROCEDURE TRAY) ×2 IMPLANT
PACK UNIVERSAL I (CUSTOM PROCEDURE TRAY) ×2 IMPLANT
PAD ARMBOARD 7.5X6 YLW CONV (MISCELLANEOUS) ×4 IMPLANT
PATTIES SURGICAL .5 X1 (DISPOSABLE) ×2 IMPLANT
PATTIES SURGICAL .5 X3 (DISPOSABLE) IMPLANT
PATTIES SURGICAL .5X1.5 (GAUZE/BANDAGES/DRESSINGS) ×2 IMPLANT
PATTIES SURGICAL .75X.75 (GAUZE/BANDAGES/DRESSINGS) ×2 IMPLANT
ROD LORDOSED 75MM VIPER 2 (Rod) ×2 IMPLANT
ROD VIPER2 5.5X65MM (Rod) ×2 IMPLANT
SCREW POLY VIPER2 7X40MM (Screw) ×8 IMPLANT
SCREW SET SINGLE INNER MIS (Screw) ×12 IMPLANT
SCREW VIPER X-TAB 6.0X40 (Screw) ×4 IMPLANT
SPONGE INTESTINAL PEANUT (DISPOSABLE) IMPLANT
SPONGE SURGIFOAM ABS GEL 100 (HEMOSTASIS) IMPLANT
STRIP CLOSURE SKIN 1/2X4 (GAUZE/BANDAGES/DRESSINGS) IMPLANT
SURGIFLO W/THROMBIN 8M KIT (HEMOSTASIS) IMPLANT
SUT MNCRL AB 4-0 PS2 18 (SUTURE) ×2 IMPLANT
SUT VIC AB 0 CT1 18XCR BRD 8 (SUTURE) ×2 IMPLANT
SUT VIC AB 0 CT1 8-18 (SUTURE) ×4
SUT VIC AB 1 CT1 18XCR BRD 8 (SUTURE) ×2 IMPLANT
SUT VIC AB 1 CT1 8-18 (SUTURE) ×4
SUT VIC AB 2-0 CT2 18 VCP726D (SUTURE) ×4 IMPLANT
SYR 20ML LL LF (SYRINGE) ×2 IMPLANT
SYR BULB IRRIG 60ML STRL (SYRINGE) ×2 IMPLANT
SYR CONTROL 10ML LL (SYRINGE) ×4 IMPLANT
SYR TB 1ML LUER SLIP (SYRINGE) ×2 IMPLANT
TAP CANN VIPER2 DL 5.0 (TAP) ×2 IMPLANT
TAP CANN VIPER2 DL 6.0 (TAP) ×4 IMPLANT
TAP CANN VIPER2 DL 7.0 (TAP) ×4 IMPLANT
TAPE CLOTH 4X10 WHT NS (GAUZE/BANDAGES/DRESSINGS) ×2 IMPLANT
TOWEL GREEN STERILE (TOWEL DISPOSABLE) ×2 IMPLANT
TOWEL GREEN STERILE FF (TOWEL DISPOSABLE) ×2 IMPLANT
TRAY FOLEY MTR SLVR 16FR STAT (SET/KITS/TRAYS/PACK) ×2 IMPLANT
WATER STERILE IRR 1000ML POUR (IV SOLUTION) ×2 IMPLANT
YANKAUER SUCT BULB TIP NO VENT (SUCTIONS) ×2 IMPLANT

## 2021-01-12 NOTE — Anesthesia Postprocedure Evaluation (Signed)
Anesthesia Post Note  Patient: Kristine [name]  Procedure(s) Performed: LUMBAR 3- LUMBAR 4, LUMBAR 4- LUMBAR 5 DECOMPRESSION AND POSTERIOR SPINAL FUSION WITH INSTRUMENTATION AND ALLOGRAFT (Spine Lumbar)     Patient location during evaluation: PACU Anesthesia Type: General Level of consciousness: awake and alert Pain management: pain level controlled Vital Signs Assessment: post-procedure vital signs reviewed and stable Respiratory status: spontaneous breathing, nonlabored ventilation, respiratory function stable and patient connected to nasal cannula oxygen Cardiovascular status: blood pressure returned to baseline and stable Postop Assessment: no apparent nausea or vomiting Anesthetic complications: no   No notable events documented.  Last Vitals:  Vitals:   01/12/21 1145 01/12/21 1205  BP: 138/63 (!) 155/66  Pulse: (!) 53 (!) 55  Resp: 10 18  Temp: (!) 36.3 C 36.5 C  SpO2: 93% 100%    Last Pain:  Vitals:   01/12/21 1205  TempSrc: Oral  PainSc:                  [name]

## 2021-01-12 NOTE — Transfer of Care (Signed)
Immediate Anesthesia Transfer of Care Note  Patient: Kristine [name]  Procedure(s) Performed: LUMBAR 3- LUMBAR 4, LUMBAR 4- LUMBAR 5 DECOMPRESSION AND POSTERIOR SPINAL FUSION WITH INSTRUMENTATION AND ALLOGRAFT (Spine Lumbar)  Patient Location: PACU  Anesthesia Type:General  Level of Consciousness: awake, alert , oriented and patient cooperative  Airway & Oxygen Therapy: Patient Spontanous Breathing and Patient connected to face mask oxygen  Post-op Assessment: Report given to RN, Post -op Vital signs reviewed and stable and Patient moving all extremities X 4  Post vital signs: Reviewed and stable  Last Vitals:  Vitals Value Taken Time  BP    Temp    Pulse 63 01/12/21 1030  Resp 13 01/12/21 1030  SpO2 99 % 01/12/21 1030  Vitals shown include unvalidated device data.  Last Pain:  Vitals:   01/12/21 0441  TempSrc: Oral  PainSc:       Patients Stated Pain Goal: 3 ([id number])  Complications: No notable events documented.

## 2021-01-12 NOTE — Op Note (Addendum)
PATIENT NAME: Kristine [name]   MEDICAL RECORD NO.:   [id number]    DATE OF BIRTH: 04-16-1941   DATE OF PROCEDURE: 01/12/2021                               OPERATIVE REPORT     PREOPERATIVE DIAGNOSES: 1.  Status post L3/4 and L4-5 lateral lumbar fusion on 01/11/2021, requiring posterior decompression and fusion with instrumentation   POSTOPERATIVE DIAGNOSES:   1.  Status post L3/4 and L4-5 lateral lumbar fusion on 01/11/2021, requiring posterior decompression  and fusion with instrumentation     PROCEDURE (Stage 2): 1. Posterior spinal fusion, L3/4 and L4-5 2. Placement of posterior segmental instrumentation, L3, L4, and L5, bilaterally. 3. Use of morselized allograft - Vivigen. 4. Intraoperative use of floroscopy 5. Posterior decompression L3/4 and L4/5   SURGEON:  Phylliss Bob, MD   ASSISTANT:  Pricilla Holm, PA-C   ANESTHESIA:  General endotracheal anesthesia.   COMPLICATIONS:  None.   DISPOSITION:  Stable.   ESTIMATED BLOOD LOSS:  Minimal   INDICATIONS FOR SURGERY: Briefly, [name] is one day status post a lateral lumbar fusion as noted above.  Please refer to my operative report dated 01/11/2021, for a full account of the patient's preoperative history and indications for surgery.  The patient did present today for stage 2 of what was to be a 2-staged procedure.   OPERATIVE DETAILS:  On 01/12/2021, the patient was brought to surgery and general endotracheal anesthesia was administered.  The patient was placed prone onto a Jackson spinal bed.  The back was then prepped and draped in the usual sterile fashion.  I then made paramedian incisions on the right and left sides, just lateral to the lateral borders of the pedicles at L3-4 and L4-5.  On the left side, the posterolateral gutter and posterior elements at the L3-4 and L4-5 levels were identified and exposed and decorticated. In additional, I did perform a thorough decompression on the left at both the L3/4 and L4/5  levels, by performing a partial facetectomy, thereby decompression the left lateral recesses at both levels. I was very pleased with the decompression that I was able to accomplish.  Vivigen was then packed into the posterolateral gutter on the left side to aid in the success of the posterior fusion.  I then tapped the L3, L4, and L5, pedicles bilaterally using a 6 mm tap.  I then placed 7 x 40 mm screws bilaterally at L4 and L5 bilaterally.  The L3 pedicles however were notably smaller, and at this level, 6 mm screws were placed. Rods were then secured into the tulip heads of the screws bilaterally.  Caps were then placed and a final locking procedure was performed.  I was very pleased with the final AP and lateral fluoroscopic images.  The wound was then copiously irrigated.  On the right and left sides, the fascia was closed using #1 Vicryl.  The subcutaneous layer was closed using 0 Vicryl followed by 2-0 Vicryl, and the skin was then closed using 4-0 Monocryl. Benzoin and Steri-Strips were applied followed by sterile dressing.  All instrument counts were correct at the termination of the procedure.    Of note, Pricilla Holm was my assistant throughout surgery, and did aid in retraction, suctioning, placement of the hardware, and closure for the procedure.       Phylliss Bob, MD

## 2021-01-12 NOTE — H&P (Signed)
Patient tolerated stage 1 of her surgery well yesterday. Will proceed with stage 2 today as scheduled.

## 2021-01-12 NOTE — Anesthesia Procedure Notes (Signed)
Procedure Name: Intubation Date/Time: 01/12/2021 7:50 AM Performed by: Annamary Carolin, CRNA Pre-anesthesia Checklist: Patient identified, Emergency Drugs available, Suction available and Patient being monitored Patient Re-evaluated:Patient Re-evaluated prior to induction Oxygen Delivery Method: Circle System Utilized Preoxygenation: Pre-oxygenation with 100% oxygen Induction Type: IV induction Ventilation: Mask ventilation without difficulty Laryngoscope Size: Mac and 3 Grade View: Grade II Tube type: Oral Tube size: 7.0 mm Number of attempts: 1 Airway Equipment and Method: Stylet and Oral airway Placement Confirmation: ETT inserted through vocal cords under direct vision, positive ETCO2 and breath sounds checked- equal and bilateral Tube secured with: Tape Dental Injury: Teeth and Oropharynx as per pre-operative assessment

## 2021-01-12 NOTE — Progress Notes (Signed)
Patient was evaluated in the recovery room.  She does report moderate low back pain as expected, but denies pain into the right or left legs.  She is noted to have full strength and sensation throughout her bilateral lower extremities.

## 2021-01-13 ENCOUNTER — Encounter (HOSPITAL_COMMUNITY): Payer: Self-pay | Admitting: Orthopedic Surgery

## 2021-01-13 MED ORDER — HYDROCODONE-ACETAMINOPHEN 5-325 MG PO TABS: 1.0000 | ORAL_TABLET | ORAL | 0 refills | Status: DC | PRN

## 2021-01-13 MED ORDER — METHOCARBAMOL 500 MG PO TABS: 500.0000 mg | ORAL_TABLET | Freq: Four times a day (QID) | ORAL | 0 refills | Status: DC | PRN

## 2021-01-13 NOTE — Evaluation (Signed)
Occupational Therapy Evaluation/Discharge Patient Details Name: Kristine [name] MRN: [id number] DOB: [birth year] Today's Date: 01/13/2021    History of Present Illness Pt is an 80 y.o female who presents with progressive back and L LE pain. 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 Pt elected to undergo 2-part surgery. On 8/17, pt underwent L3-4, L4-5 fusion and insertion of interbody device x 2. On 8/18, pt underwent posterior spinal fusion/decompression at L3-4, L4-5, posterior segmental instrumentation placement. PMH: anemia, cataracts, GERD, HTN, Schwannoma, vertigo.   Clinical Impression   PTA, pt lives with spouse and reports Modified Independence with ADLs, mobility and some IADLs in the home though limited by pain. Educated pt on spinal precautions with ADLs/IADLs at home. 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 Pt reports likely improved independence with use of AE at home and can have husband assist as well. Pt able to ambulate in room at Modified Independence with use of RW and denies any DME needs at this time. No further skilled OT services needed at acute level or on DC.    Follow Up Recommendations  No OT follow up    Equipment Recommendations  None recommended by OT    Recommendations for Other Services       Precautions / Restrictions Precautions Precautions: Fall;Back Precaution Booklet Issued: Yes (comment) Required Braces or Orthoses: Spinal Brace Spinal Brace: Thoracolumbosacral orthotic;Applied in sitting position;Applied in standing position Restrictions Weight Bearing Restrictions: No      Mobility Bed Mobility               General bed mobility comments: received up walking in room    Transfers Overall transfer level: Independent Equipment used: None;Rolling walker (2 wheeled)                  Balance Overall balance assessment:  Needs assistance Sitting-balance support: Feet supported;No upper extremity supported Sitting balance-Leahy Scale: Good     Standing balance support: Bilateral upper extremity supported;During functional activity Standing balance-Leahy Scale: Fair Standing balance comment: fair static standing, improved stability in mobility with use of AD                           ADL either performed or assessed with clinical judgement   ADL Overall ADL's : Needs assistance/impaired Eating/Feeding: Independent   Grooming: Modified independent;Standing;Oral care Grooming Details (indicate cue type and reason): after education of spinal precautions         Upper Body Dressing : Minimal assistance;Sitting;Standing Upper Body Dressing Details (indicate cue type and reason): to manage TLSO brace Lower Body Dressing: Minimal assistance;Sit to/from stand Lower Body Dressing Details (indicate cue type and reason): without use of AE, requires assist due to R LE pain/impaired ROM from hip replacement. Pt likely able to complete task independently with use of AE. Educated on use of elastic shoelaces if continued difficulty tying shoes at home Toilet Transfer: Christie;Ambulation;RW             General ADL Comments: Pt noted with balance deficits without AD use, improved with RW use.     Vision Baseline Vision/History: Wears glasses Wears Glasses: At all times Patient Visual Report: No change from baseline Vision Assessment?: No apparent visual deficits     Perception     Praxis      Pertinent Vitals/Pain Pain Assessment:  Faces Faces Pain Scale: Hurts little more Pain Location: back at incision site Pain Descriptors / Indicators: Sore;Other (Comment) (stiff) Pain Intervention(s): Monitored during session;Premedicated before session     Hand Dominance Right   Extremity/Trunk Assessment Upper Extremity Assessment Upper Extremity Assessment: Overall WFL for tasks  assessed   Lower Extremity Assessment Lower Extremity Assessment: Defer to PT evaluation   Cervical / Trunk Assessment Cervical / Trunk Assessment: Normal   Communication Communication Communication: No difficulties   Cognition Arousal/Alertness: Awake/alert Behavior During Therapy: WFL for tasks assessed/performed Overall Cognitive Status: Within Functional Limits for tasks assessed                                     General Comments       Exercises     Shoulder Instructions      Home Living Family/patient expects to be discharged to:: Private residence Living Arrangements: Spouse/significant other Available Help at Discharge: Family;Available 24 hours/day Type of Home: House Home Access: Stairs to enter [employer] of Steps: 4 Entrance Stairs-Rails: Right Home Layout: One level     Bathroom Shower/Tub: Occupational psychologist: Handicapped height (BSC over higher toilet)     Home Equipment: Walker - 2 wheels;Cane - single point;Bedside commode;Shower seat;Hand held shower head;Adaptive equipment Adaptive Equipment: Reacher;Sock aid        Prior Functioning/Environment Level of Independence: Independent with assistive device(s)        Comments: Independent with mobility recently (used RW then cane after hip replacement in Feb 2022). Use of AE for LB dressing since hip replacement. Able to complete some IADLs though limited by pain        OT Problem List:        OT Treatment/Interventions:      OT Goals(Current goals can be found in the care plan section) Acute Rehab OT Goals Patient Stated Goal: heal well, return to normal activities, get back in garden OT Goal Formulation: All assessment and education complete, DC therapy  OT Frequency:     Barriers to D/C:            Co-evaluation              AM-PAC OT "6 Clicks" Daily Activity     Outcome Measure Help from another person eating meals?: None Help  from another person taking care of personal grooming?: None Help from another person toileting, which includes using toliet, bedpan, or urinal?: None Help from another person bathing (including washing, rinsing, drying)?: A Little Help from another person to put on and taking off regular upper body clothing?: None Help from another person to put on and taking off regular lower body clothing?: A Little 6 Click Score: 22   End of Session Equipment Utilized During Treatment: Rolling walker;Back brace Nurse Communication: Mobility status  Activity Tolerance: Patient tolerated treatment well Patient left: with call bell/phone within reach;Other (comment) (up walking in room)  OT Visit Diagnosis: Pain;Other abnormalities of gait and mobility (R26.89) Pain - Right/Left: Right Pain - part of body: Leg;Hip (back)                Time: [id number] OT Time Calculation (min): 41 min Charges:  OT General Charges $OT Visit: 1 Visit OT Evaluation $OT Eval Low Complexity: 1 Low OT Treatments $Self Care/Home Management : 8-22 mins $Therapeutic Activity: 8-22 mins  Malachy Chamber, OTR/L Acute Rehab Services Office: [phone]  Layla Maw 01/13/2021, 7:53 AM

## 2021-01-13 NOTE — Evaluation (Signed)
Physical Therapy Evaluation Patient Details Name: Kristine [name] MRN: [id number] DOB: [birth year] Today's Date: 01/13/2021   History of Present Illness  Pt is an 80 y.o female who presents with progressive back and L LE pain. Pt elected to undergo 2-part surgery. On 8/17, pt underwent L3-4, L4-5 fusion and insertion of interbody device x 2. On 8/18, pt underwent posterior spinal fusion/decompression at L3-4, L4-5, posterior segmental instrumentation placement. PMH: anemia, cataracts, GERD, HTN, Schwannoma, vertigo.  Clinical Impression  Pt admitted with above diagnosis. At the time of PT eval, pt was able to demonstrate transfers and ambulation with up to supervision for safety and RW for support. Pt was educated on precautions, brace application/wearing schedule, appropriate activity progression, and car transfer. Pt currently with functional limitations due to the deficits listed below (see PT Problem List). Pt will benefit from skilled PT to increase their independence and safety with mobility to allow discharge to the venue listed below.      Follow Up Recommendations No PT follow up;Supervision for mobility/OOB    Equipment Recommendations  None recommended by PT    Recommendations for Other Services       Precautions / Restrictions Precautions Precautions: Fall;Back Precaution Booklet Issued: Yes (comment) Required Braces or Orthoses: Spinal Brace Spinal Brace: Thoracolumbosacral orthotic;Applied in sitting position Restrictions Weight Bearing Restrictions: No      Mobility  Bed Mobility Overal bed mobility: Needs Assistance Bed Mobility: Rolling;Sit to Sidelying Rolling: Supervision       Sit to sidelying: Min assist General bed mobility comments: HOB slightly elevated but no use of rails. Increased time and assist to elevate RLE up into bed.    Transfers Overall transfer level: Needs assistance Equipment used: Rolling walker (2 wheeled) Transfers: Sit to/from  Stand Sit to Stand: Supervision         General transfer comment: Supervision and cues for optimal posture and hand placement on seated surface for safety.  Ambulation/Gait Ambulation/Gait assistance: Min Gaffer (Feet): 500 Feet Assistive device: Rolling walker (2 wheeled) Gait Pattern/deviations: Step-through pattern;Decreased stride length;Trunk flexed Gait velocity: Decreased Gait velocity interpretation: 1.31 - 2.62 ft/sec, indicative of limited community ambulator General Gait Details: VC's for improved posture, closer walker proximity, and forward gaze. No assist required and progressed to supervision level by end of gait training.  Stairs Stairs: Yes Stairs assistance: Min guard Stair Management: One rail Right;Step to pattern;Sideways Number of Stairs: 4 General stair comments: VC's for sequencing and safety with sideways negotiation of stairs. Pt was unable to advance either LE up to first step facing forward due to pain/weakness.  Wheelchair Mobility    Modified Rankin (Stroke Patients Only)       Balance Overall balance assessment: Needs assistance Sitting-balance support: Feet supported;No upper extremity supported Sitting balance-Leahy Scale: Fair     Standing balance support: Bilateral upper extremity supported;During functional activity Standing balance-Leahy Scale: Fair Standing balance comment: fair static standing, improved stability in mobility with use of AD                             Pertinent Vitals/Pain Pain Assessment: Faces Faces Pain Scale: Hurts little more Pain Location: back at incision site Pain Descriptors / Indicators: Sore;Other (Comment) (stiff) Pain Intervention(s): Limited activity within patient's tolerance;Monitored during session;Repositioned    Home Living Family/patient expects to be discharged to:: Private residence Living Arrangements: Spouse/significant other Available Help at  Discharge: Family;Available 24 hours/day Type of Home: House  Home Access: Stairs to enter Entrance Stairs-Rails: Right Entrance Stairs-Number of Steps: 4 Home Layout: One level Home Equipment: Walker - 2 wheels;Cane - single point;Bedside commode;Shower seat;Hand held shower head;Adaptive equipment      Prior Function Level of Independence: Independent with assistive device(s)         Comments: Independent with mobility recently (used RW then cane after hip replacement in Feb 2022). Use of AE for LB dressing since hip replacement. Able to complete some IADLs though limited by pain     Hand Dominance   Dominant Hand: Right    Extremity/Trunk Assessment   Upper Extremity Assessment Upper Extremity Assessment: Defer to OT evaluation    Lower Extremity Assessment Lower Extremity Assessment: RLE deficits/detail;LLE deficits/detail RLE Deficits / Details: Bilateral weakness consistent with pre-op diagnosis. R slightly weaker than L.    Cervical / Trunk Assessment Cervical / Trunk Assessment: Other exceptions Cervical / Trunk Exceptions: s/p surgery  Communication   Communication: No difficulties  Cognition Arousal/Alertness: Awake/alert Behavior During Therapy: WFL for tasks assessed/performed Overall Cognitive Status: Within Functional Limits for tasks assessed                                        General Comments      Exercises     Assessment/Plan    PT Assessment Patient needs continued PT services  PT Problem List Decreased strength;Decreased activity tolerance;Decreased balance;Decreased mobility;Decreased knowledge of use of DME;Decreased safety awareness;Decreased knowledge of precautions;Pain       PT Treatment Interventions DME instruction;Gait training;Stair training;Functional mobility training;Therapeutic activities;Therapeutic exercise;Neuromuscular re-education;Patient/family education    PT Goals (Current goals can be found in the  Care Plan section)  Acute Rehab PT Goals Patient Stated Goal: heal well, return to normal activities, get back in garden PT Goal Formulation: With patient Time For Goal Achievement: 01/20/21 Potential to Achieve Goals: Good    Frequency Min 5X/week   Barriers to discharge        Co-evaluation               AM-PAC PT "6 Clicks" Mobility  Outcome Measure Help needed turning from your back to your side while in a flat bed without using bedrails?: None Help needed moving from lying on your back to sitting on the side of a flat bed without using bedrails?: A Little Help needed moving to and from a bed to a chair (including a wheelchair)?: A Little Help needed standing up from a chair using your arms (e.g., wheelchair or bedside chair)?: A Little Help needed to walk in hospital room?: A Little Help needed climbing 3-5 steps with a railing? : A Little 6 Click Score: 19    End of Session Equipment Utilized During Treatment: Gait belt;Back brace Activity Tolerance: Patient tolerated treatment well Patient left: in bed;with call bell/phone within reach Nurse Communication: Mobility status PT Visit Diagnosis: Unsteadiness on feet (R26.81);Pain Pain - part of body:  (back)    Time: [id number] PT Time Calculation (min) (ACUTE ONLY): 34 min   Charges:   PT Evaluation $PT Eval Low Complexity: 1 Low PT Treatments $Gait Training: 8-22 mins        Rolinda Roan, PT, DPT Acute Rehabilitation Services Pager: [phone] Office: [phone]   Thelma Comp 01/13/2021, 10:12 AM

## 2021-01-13 NOTE — Progress Notes (Signed)
Patient was transported via wheelchair by volunteer for discharge home; in no acute distress nor complaints of pain nor discomfort; room was checked and accounted for all her belongings; discharge instructions given to patient by RN and she verbalized understanding on the instructions given.

## 2021-01-16 LAB — TYPE AND SCREEN
ABO/RH(D): A NEG
Antibody Screen: NEGATIVE
Donor AG Type: NEGATIVE
Donor AG Type: NEGATIVE
Unit division: 0
Unit division: 0

## 2021-01-16 LAB — BPAM RBC
Blood Product Expiration Date: 202209102359
Blood Product Expiration Date: 202209102359
Unit Type and Rh: 600
Unit Type and Rh: 600

## 2021-01-16 MED FILL — Sodium Chloride IV Soln 0.9%: INTRAVENOUS | Qty: 1000 | Status: AC

## 2021-01-16 MED FILL — Heparin Sodium (Porcine) Inj 1000 Unit/ML: INTRAMUSCULAR | Qty: 30 | Status: AC

## 2021-01-17 ENCOUNTER — Encounter (HOSPITAL_COMMUNITY): Payer: Self-pay | Admitting: Orthopedic Surgery

## 2021-01-26 NOTE — Discharge Summary (Signed)
Patient ID: Kristine [name] MRN: [id number] DOB/AGE: Aug 27, 1940 80 y.o.  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 female who was admitted 01/11/2021 for operative treatment of <principal problem not specified>. Patient has severe unremitting pain that affects sleep, daily activities, and work/hobbies. After pre-op clearance the patient was taken to the operating room on 01/12/2021 and underwent  Procedure(s): LUMBAR 3- LUMBAR 4, LUMBAR 4- LUMBAR 5 DECOMPRESSION AND POSTERIOR SPINAL FUSION WITH INSTRUMENTATION AND ALLOGRAFT.    Patient was given perioperative antibiotics:  Anti-infectives (From admission, onward)    Start     Dose/Rate Route Frequency Ordered Stop   01/12/21 0600  vancomycin (VANCOREADY) IVPB 1500 mg/300 mL        1,500 mg 150 mL/hr over 120 Minutes Intravenous On call to O.R. 01/11/21 1947 01/12/21 0928   01/11/21 2000  vancomycin (VANCOCIN) IVPB 1000 mg/200 mL premix        1,000 mg 200 mL/hr over 60 Minutes Intravenous  Once 01/11/21 1355 01/11/21 1942   01/11/21 0645  vancomycin (VANCOCIN) IVPB 1000 mg/200 mL premix        1,000 mg 200 mL/hr over 60  Minutes Intravenous On call to O.R. 01/11/21 [id number] 01/11/21 0831        Patient was given sequential compression devices, early ambulation to prevent DVT.  Patient benefited maximally from hospital stay and there were no complications.    Recent vital signs: BP (!) 160/58 (BP Location: Right Arm)   Pulse 61   Temp 97.6 F (36.4 C) (Oral)   Resp 20   Ht '5\' 6"'$  (1.676 m)   Wt 98.9 kg   SpO2 99%   BMI 35.19 kg/m    Discharge Medications:   Allergies as of 01/13/2021       Reactions   Corn-containing Products Diarrhea   Stomach ache   Influenza Vaccines    flushing   Meperidine Hcl Other (See Comments)   (Demerol) Flushing        Medication List     TAKE these medications    amoxicillin 500 MG capsule Commonly known as: AMOXIL Take 500 mg by mouth See admin instructions. Take 2000 mg by mouth 1 hour prior to dental procedures   colchicine 0.6 MG tablet Take 1 tablet (0.6 mg total) by mouth 2 (two) times daily as needed (for gout flare).   cyanocobalamin 2000 MCG tablet Take 2,000 mcg by mouth daily.   fluticasone 50 MCG/ACT nasal spray Commonly known  as: FLONASE Place 2 sprays into both nostrils daily as needed for allergies.   folic acid 1 MG tablet Commonly known as: FOLVITE Take 1 mg by mouth daily.   gabapentin 600 MG tablet Commonly known as: NEURONTIN Take 600 mg by mouth 3 (three) times daily.   hydrochlorothiazide 25 MG tablet Commonly known as: HYDRODIURIL TAKE 1 TABLET BY MOUTH EVERY DAY   HYDROcodone-acetaminophen 5-325 MG tablet Commonly known as: NORCO/VICODIN Take 1-2 tablets by mouth every 6 (six) hours as needed for moderate pain or severe pain. What changed: Another medication with the same name was added. Make sure you understand how and when to take each.   HYDROcodone-acetaminophen 5-325 MG tablet Commonly known as: NORCO/VICODIN Take 1 tablet by mouth every 4 (four) hours as needed for moderate pain ((score 4 to 6)). What changed:  You were already taking a medication with the same name, and this prescription was added. Make sure you understand how and when to take each.   levothyroxine 75 MCG tablet Commonly known as: Synthroid TAKE 1 TABLET DAILY BEFORE BREAKFAST What changed:  how much to take how to take this when to take this   methocarbamol 500 MG tablet Commonly known as: ROBAXIN Take 1 tablet (500 mg total) by mouth every 6 (six) hours as needed for muscle spasms. What changed: Another medication with the same name was added. Make sure you understand how and when to take each.   methocarbamol 500 MG tablet Commonly known as: ROBAXIN Take 1 tablet (500 mg total) by mouth every 6 (six) hours as needed for muscle spasms. What changed: You were already taking a medication with the same name, and this prescription was added. Make sure you understand how and when to take each.   methotrexate 2.5 MG tablet Commonly known as: RHEUMATREX Take 10 mg by mouth every Monday.   montelukast 10 MG tablet Commonly known as: SINGULAIR TAKE 1 TABLET AT BEDTIME   omeprazole 20 MG capsule Commonly known as: PRILOSEC Take 20 mg by mouth daily.   polyethylene glycol 17 g packet Commonly known as: MIRALAX / GLYCOLAX Take 17 g by mouth daily.   Premarin 0.625 MG tablet Generic drug: estrogens (conjugated) TAKE 1 TABLET DAILY What changed:  how much to take when to take this   simvastatin 40 MG tablet Commonly known as: ZOCOR TAKE 1 TABLET ('40MG'$ ) BY MOUTH ONCE IN THE EVENING   solifenacin 5 MG tablet Commonly known as: VESICARE Take 1 tablet (5 mg total) by mouth daily. Pt has not started as of 07/04/2020   traMADol 50 MG tablet Commonly known as: ULTRAM Take 1-2 tablets (50-100 mg total) by mouth every 6 (six) hours as needed for moderate pain.   Triple Antibiotic Pain Relief 1 % Oint Generic drug: Neomy-Bacit-Polymyx-Pramoxine Apply 1 application topically as needed (wound care).   trolamine salicylate  10 % cream Commonly known as: ASPERCREME Apply 1 application topically at bedtime. Applied to feet at night   valsartan 160 MG tablet Commonly known as: DIOVAN TAKE 1 TABLET BY MOUTH EVERY DAY        Diagnostic Studies: DG Lumbar Spine 2-3 Views  Result Date: 01/12/2021 CLINICAL DATA:  Surgery, elective Z41.9 (ICD-10-CM) additional history provided by technologist: Lumbar 3-lumbar 4, lumbar 4-lumbar 5 decompression and posterior spinal fusion with instrumentation and allograft. Provided fluoroscopy time 316.0 seconds (388.79 mGy). EXAM: LUMBAR SPINE - 2-3 VIEW; DG C-ARM 1-60 MIN COMPARISON:  Intraoperative fluoroscopic images 01/11/2021. MRI of the lumbar spine 10/28/2020.  FINDINGS: AP and lateral view intraoperative fluoroscopic images of the lumbar spine are submitted, 2 images total. The lowest well-formed intervertebral disc space is designated L5-S1. On the provided images, a posterior spinal fusion construct spans the L3-L5 levels (bilateral pedicle screws and vertical interconnecting rods). Interbody devices at L3-L4 and L4-L5. IMPRESSION: Two intraoperative fluoroscopic images of the lumbar spine from L3-L5 posterior fusion, as described. Electronically Signed   By: Kellie Simmering D.O.   On: 01/12/2021 10:07   DG Lumbar Spine 2-3 Views  Result Date: 01/11/2021 CLINICAL DATA:  L3-4 and L4-5 decompression and fusion. Evaluate for retained instrument. EXAM: LUMBAR SPINE - 2-3 VIEW; DG C-ARM 1-60 MIN FINDINGS: Frontal and lateral spot fluoro images of the lumbar spine are submitted. Interbody graft material noted at 2 lumbar levels, reportedly L3-4 and L4-5. Monitoring leads are noted on the AP projection down around the L5 superior endplate, confirmed with OR staff. No evidence for unexpected retained radiopaque soft tissue foreign body. IMPRESSION: No findings to suggest unexpected retained foreign body. I personally called these results to [facility], in the OR, at approximately 1143 hours on  01/11/2021. Electronically Signed   By: Misty Stanley M.D.   On: 01/11/2021 11:44   DG C-Arm 1-60 Min  Result Date: 01/12/2021 CLINICAL DATA:  Surgery, elective Z41.9 (ICD-10-CM) additional history provided by technologist: Lumbar 3-lumbar 4, lumbar 4-lumbar 5 decompression and posterior spinal fusion with instrumentation and allograft. Provided fluoroscopy time 316.0 seconds (388.79 mGy). EXAM: LUMBAR SPINE - 2-3 VIEW; DG C-ARM 1-60 MIN COMPARISON:  Intraoperative fluoroscopic images 01/11/2021. MRI of the lumbar spine 10/28/2020. FINDINGS: AP and lateral view intraoperative fluoroscopic images of the lumbar spine are submitted, 2 images total. The lowest well-formed intervertebral disc space is designated L5-S1. On the provided images, a posterior spinal fusion construct spans the L3-L5 levels (bilateral pedicle screws and vertical interconnecting rods). Interbody devices at L3-L4 and L4-L5. IMPRESSION: Two intraoperative fluoroscopic images of the lumbar spine from L3-L5 posterior fusion, as described. Electronically Signed   By: Kellie Simmering D.O.   On: 01/12/2021 10:07   DG C-Arm 1-60 Min  Result Date: 01/11/2021 CLINICAL DATA:  L3-4 and L4-5 decompression and fusion. Evaluate for retained instrument. EXAM: LUMBAR SPINE - 2-3 VIEW; DG C-ARM 1-60 MIN FINDINGS: Frontal and lateral spot fluoro images of the lumbar spine are submitted. Interbody graft material noted at 2 lumbar levels, reportedly L3-4 and L4-5. Monitoring leads are noted on the AP projection down around the L5 superior endplate, confirmed with OR staff. No evidence for unexpected retained radiopaque soft tissue foreign body. IMPRESSION: No findings to suggest unexpected retained foreign body. I personally called these results to [facility], in the OR, at approximately 1143 hours on 01/11/2021. Electronically Signed   By: Misty Stanley M.D.   On: 01/11/2021 11:44   MR BRAIN/IAC W WO CONTRAST  Result Date: 12/29/2020 CLINICAL DATA:  Acoustic  neuroma. EXAM: MRI HEAD WITHOUT AND WITH CONTRAST TECHNIQUE: Multiplanar, multiecho pulse sequences of the brain and surrounding structures were obtained without and with intravenous contrast. CONTRAST:  29m MULTIHANCE GADOBENATE DIMEGLUMINE 529 MG/ML IV SOLN COMPARISON:  02/26/2019 FINDINGS: Brain: There is no evidence of an acute infarct, midline shift, or extra-axial fluid collection. Mild cerebral atrophy is within normal limits for age. There is a single new chronic microhemorrhage in the right temporal lobe, nonspecific in isolation. T2 hyperintensities in the cerebral white matter similar to the prior study and are nonspecific but compatible with mild chronic small vessel ischemic disease. 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 A 13 x 4 mm enhancing extra-axial mass projecting leftward from the falx in the posterior frontal region is unchanged. Dedicated imaging was performed through the internal auditory canals and demonstrates a normal appearance of the seventh and eighth cranial nerve complex on the right. A 3 mm enhancing mass in the left internal auditory canal is unchanged. Vascular: Major intracranial vascular flow voids are preserved. Skull and upper cervical spine: No suspicious marrow lesion. Advanced cervical facet arthrosis with multilevel degenerative anterolisthesis. Sinuses/Orbits: Persistent near complete opacification of the right maxillary sinus in part due to a large mucous retention cyst. Mild mucosal thickening in the left maxillary and bilateral ethmoid sinuses. Clear mastoid air cells. Other: None. IMPRESSION: 1. 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 2. 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 Electronically Signed   By: Logan Bores M.D.   On: 12/29/2020 17:12    Disposition: Discharge disposition: 01-Home or Self Care       Discharge Instructions     Discharge patient   Complete by: As  directed    Discharge disposition: 01-Home or Self Care   Discharge patient date: 01/13/2021      Pt doing well PO day 1 S/P L3-5 LAT/POST fusion  -Scripts for pain sent to pharmacy electronically  -D/C instructions sheet printed and in chart -D/C today  -F/U in office 2 weeks   Signed: Lennie Muckle Amaar Oshita 01/26/2021, 3:22 PM

## 2021-02-16 NOTE — Assessment & Plan Note (Signed)
She has good cardiac fitness but is limited due to back and joints.  No SOB, no CP.  4 and 10 METs = Can do heavy work around the house, such as scrubbing floors or lifting or moving heavy furniture, or climbing two flights of stairs (between)  Cleared for upcoming surgery with low to moderate  Risk.

## 2021-02-16 NOTE — Assessment & Plan Note (Signed)
Stable, chronic.  Continue current medication.   LDL at goal on 40 mg simvastatin.

## 2021-02-16 NOTE — Assessment & Plan Note (Addendum)
Body mass index is 36.05 kg/m.    Can look into Wegovy  (semaglutide) after surgery to continue working on weight loss.

## 2021-02-16 NOTE — Assessment & Plan Note (Addendum)
Stable, chronic.  Continue current medication.    Diovan HCTZ

## 2021-02-16 NOTE — Assessment & Plan Note (Signed)
Stable, chronic.  Continue current medication.   Levothyroxine 75 mcg daily

## 2021-03-06 NOTE — Telephone Encounter (Signed)
Left a message on voicemail for patient to call the office back. Will send message to Dr. Diona Browner in the meantime.

## 2021-03-15 ENCOUNTER — Other Ambulatory Visit: Payer: Self-pay | Admitting: Family Medicine

## 2021-05-03 ENCOUNTER — Encounter: Payer: Self-pay | Admitting: Family Medicine

## 2021-05-04 ENCOUNTER — Other Ambulatory Visit: Payer: Self-pay | Admitting: Family Medicine

## 2021-05-08 ENCOUNTER — Other Ambulatory Visit: Payer: Self-pay | Admitting: Family Medicine

## 2021-06-01 ENCOUNTER — Other Ambulatory Visit: Payer: Self-pay | Admitting: Family Medicine

## 2021-06-09 ENCOUNTER — Encounter: Payer: Self-pay | Admitting: Family Medicine

## 2021-06-09 ENCOUNTER — Other Ambulatory Visit: Payer: Self-pay | Admitting: Family Medicine

## 2021-06-09 DIAGNOSIS — Z1231 Encounter for screening mammogram for malignant neoplasm of breast: Secondary | ICD-10-CM

## 2021-06-09 DIAGNOSIS — E348 Other specified endocrine disorders: Secondary | ICD-10-CM

## 2021-06-16 ENCOUNTER — Ambulatory Visit
Admission: RE | Admit: 2021-06-16 | Discharge: 2021-06-16 | Disposition: A | Discharge status: Home / Self Care | Payer: Federal, State, Local not specified - PPO | Source: Ambulatory Visit | Attending: Family Medicine | Admitting: Family Medicine

## 2021-06-16 ENCOUNTER — Other Ambulatory Visit (INDEPENDENT_AMBULATORY_CARE_PROVIDER_SITE_OTHER): Payer: Federal, State, Local not specified - PPO

## 2021-06-16 ENCOUNTER — Telehealth: Payer: Self-pay | Admitting: Family Medicine

## 2021-06-16 ENCOUNTER — Other Ambulatory Visit: Payer: Self-pay

## 2021-06-16 DIAGNOSIS — E039 Hypothyroidism, unspecified: Secondary | ICD-10-CM | POA: Diagnosis not present

## 2021-06-16 DIAGNOSIS — D509 Iron deficiency anemia, unspecified: Secondary | ICD-10-CM

## 2021-06-16 DIAGNOSIS — E7800 Pure hypercholesterolemia, unspecified: Secondary | ICD-10-CM | POA: Diagnosis not present

## 2021-06-16 DIAGNOSIS — R7303 Prediabetes: Secondary | ICD-10-CM

## 2021-06-16 DIAGNOSIS — E348 Other specified endocrine disorders: Secondary | ICD-10-CM

## 2021-06-16 LAB — COMPREHENSIVE METABOLIC PANEL
ALT: 9 U/L (ref 0–35)
AST: 10 U/L (ref 0–37)
Albumin: 4 g/dL (ref 3.5–5.2)
Alkaline Phosphatase: 52 U/L (ref 39–117)
BUN: 20 mg/dL (ref 6–23)
CO2: 28 mEq/L (ref 19–32)
Calcium: 9 mg/dL (ref 8.4–10.5)
Chloride: 104 mEq/L (ref 96–112)
Creatinine, Ser: 0.76 mg/dL (ref 0.40–1.20)
GFR: 73.48 mL/min (ref >60.00)
Glucose, Bld: 112 mg/dL — ABNORMAL HIGH (ref 70–99)
Potassium: 3.6 mEq/L (ref 3.5–5.1)
Sodium: 139 mEq/L (ref 135–145)
Total Bilirubin: 0.7 mg/dL (ref 0.2–1.2)
Total Protein: 6.3 g/dL (ref 6.0–8.3)

## 2021-06-16 LAB — CBC WITH DIFFERENTIAL/PLATELET
Basophils Absolute: 0 10*3/uL (ref 0.0–0.1)
Basophils Relative: 0.7 % (ref 0.0–3.0)
Eosinophils Absolute: 0.2 10*3/uL (ref 0.0–0.7)
Eosinophils Relative: 4 % (ref 0.0–5.0)
HCT: 38.4 % (ref 36.0–46.0)
Hemoglobin: 12.8 g/dL (ref 12.0–15.0)
Lymphocytes Relative: 33.7 % (ref 12.0–46.0)
Lymphs Abs: 1.8 10*3/uL (ref 0.7–4.0)
MCHC: 33.3 g/dL (ref 30.0–36.0)
MCV: 84.6 fl (ref 78.0–100.0)
Monocytes Absolute: 0.4 10*3/uL (ref 0.1–1.0)
Monocytes Relative: 7.2 % (ref 3.0–12.0)
Neutro Abs: 2.9 10*3/uL (ref 1.4–7.7)
Neutrophils Relative %: 54.4 % (ref 43.0–77.0)
Platelets: 194 10*3/uL (ref 150.0–400.0)
RBC: 4.54 Mil/uL (ref 3.87–5.11)
RDW: 14.7 % (ref 11.5–15.5)
WBC: 5.4 10*3/uL (ref 4.0–10.5)

## 2021-06-16 LAB — LIPID PANEL
Cholesterol: 192 mg/dL (ref 0–200)
HDL: 64.6 mg/dL (ref >39.00)
LDL Cholesterol: 94 mg/dL (ref 0–99)
NonHDL: 126.9
Total CHOL/HDL Ratio: 3
Triglycerides: 167 mg/dL — ABNORMAL HIGH (ref 0.0–149.0)
VLDL: 33.4 mg/dL (ref 0.0–40.0)

## 2021-06-16 LAB — TSH: TSH: 4.7 u[IU]/mL (ref 0.35–5.50)

## 2021-06-16 LAB — HEMOGLOBIN A1C: Hgb A1c MFr Bld: 5.9 % (ref 4.6–6.5)

## 2021-06-16 LAB — T3, FREE: T3, Free: 3 pg/mL (ref 2.3–4.2)

## 2021-06-16 LAB — T4, FREE: Free T4: 1.09 ng/dL (ref 0.60–1.60)

## 2021-06-16 NOTE — Progress Notes (Signed)
No critical labs need to be addressed urgently. We will discuss labs in detail at upcoming office visit.   

## 2021-06-16 NOTE — Telephone Encounter (Signed)
-----   Message from Ellamae Sia sent at 05/31/2021 11:02 AM EST ----- Regarding: Lab orders for Friday, 1.20.23 Patient is scheduled for CPX labs, please order future labs, Thanks , Karna Christmas

## 2021-06-23 ENCOUNTER — Ambulatory Visit (INDEPENDENT_AMBULATORY_CARE_PROVIDER_SITE_OTHER): Payer: Federal, State, Local not specified - PPO | Admitting: Family Medicine

## 2021-06-23 ENCOUNTER — Other Ambulatory Visit: Payer: Self-pay

## 2021-06-23 VITALS — BP 140/70 | HR 63 | Temp 98.0°F | Ht 65.5 in | Wt 219.0 lb

## 2021-06-23 DIAGNOSIS — N3941 Urge incontinence: Secondary | ICD-10-CM

## 2021-06-23 DIAGNOSIS — E039 Hypothyroidism, unspecified: Secondary | ICD-10-CM | POA: Diagnosis not present

## 2021-06-23 DIAGNOSIS — Z6836 Body mass index (BMI) 36.0-36.9, adult: Secondary | ICD-10-CM

## 2021-06-23 DIAGNOSIS — D509 Iron deficiency anemia, unspecified: Secondary | ICD-10-CM

## 2021-06-23 DIAGNOSIS — I10 Essential (primary) hypertension: Secondary | ICD-10-CM

## 2021-06-23 DIAGNOSIS — E7800 Pure hypercholesterolemia, unspecified: Secondary | ICD-10-CM

## 2021-06-23 DIAGNOSIS — Z0000 Encounter for general adult medical examination without abnormal findings: Secondary | ICD-10-CM | POA: Diagnosis not present

## 2021-06-23 DIAGNOSIS — G609 Hereditary and idiopathic neuropathy, unspecified: Secondary | ICD-10-CM

## 2021-06-23 MED ORDER — VALSARTAN 160 MG PO TABS: 160.0000 mg | ORAL_TABLET | Freq: Every day | ORAL | 3 refills | Status: DC

## 2021-06-23 MED ORDER — HYDROCHLOROTHIAZIDE 25 MG PO TABS: 25.0000 mg | ORAL_TABLET | Freq: Every day | ORAL | 3 refills | Status: DC

## 2021-06-23 MED ORDER — ESTROGENS CONJUGATED 0.625 MG PO TABS: 0.6250 mg | ORAL_TABLET | Freq: Every evening | ORAL | 1 refills | Status: DC

## 2021-06-23 MED ORDER — GABAPENTIN 600 MG PO TABS: 600.0000 mg | ORAL_TABLET | Freq: Three times a day (TID) | ORAL | 1 refills | Status: DC

## 2021-06-23 NOTE — Progress Notes (Signed)
Patient ID: Kristine [name], female    DOB: 11/16/1940, 81 y.o.   MRN: [id number]  This visit was conducted in person.  BP 140/70    Pulse 63    Temp 98 F (36.7 C) (Temporal)    Ht 5' 5.5" (1.664 m)    Wt 219 lb (99.3 kg)    SpO2 99%    BMI 35.89 kg/m    CC:  Chief Complaint  Patient presents with   Annual Exam    Subjective:   HPI: Kristine [name] is a 81 y.o. female presenting on 06/23/2021 for Annual Exam The patient presents for  complete physical and review of chronic health problems. He/She also has the following acute concerns today: After spinal fusion.. her back pain is improved, Still with some leg pain. She is still using gabapentin 600 mg 3 times daily.  Only using hydrocodone off and on.  Not using tramadol. Hypertension:   Improved control on  HCTZ 25 mg daily and valsartan160 mg daily BP Readings from Last 3 Encounters:  06/23/21 140/70  01/13/21 (!) 160/58  01/06/21 (!) 158/69  Using medication without problems or lightheadedness: none Chest pain with exertion: none Edema:none Short of breath:none Average home BPs: 134/76 Other issues:  Prediabetes  Lab Results  Component Value Date   HGBA1C 5.9 06/16/2021    Elevated Cholesterol:  A goal on  simvastatin 40 mg daily Lab Results  Component Value Date   CHOL 192 06/16/2021   HDL 64.60 06/16/2021   LDLCALC 94 06/16/2021   LDLDIRECT 105.0 06/14/2020   TRIG 167.0 (H) 06/16/2021   CHOLHDL 3 06/16/2021  Using medications without problems: Muscle aches: none Diet compliance:none Exercise: She is doing well in PT following spinal fusion... walking 1 hours every few days. Other complaints:  OAB,  was controlled on vesicare 5 mg daily. She has had some increase in bladder spasm  lately in last 6-8 weeks.  Hypothyroidism  Stable control on levo 75 mcg daily. Lab Results  Component Value Date   TSH 4.70 06/16/2021     Obesity:  Wt Readings from Last 3 Encounters:  06/23/21 219 lb (99.3 kg)   01/11/21 218 lb (98.9 kg)  01/06/21 218 lb 3.2 oz (99 kg)         Relevant past medical, surgical, family and social history reviewed and updated as indicated. Interim medical history since our last visit reviewed. Allergies and medications reviewed and updated. Outpatient Medications Prior to Visit  Medication Sig Dispense Refill   amoxicillin (AMOXIL) 500 MG capsule Take 500 mg by mouth See admin instructions. Take 2000 mg by mouth 1 hour prior to dental procedures     colchicine 0.6 MG tablet Take 1 tablet (0.6 mg total) by mouth 2 (two) times daily as needed (for gout flare). 90 tablet 1   cyanocobalamin 2000 MCG tablet Take 2,000 mcg by mouth daily.     estrogens, conjugated, (PREMARIN) 0.625 MG tablet Take 1 tablet (0.625 mg total) by mouth every evening. 90 tablet 0   fexofenadine (ALLEGRA ALLERGY) 180 MG tablet Take 180 mg by mouth as needed for allergies or rhinitis.     fluticasone (FLONASE) 50 MCG/ACT nasal spray Place 2 sprays into both nostrils daily as needed for allergies.     folic acid (FOLVITE) 1 MG tablet Take 1 mg by mouth daily.     gabapentin (NEURONTIN) 600 MG tablet Take 600 mg by mouth 3 (three) times daily.     hydrochlorothiazide (  HYDRODIURIL) 25 MG tablet TAKE 1 TABLET BY MOUTH EVERY DAY 90 tablet 0   HYDROcodone-acetaminophen (NORCO/VICODIN) 5-325 MG tablet Take 1 tablet by mouth every 4 (four) hours as needed for moderate pain ((score 4 to 6)). 30 tablet 0   levothyroxine (SYNTHROID) 75 MCG tablet TAKE 1 TABLET DAILY BEFORE BREAKFAST 90 tablet 3   methocarbamol (ROBAXIN) 500 MG tablet Take 1 tablet (500 mg total) by mouth every 6 (six) hours as needed for muscle spasms. 60 tablet 0   methotrexate (RHEUMATREX) 2.5 MG tablet Take 10 mg by mouth every Monday.     montelukast (SINGULAIR) 10 MG tablet TAKE 1 TABLET AT BEDTIME (Patient taking differently: Take 10 mg by mouth as needed.) 90 tablet 0   Neomy-Bacit-Polymyx-Pramoxine (TRIPLE ANTIBIOTIC PAIN RELIEF) 1 %  OINT Apply 1 application topically as needed (wound care).     Omega-3 Fatty Acids (FISH OIL) 1200 MG CAPS Take 2,400 mg by mouth 2 (two) times daily.     omeprazole (PRILOSEC) 20 MG capsule Take 20 mg by mouth daily.     polyethylene glycol (MIRALAX / GLYCOLAX) packet Take 17 g by mouth daily.     simvastatin (ZOCOR) 40 MG tablet TAKE 1 TABLET (40MG ) BY MOUTH ONCE IN THE EVENING 90 tablet 1   solifenacin (VESICARE) 5 MG tablet Take 1 tablet (5 mg total) by mouth daily. Pt has not started as of 07/04/2020 90 tablet 3   traMADol (ULTRAM) 50 MG tablet Take 1-2 tablets (50-100 mg total) by mouth every 6 (six) hours as needed for moderate pain. 40 tablet 0   trolamine salicylate (ASPERCREME) 10 % cream Apply 1 application topically at bedtime. Applied to feet at night     valsartan (DIOVAN) 160 MG tablet TAKE 1 TABLET BY MOUTH EVERY DAY 90 tablet 3   HYDROcodone-acetaminophen (NORCO/VICODIN) 5-325 MG tablet Take 1-2 tablets by mouth every 6 (six) hours as needed for moderate pain or severe pain. 42 tablet 0   methocarbamol (ROBAXIN) 500 MG tablet Take 1 tablet (500 mg total) by mouth every 6 (six) hours as needed for muscle spasms. 40 tablet 0   No facility-administered medications prior to visit.     Per HPI unless specifically indicated in ROS section below Review of Systems  Constitutional:  Negative for fatigue and fever.  HENT:  Negative for congestion.   Eyes:  Negative for pain.  Respiratory:  Negative for cough and shortness of breath.   Cardiovascular:  Negative for chest pain, palpitations and leg swelling.  Gastrointestinal:  Negative for abdominal pain.  Genitourinary:  Negative for dysuria and vaginal bleeding.  Musculoskeletal:  Negative for back pain.  Neurological:  Negative for syncope, light-headedness and headaches.  Psychiatric/Behavioral:  Negative for dysphoric mood.   Objective:  BP 140/70    Pulse 63    Temp 98 F (36.7 C) (Temporal)    Ht 5' 5.5" (1.664 m)    Wt 219 lb  (99.3 kg)    SpO2 99%    BMI 35.89 kg/m   Wt Readings from Last 3 Encounters:  06/23/21 219 lb (99.3 kg)  01/11/21 218 lb (98.9 kg)  01/06/21 218 lb 3.2 oz (99 kg)      Physical Exam Vitals and nursing note reviewed.  Constitutional:      General: She is not in acute distress.    Appearance: Normal appearance. She is well-developed. She is not ill-appearing or toxic-appearing.  HENT:     Head: Normocephalic.     Right  Ear: Hearing, tympanic membrane, ear canal and external ear normal.     Left Ear: Hearing, tympanic membrane, ear canal and external ear normal.     Nose: Nose normal.  Eyes:     General: Lids are normal. Lids are everted, no foreign bodies appreciated.     Conjunctiva/sclera: Conjunctivae normal.     Pupils: Pupils are equal, round, and reactive to light.  Neck:     Thyroid: No thyroid mass or thyromegaly.     Vascular: No carotid bruit.     Trachea: Trachea normal.  Cardiovascular:     Rate and Rhythm: Normal rate and regular rhythm.     Heart sounds: Normal heart sounds, S1 normal and S2 normal. No murmur heard.   No gallop.  Pulmonary:     Effort: Pulmonary effort is normal. No respiratory distress.     Breath sounds: Normal breath sounds. No wheezing, rhonchi or rales.  Abdominal:     General: Bowel sounds are normal. There is no distension or abdominal bruit.     Palpations: Abdomen is soft. There is no fluid wave or mass.     Tenderness: There is no abdominal tenderness. There is no guarding or rebound.     Hernia: No hernia is present.  Musculoskeletal:     Cervical back: Normal range of motion and neck supple.  Lymphadenopathy:     Cervical: No cervical adenopathy.  Skin:    General: Skin is warm and dry.     Findings: No rash.  Neurological:     Mental Status: She is alert.     Cranial Nerves: No cranial nerve deficit.     Sensory: No sensory deficit.  Psychiatric:        Mood and Affect: Mood is not anxious or depressed.        Speech:  Speech normal.        Behavior: Behavior normal. Behavior is cooperative.        Judgment: Judgment normal.      Results for orders placed or performed in visit on 06/16/21  T3, free  Result Value Ref Range   T3, Free 3.0 2.3 - 4.2 pg/mL  T4, free  Result Value Ref Range   Free T4 1.09 0.60 - 1.60 ng/dL  TSH  Result Value Ref Range   TSH 4.70 0.35 - 5.50 uIU/mL  CBC with Differential/Platelet  Result Value Ref Range   WBC 5.4 4.0 - 10.5 K/uL   RBC 4.54 3.87 - 5.11 Mil/uL   Hemoglobin 12.8 12.0 - 15.0 g/dL   HCT 38.4 36.0 - 46.0 %   MCV 84.6 78.0 - 100.0 fl   MCHC 33.3 30.0 - 36.0 g/dL   RDW 14.7 11.5 - 15.5 %   Platelets 194.0 150.0 - 400.0 K/uL   Neutrophils Relative % 54.4 43.0 - 77.0 %   Lymphocytes Relative 33.7 12.0 - 46.0 %   Monocytes Relative 7.2 3.0 - 12.0 %   Eosinophils Relative 4.0 0.0 - 5.0 %   Basophils Relative 0.7 0.0 - 3.0 %   Neutro Abs 2.9 1.4 - 7.7 K/uL   Lymphs Abs 1.8 0.7 - 4.0 K/uL   Monocytes Absolute 0.4 0.1 - 1.0 K/uL   Eosinophils Absolute 0.2 0.0 - 0.7 K/uL   Basophils Absolute 0.0 0.0 - 0.1 K/uL  Comprehensive metabolic panel  Result Value Ref Range   Sodium 139 135 - 145 mEq/L   Potassium 3.6 3.5 - 5.1 mEq/L   Chloride 104 96 -  112 mEq/L   CO2 28 19 - 32 mEq/L   Glucose, Bld 112 (H) 70 - 99 mg/dL   BUN 20 6 - 23 mg/dL   Creatinine, Ser 0.76 0.40 - 1.20 mg/dL   Total Bilirubin 0.7 0.2 - 1.2 mg/dL   Alkaline Phosphatase 52 39 - 117 U/L   AST 10 0 - 37 U/L   ALT 9 0 - 35 U/L   Total Protein 6.3 6.0 - 8.3 g/dL   Albumin 4.0 3.5 - 5.2 g/dL   GFR 73.48 >60.00 mL/min   Calcium 9.0 8.4 - 10.5 mg/dL  Lipid panel  Result Value Ref Range   Cholesterol 192 0 - 200 mg/dL   Triglycerides 167.0 (H) 0.0 - 149.0 mg/dL   HDL 64.60 >39.00 mg/dL   VLDL 33.4 0.0 - 40.0 mg/dL   LDL Cholesterol 94 0 - 99 mg/dL   Total CHOL/HDL Ratio 3    NonHDL 126.90   Hemoglobin A1c  Result Value Ref Range   Hgb A1c MFr Bld 5.9 4.6 - 6.5 %    This visit  occurred during the SARS-CoV-2 public health emergency.  Safety protocols were in place, including screening questions prior to the visit, additional usage of staff PPE, and extensive cleaning of exam room while observing appropriate contact time as indicated for disinfecting solutions.   COVID 19 screen:  No recent travel or known exposure to COVID19 The patient denies respiratory symptoms of COVID 19 at this time. The importance of social distancing was discussed today.   Assessment and Plan   The patient's preventative maintenance and recommended screening tests for an annual wellness exam were reviewed in full today. Brought up to date unless services declined.  Counselled on the importance of diet, exercise, and its role in overall health and mortality. The patient's FH and SH was reviewed, including their home life, tobacco status, and drug and alcohol status.   Vaccines: uptodate  PNA, zoster. SE to flu in past refused Pap/DVE: Pap/DVE not indicated.   asymptomatic Mammo:06/27/2020, plans continuing yearly 06/28/2021 Bone Density:06/16/21 wrist nml.. Plan repeat in 5 years Colon: 04/2018 Dr. Silverio Decamp high grade tubular adenoma.. repeat in 3 years.. due 04/2021 Smoking Status: none   Problem List Items Addressed This Visit     Essential hypertension, benign (Chronic)    Stable, chronic.  Continue current medication.    Improved control on  HCTZ 25 mg daily and valsartan160 mg daily      Relevant Medications   valsartan (DIOVAN) 160 MG tablet   hydrochlorothiazide (HYDRODIURIL) 25 MG tablet   Hereditary and idiopathic peripheral neuropathy (Chronic)    Stable, chronic.  Continue current medication.   Gabapentin 600 mg TID      Relevant Medications   gabapentin (NEURONTIN) 600 MG tablet   Hypothyroidism (Chronic)    Stable, chronic.  Continue current medication.   Stable control on levo 75 mcg daily.      Iron deficiency anemia (Chronic)    Resolved on recent labs,       Pure hypercholesterolemia (Chronic)    Stable, chronic.  Continue current medication.   simvastatin 40 mg daily      Relevant Medications   valsartan (DIOVAN) 160 MG tablet   hydrochlorothiazide (HYDRODIURIL) 25 MG tablet   Urge incontinence (Chronic)    Trial of increase dose of vesicare at 10 mg daily.      Class 2 severe obesity due to excess calories with serious comorbidity and body mass index (BMI) of 36.0  to 36.9 in adult [facility])    Encouraged exercise, weight loss, healthy eating habits.       Other Visit Diagnoses     Routine general medical examination at a health care facility    -  Primary         Eliezer Lofts, MD

## 2021-06-23 NOTE — Patient Instructions (Addendum)
Try increased dose of Vesicare at 2 tabs of 5 mg daily.. call if improvement in urgency. Keep up great work on increasing exercise, healthy eating habits and weight loss.

## 2021-06-23 NOTE — Assessment & Plan Note (Signed)
Trial of increase dose of vesicare at 10 mg daily.

## 2021-06-23 NOTE — Assessment & Plan Note (Signed)
Stable, chronic.  Continue current medication.   Stable control on levo 75 mcg daily. 

## 2021-06-23 NOTE — Assessment & Plan Note (Signed)
Resolved on recent labs,

## 2021-06-23 NOTE — Assessment & Plan Note (Signed)
Stable, chronic.  Continue current medication.    Improved control on  HCTZ 25 mg daily and valsartan160 mg daily

## 2021-06-23 NOTE — Assessment & Plan Note (Signed)
Encouraged exercise, weight loss, healthy eating habits. ? ?

## 2021-06-23 NOTE — Assessment & Plan Note (Signed)
Stable, chronic.  Continue current medication.   simvastatin 40 mg daily

## 2021-06-23 NOTE — Assessment & Plan Note (Signed)
Stable, chronic.  Continue current medication.   Gabapentin 600 mg TID

## 2021-06-28 ENCOUNTER — Ambulatory Visit
Admission: RE | Admit: 2021-06-28 | Discharge: 2021-06-28 | Disposition: A | Discharge status: Home / Self Care | Payer: Federal, State, Local not specified - PPO | Source: Ambulatory Visit | Attending: Family Medicine | Admitting: Family Medicine

## 2021-06-28 DIAGNOSIS — Z1231 Encounter for screening mammogram for malignant neoplasm of breast: Secondary | ICD-10-CM

## 2021-07-16 ENCOUNTER — Other Ambulatory Visit: Payer: Self-pay | Admitting: Family Medicine

## 2021-07-25 ENCOUNTER — Encounter: Payer: Self-pay | Admitting: Gastroenterology

## 2021-08-07 ENCOUNTER — Telehealth: Payer: Self-pay | Admitting: Family Medicine

## 2021-08-07 NOTE — Telephone Encounter (Signed)
Brooke with [facility] called wanting pt latest lab result. Jerene Pitch states you can fax it to [phone]. Please advise. ?

## 2021-08-07 NOTE — Telephone Encounter (Signed)
Labs faxed as requested

## 2021-08-18 ENCOUNTER — Encounter: Payer: Self-pay | Admitting: Physician Assistant

## 2021-08-18 ENCOUNTER — Ambulatory Visit: Payer: Federal, State, Local not specified - PPO | Admitting: Physician Assistant

## 2021-08-18 VITALS — BP 128/64 | HR 77 | Ht 66.0 in | Wt 223.0 lb

## 2021-08-18 DIAGNOSIS — Z86010 Personal history of colonic polyps: Secondary | ICD-10-CM

## 2021-08-18 MED ORDER — PLENVU 140 G PO SOLR: 1.0000 | Freq: Once | ORAL | 0 refills | Status: AC

## 2021-08-18 NOTE — Progress Notes (Signed)
? ?Chief Complaint: History of adenomatous polyp ? ?HPI: ?   Kristine [name] is an 81 year old female with a past medical history as listed below, known to Dr. Silverio Decamp, who presents to clinic today to discuss a surveillance colonoscopy given her history of adenomatous polyps. ?   05/05/2018 colonoscopy with three 1-2 mm polyps in the transverse colon, ascending colon and in the cecum and an 8 mm polyp in the rectum, pathology showed tubular adenomas.  Also nonbleeding hemorrhoids.  Patient was told to repeat in 3 years, but was then sent a letter that she would need an office visit prior to colonoscopy due to age. ?   Today, the patient tells me that she is in good health.  She is not sure why we would stop screening her for colon cancer at the age of 64 because she feels good.  She would like to have another colonoscopy.  Denies any GI symptoms or issues today.  Tells me she has had 2 surgeries, 1 recently about 6 to 7 months ago on her hip and the other on her back.  She did well for these.  Tells me she did well for her last colonoscopy but would like a decreased volume of prep.  Tells me her husband had a colonoscopy with Dr. Henrene Pastor just about 10 days ago and she would like to have what ever prep he was given. ?   Denies fever, chills, weight loss, change in bowel habits or abdominal pain. ? ?Past Medical History:  ?Diagnosis Date  ? Allergy   ? Anal fissure   ? Anemia   ? pt denies   ? Arthritis   ? hands, back, hips  ? Back pain   ? L4 nerve root compression  ? Benign tumor   ? left ear  ? Cataract   ? GERD (gastroesophageal reflux disease)   ? High cholesterol   ? Hypertension   ? Hypothyroidism   ? Neuropathy   ? 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 Sinusitis   ? chronic  ? Thyroid disease   ? Vertigo   ? HX of, last episode over 18 months ago  ? ? ?Past Surgical History:  ?Procedure Laterality Date  ? ABDOMINAL HYSTERECTOMY  1996  ? ANTERIOR LAT LUMBAR FUSION Right 01/11/2021  ? Procedure: RIGHT LATERAL  INTERBODY FUSION LUMBAR THREE -LUMBAR FOUR, LUMBAR FOUR- LUMBAR FIVE WITH INSTRUMENTATION AND ALLOGRAFT;  Surgeon: Phylliss Bob, MD;  Location: [place];  Service: Orthopedics;  Laterality: Right;  ? BACK SURGERY  2019  ? L4-5   ? BREAST BIOPSY    ? BREAST EXCISIONAL BIOPSY Left 1980  ? benign  ? CATARACT EXTRACTION W/PHACO Right 09/08/2019  ? Procedure: CATARACT EXTRACTION PHACO AND INTRAOCULAR LENS PLACEMENT ([facility]) RIGHT VIVITY LENS 8.89  01:14.2;  Surgeon: Birder Robson, MD;  Location: [place];  Service: Ophthalmology;  Laterality: Right;  prefers later morning  ? CATARACT EXTRACTION W/PHACO Left 09/29/2019  ? Procedure: CATARACT EXTRACTION PHACO AND INTRAOCULAR LENS PLACEMENT ([place]) LEFT VIVITY 6.45  00:41.0;  Surgeon: Birder Robson, MD;  Location: [place];  Service: Ophthalmology;  Laterality: Left;  ? COLONOSCOPY    ? DILATION AND CURETTAGE OF UTERUS    ? EYE SURGERY    ? HEMORROIDECTOMY  2006, 2009  ? HIP SURGERY  2011  ? L hip replacement  ? JOINT REPLACEMENT Left 2011  ? Total hip  ? LIPOMA EXCISION    ? LUMBAR LAMINECTOMY/DECOMPRESSION MICRODISCECTOMY N/A  08/28/2017  ? Procedure: Decompression L3-5, Excision of synovial cyst L4-5;  Surgeon: Melina Schools, MD;  Location: [place];  Service: Orthopedics;  Laterality: N/A;  3.5 hrs  ? RECTAL POLYPECTOMY  2010  ? RECTOCELE REPAIR  2009  ? TOOTH EXTRACTION    ? #12  ? TOTAL ABDOMINAL HYSTERECTOMY W/ BILATERAL SALPINGOOPHORECTOMY    ? TOTAL HIP ARTHROPLASTY Right 07/13/2020  ? Procedure: TOTAL HIP ARTHROPLASTY ANTERIOR APPROACH;  Surgeon: Gaynelle Arabian, MD;  Location: [facility];  Service: Orthopedics;  Laterality: Right;  138mn  ? TUBAL LIGATION    ? ? ?Current Outpatient Medications  ?Medication Sig Dispense Refill  ? amoxicillin (AMOXIL) 500 MG capsule Take 500 mg by mouth See admin instructions. Take 2000 mg by mouth 1 hour prior to dental procedures    ? colchicine 0.6 MG tablet Take 1 tablet (0.6 mg total) by mouth 2 (two) times  daily as needed (for gout flare). 90 tablet 1  ? cyanocobalamin 2000 MCG tablet Take 2,000 mcg by mouth daily.    ? estrogens, conjugated, (PREMARIN) 0.625 MG tablet Take 1 tablet (0.625 mg total) by mouth every evening. 90 tablet 1  ? fexofenadine (ALLEGRA) 180 MG tablet Take 180 mg by mouth as needed for allergies or rhinitis.    ? fluticasone (FLONASE) 50 MCG/ACT nasal spray Place 2 sprays into both nostrils daily as needed for allergies.    ? folic acid (FOLVITE) 1 MG tablet Take 1 mg by mouth daily.    ? gabapentin (NEURONTIN) 600 MG tablet Take 1 tablet (600 mg total) by mouth 3 (three) times daily. 270 tablet 1  ? hydrochlorothiazide (HYDRODIURIL) 25 MG tablet Take 1 tablet (25 mg total) by mouth daily. 90 tablet 3  ? HYDROcodone-acetaminophen (NORCO/VICODIN) 5-325 MG tablet Take 1 tablet by mouth every 4 (four) hours as needed for moderate pain ((score 4 to 6)). 30 tablet 0  ? levothyroxine (SYNTHROID) 75 MCG tablet TAKE 1 TABLET DAILY BEFORE BREAKFAST 90 tablet 3  ? methocarbamol (ROBAXIN) 500 MG tablet Take 1 tablet (500 mg total) by mouth every 6 (six) hours as needed for muscle spasms. 60 tablet 0  ? methotrexate (RHEUMATREX) 2.5 MG tablet Take 10 mg by mouth every Monday.    ? montelukast (SINGULAIR) 10 MG tablet TAKE 1 TABLET AT BEDTIME (Patient taking differently: Take 10 mg by mouth as needed.) 90 tablet 0  ? Neomy-Bacit-Polymyx-Pramoxine (TRIPLE ANTIBIOTIC PAIN RELIEF) 1 % OINT Apply 1 application topically as needed (wound care).    ? Omega-3 Fatty Acids (FISH OIL) 1200 MG CAPS Take 2,400 mg by mouth 2 (two) times daily.    ? omeprazole (PRILOSEC) 20 MG capsule Take 20 mg by mouth daily.    ? PEG-KCl-NaCl-NaSulf-Na Asc-C (PLENVU) 140 g SOLR Take 1 kit by mouth once for 1 dose. 3 each 0  ? polyethylene glycol (MIRALAX / GLYCOLAX) packet Take 17 g by mouth daily.    ? simvastatin (ZOCOR) 40 MG tablet TAKE 1 TABLET (40MG) BY MOUTH ONCE IN THE EVENING 90 tablet 1  ? solifenacin (VESICARE) 5 MG tablet  TAKE 1 TABLET DAILY 90 tablet 3  ? trolamine salicylate (ASPERCREME) 10 % cream Apply 1 application topically at bedtime. Applied to feet at night    ? valsartan (DIOVAN) 160 MG tablet Take 1 tablet (160 mg total) by mouth daily. 90 tablet 3  ? ?No current facility-administered medications for this visit.  ? ? ?Allergies as of 08/18/2021 - Review Complete 08/18/2021  ?Allergen Reaction Noted  ?  Corn-containing products Diarrhea 08/26/2017  ? Influenza vaccines  11/12/2013  ? Meperidine hcl Other (See Comments)   ? ? ?Family History  ?Problem Relation Age of Onset  ? Hyperlipidemia Mother   ? Heart disease Mother   ?     heart attack  ? Kidney disease Father   ? Cancer Sister   ?     multiple myeloma  ? Diabetes Brother   ? Colon cancer Neg Hx   ? Esophageal cancer Neg Hx   ? Rectal cancer Neg Hx   ? Stomach cancer Neg Hx   ? Breast cancer Neg Hx   ? ? ?Social History  ? ?Socioeconomic History  ? Marital status: Married  ?  Spouse name: Not on file  ? Number of children: Not on file  ? Years of education: Not on file  ? Highest education level: Not on file  ?Occupational History  ? Not on file  ?Tobacco Use  ? Smoking status: Never  ? Smokeless tobacco: Never  ?Vaping Use  ? Vaping Use: Never used  ?Substance and Sexual Activity  ? Alcohol use: No  ? Drug use: No  ? Sexual activity: Not on file  ?Other Topics Concern  ? Not on file  ?Social History Narrative  ? Not on file  ? ?Social Determinants of Health  ? ?Financial Resource Strain: Not on file  ?Food Insecurity: Not on file  ?Transportation Needs: Not on file  ?Physical Activity: Not on file  ?Stress: Not on file  ?Social Connections: Not on file  ?Intimate Partner Violence: Not on file  ? ? ?Review of Systems:    ?Constitutional: No weight loss, fever or chills ?Skin: No rash  ?Cardiovascular: No chest pain ?Respiratory: No SOB  ?Gastrointestinal: See HPI and otherwise negative ?Genitourinary: No dysuria ?Neurological: No headache, dizziness or  syncope ?Musculoskeletal: No new muscle or joint pain ?Hematologic: No bleeding ?Psychiatric: No history of depression or anxiety ? ? Physical Exam:  ?Vital signs: ?BP 128/64   Pulse 77   Ht _0  (1.676 m)   Wt 223 lb (101

## 2021-08-18 NOTE — Patient Instructions (Signed)
You have been scheduled for an endoscopy and colonoscopy. Please follow the written instructions given to you at your visit today. ?Please pick up your prep supplies at the pharmacy within the next 1-3 days. ?If you use inhalers (even only as needed), please bring them with you on the day of your procedure. ? ?If you are age 82 or older, your body mass index should be between 23-30. Your Body mass index is 35.99 kg/m?Marland Kitchen If this is out of the aforementioned range listed, please consider follow up with your Primary Care Provider. ? ?If you are age 47 or younger, your body mass index should be between 19-25. Your Body mass index is 35.99 kg/m?Marland Kitchen If this is out of the aformentioned range listed, please consider follow up with your Primary Care Provider.  ? ?________________________________________________________ ? ?The [facility] providers would like to encourage you to use [facility] to communicate with providers for non-urgent requests or questions.  Due to long hold times on the telephone, sending your provider a message by [facility] may be a faster and more efficient way to get a response.  Please allow 48 business hours for a response.  Please remember that this is for non-urgent requests.  ?_______________________________________________________ ? ?

## 2021-08-22 ENCOUNTER — Telehealth: Payer: Self-pay | Admitting: Physician Assistant

## 2021-08-22 MED ORDER — PLENVU 140 G PO SOLR: 1.0000 | Freq: Once | ORAL | 0 refills | Status: DC

## 2021-08-22 NOTE — Telephone Encounter (Signed)
Sent Plenvu to [facility] by accident. Resent Plenvu to [facility] the correct pharmacy. ?

## 2021-08-22 NOTE — Telephone Encounter (Signed)
Patient called back requesting to have the Plenvu sent to [facility] instead in [place] and cancel with [facility]. ?

## 2021-08-23 MED ORDER — PLENVU 140 G PO SOLR: 1.0000 | Freq: Once | ORAL | 0 refills | Status: AC

## 2021-08-23 NOTE — Addendum Note (Signed)
Addended by: Horris Latino on: 08/23/2021 08:50 AM ? ? Modules accepted: Orders ? ?

## 2021-08-23 NOTE — Telephone Encounter (Signed)
Resent script to patient's local pharmacy. ?

## 2021-08-24 NOTE — Progress Notes (Signed)
Reviewed and agree with documentation and assessment and plan. K. Veena Maisley Hainsworth , MD   

## 2021-09-22 ENCOUNTER — Encounter: Payer: Self-pay | Admitting: Family Medicine

## 2021-09-22 MED ORDER — SOLIFENACIN SUCCINATE 10 MG PO TABS: 10.0000 mg | ORAL_TABLET | Freq: Every day | ORAL | 3 refills | Status: DC

## 2021-09-26 ENCOUNTER — Encounter: Payer: Self-pay | Admitting: Gastroenterology

## 2021-09-26 ENCOUNTER — Ambulatory Visit (AMBULATORY_SURGERY_CENTER): Payer: Federal, State, Local not specified - PPO | Admitting: Gastroenterology

## 2021-09-26 VITALS — BP 158/61 | HR 54 | Temp 96.6°F | Resp 20 | Ht 66.0 in | Wt 223.0 lb

## 2021-09-26 DIAGNOSIS — D122 Benign neoplasm of ascending colon: Secondary | ICD-10-CM

## 2021-09-26 DIAGNOSIS — Z86010 Personal history of colonic polyps: Secondary | ICD-10-CM | POA: Diagnosis not present

## 2021-09-26 MED ORDER — SODIUM CHLORIDE 0.9 % IV SOLN: 500.0000 mL | Freq: Once | INTRAVENOUS | Status: DC

## 2021-09-26 NOTE — Progress Notes (Signed)
[facility] History and Physical ? ? ?Primary Care Physician:  Jinny Sanders, MD ? ? ?Reason for Procedure:  History of adenomatous colon polyps ? ?Plan:    Surveillance colonoscopy with possible interventions as needed ? ? ? ? ?HPI: Kristine [name] is a very pleasant 81 y.o. female here for surveillance colonoscopy. ?Please refer to office visit note 08/18/21 by Ellouise Newer for details ? ?Denies any nausea, vomiting, abdominal pain, melena or bright red blood per rectum ? ?The risks and benefits as well as alternatives of endoscopic procedure(s) have been discussed and reviewed. All questions answered. The patient agrees to proceed. ? ? ? ?Past Medical History:  ?Diagnosis Date  ? Allergy   ? Anal fissure   ? Anemia   ? pt denies   ? Arthritis   ? hands, back, hips  ? Back pain   ? L4 nerve root compression  ? Benign tumor   ? left ear  ? Cataract   ? GERD (gastroesophageal reflux disease)   ? High cholesterol   ? Hypertension   ? Hypothyroidism   ? Neuropathy   ? 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 Sinusitis   ? chronic  ? Thyroid disease   ? Vertigo   ? HX of, last episode over 18 months ago  ? ? ?Past Surgical History:  ?Procedure Laterality Date  ? ABDOMINAL HYSTERECTOMY  1996  ? ANTERIOR LAT LUMBAR FUSION Right 01/11/2021  ? Procedure: RIGHT LATERAL INTERBODY FUSION LUMBAR THREE -LUMBAR FOUR, LUMBAR FOUR- LUMBAR FIVE WITH INSTRUMENTATION AND ALLOGRAFT;  Surgeon: Phylliss Bob, MD;  Location: [facility];  Service: Orthopedics;  Laterality: Right;  ? BACK SURGERY  2019  ? L4-5   ? BREAST BIOPSY    ? BREAST EXCISIONAL BIOPSY Left 1980  ? benign  ? CATARACT EXTRACTION W/PHACO Right 09/08/2019  ? Procedure: CATARACT EXTRACTION PHACO AND INTRAOCULAR LENS PLACEMENT ([facility]) RIGHT VIVITY LENS 8.89  01:14.2;  Surgeon: Birder Robson, MD;  Location: [facility];  Service: Ophthalmology;  Laterality: Right;  prefers later morning  ? CATARACT EXTRACTION W/PHACO Left 09/29/2019  ? Procedure:  CATARACT EXTRACTION PHACO AND INTRAOCULAR LENS PLACEMENT ([place]) LEFT VIVITY 6.45  00:41.0;  Surgeon: Birder Robson, MD;  Location: [facility];  Service: Ophthalmology;  Laterality: Left;  ? COLONOSCOPY    ? DILATION AND CURETTAGE OF UTERUS    ? EYE SURGERY    ? HEMORROIDECTOMY  2006, 2009  ? HIP SURGERY  2011  ? L hip replacement  ? JOINT REPLACEMENT Left 2011  ? Total hip  ? LIPOMA EXCISION    ? LUMBAR LAMINECTOMY/DECOMPRESSION MICRODISCECTOMY N/A 08/28/2017  ? Procedure: Decompression L3-5, Excision of synovial cyst L4-5;  Surgeon: Melina Schools, MD;  Location: [facility];  Service: Orthopedics;  Laterality: N/A;  3.5 hrs  ? RECTAL POLYPECTOMY  2010  ? RECTOCELE REPAIR  2009  ? TOOTH EXTRACTION    ? #12  ? TOTAL ABDOMINAL HYSTERECTOMY W/ BILATERAL SALPINGOOPHORECTOMY    ? TOTAL HIP ARTHROPLASTY Right 07/13/2020  ? Procedure: TOTAL HIP ARTHROPLASTY ANTERIOR APPROACH;  Surgeon: Gaynelle Arabian, MD;  Location: [facility];  Service: Orthopedics;  Laterality: Right;  163mn  ? TUBAL LIGATION    ? ? ?Prior to Admission medications   ?Medication Sig Start Date End Date Taking? Authorizing Provider  ?amoxicillin (AMOXIL) 500 MG capsule Take 500 mg by mouth See admin instructions. Take 2000 mg by mouth 1 hour prior to dental procedures    [provider]  ?colchicine 0.6 MG tablet Take 1 tablet (0.6 mg total) by mouth 2 (two) times daily as needed (for gout flare). 06/23/20   Jinny Sanders, MD  ?cyanocobalamin 2000 MCG tablet Take 2,000 mcg by mouth daily.    [provider]  ?estrogens, conjugated, (PREMARIN) 0.625 MG tablet Take 1 tablet (0.625 mg total) by mouth every evening. 06/23/21   Bedsole, Amy E, MD  ?fexofenadine (ALLEGRA) 180 MG tablet Take 180 mg by mouth as needed for allergies or rhinitis.    [provider]  ?fluticasone (FLONASE) 50 MCG/ACT nasal spray Place 2 sprays into both nostrils daily as needed for allergies.    [provider]  ?folic acid (FOLVITE) 1 MG tablet  Take 1 mg by mouth daily. 02/24/20   [provider]  ?gabapentin (NEURONTIN) 600 MG tablet Take 1 tablet (600 mg total) by mouth 3 (three) times daily. 06/23/21   Bedsole, Amy E, MD  ?hydrochlorothiazide (HYDRODIURIL) 25 MG tablet Take 1 tablet (25 mg total) by mouth daily. 06/23/21   Jinny Sanders, MD  ?HYDROcodone-acetaminophen (NORCO/VICODIN) 5-325 MG tablet Take 1 tablet by mouth every 4 (four) hours as needed for moderate pain ((score 4 to 6)). 01/13/21   McKenzie, Lennie Muckle, PA-C  ?levothyroxine (SYNTHROID) 75 MCG tablet TAKE 1 TABLET DAILY BEFORE BREAKFAST 03/15/21   Bedsole, Amy E, MD  ?methocarbamol (ROBAXIN) 500 MG tablet Take 1 tablet (500 mg total) by mouth every 6 (six) hours as needed for muscle spasms. 01/13/21   McKenzie, Lennie Muckle, PA-C  ?methotrexate (RHEUMATREX) 2.5 MG tablet Take 10 mg by mouth every Monday. 05/20/20   [provider]  ?montelukast (SINGULAIR) 10 MG tablet TAKE 1 TABLET AT BEDTIME ?Patient taking differently: Take 10 mg by mouth as needed. 05/05/20   Bedsole, Amy E, MD  ?Neomy-Bacit-Polymyx-Pramoxine (TRIPLE ANTIBIOTIC PAIN RELIEF) 1 % OINT Apply 1 application topically as needed (wound care).    [provider]  ?Omega-3 Fatty Acids (FISH OIL) 1200 MG CAPS Take 2,400 mg by mouth 2 (two) times daily.    [provider]  ?omeprazole (PRILOSEC) 20 MG capsule Take 20 mg by mouth daily.    [provider]  ?polyethylene glycol (MIRALAX / GLYCOLAX) packet Take 17 g by mouth daily.    [provider]  ?simvastatin (ZOCOR) 40 MG tablet TAKE 1 TABLET ('40MG'$ ) BY MOUTH ONCE IN THE EVENING 06/01/21   Bedsole, Amy E, MD  ?solifenacin (VESICARE) 10 MG tablet Take 1 tablet (10 mg total) by mouth daily. 09/22/21   Bedsole, Amy E, MD  ?trolamine salicylate (ASPERCREME) 10 % cream Apply 1 application topically at bedtime. Applied to feet at night    [provider]  ?valsartan (DIOVAN) 160 MG tablet Take 1 tablet (160 mg total) by mouth daily.  06/23/21   Jinny Sanders, MD  ? ? ?Current Outpatient Medications  ?Medication Sig Dispense Refill  ? amoxicillin (AMOXIL) 500 MG capsule Take 500 mg by mouth See admin instructions. Take 2000 mg by mouth 1 hour prior to dental procedures    ? colchicine 0.6 MG tablet Take 1 tablet (0.6 mg total) by mouth 2 (two) times daily as needed (for gout flare). 90 tablet 1  ? cyanocobalamin 2000 MCG tablet Take 2,000 mcg by mouth daily.    ? estrogens, conjugated, (PREMARIN) 0.625 MG tablet Take 1 tablet (0.625 mg total) by mouth every evening. 90 tablet 1  ? fexofenadine (ALLEGRA) 180 MG tablet Take 180 mg by  mouth as needed for allergies or rhinitis.    ? fluticasone (FLONASE) 50 MCG/ACT nasal spray Place 2 sprays into both nostrils daily as needed for allergies.    ? folic acid (FOLVITE) 1 MG tablet Take 1 mg by mouth daily.    ? gabapentin (NEURONTIN) 600 MG tablet Take 1 tablet (600 mg total) by mouth 3 (three) times daily. 270 tablet 1  ? hydrochlorothiazide (HYDRODIURIL) 25 MG tablet Take 1 tablet (25 mg total) by mouth daily. 90 tablet 3  ? HYDROcodone-acetaminophen (NORCO/VICODIN) 5-325 MG tablet Take 1 tablet by mouth every 4 (four) hours as needed for moderate pain ((score 4 to 6)). 30 tablet 0  ? levothyroxine (SYNTHROID) 75 MCG tablet TAKE 1 TABLET DAILY BEFORE BREAKFAST 90 tablet 3  ? methocarbamol (ROBAXIN) 500 MG tablet Take 1 tablet (500 mg total) by mouth every 6 (six) hours as needed for muscle spasms. 60 tablet 0  ? methotrexate (RHEUMATREX) 2.5 MG tablet Take 10 mg by mouth every Monday.    ? montelukast (SINGULAIR) 10 MG tablet TAKE 1 TABLET AT BEDTIME (Patient taking differently: Take 10 mg by mouth as needed.) 90 tablet 0  ? Neomy-Bacit-Polymyx-Pramoxine (TRIPLE ANTIBIOTIC PAIN RELIEF) 1 % OINT Apply 1 application topically as needed (wound care).    ? Omega-3 Fatty Acids (FISH OIL) 1200 MG CAPS Take 2,400 mg by mouth 2 (two) times daily.    ? omeprazole (PRILOSEC) 20 MG capsule Take 20 mg by mouth  daily.    ? polyethylene glycol (MIRALAX / GLYCOLAX) packet Take 17 g by mouth daily.    ? simvastatin (ZOCOR) 40 MG tablet TAKE 1 TABLET ('40MG'$ ) BY MOUTH ONCE IN THE EVENING 90 tablet 1  ? solifenacin (VESICARE

## 2021-09-26 NOTE — Patient Instructions (Signed)
Handout on polyps given. ° °YOU HAD AN ENDOSCOPIC PROCEDURE TODAY AT THE [facility]:   Refer to the procedure report that was given to you for any specific questions about what was found during the examination.  If the procedure report does not answer your questions, please call your gastroenterologist to clarify.  If you requested that your care partner not be given the details of your procedure findings, then the procedure report has been included in a sealed envelope for you to review at your convenience later. ° °YOU SHOULD EXPECT: Some feelings of bloating in the abdomen. Passage of more gas than usual.  Walking can help get rid of the air that was put into your GI tract during the procedure and reduce the bloating. If you had a lower endoscopy (such as a colonoscopy or flexible sigmoidoscopy) you may notice spotting of blood in your stool or on the toilet paper. If you underwent a bowel prep for your procedure, you may not have a normal bowel movement for a few days. ° °Please Note:  You might notice some irritation and congestion in your nose or some drainage.  This is from the oxygen used during your procedure.  There is no need for concern and it should clear up in a day or so. ° °SYMPTOMS TO REPORT IMMEDIATELY: ° °Following lower endoscopy (colonoscopy or flexible sigmoidoscopy): ° Excessive amounts of blood in the stool ° Significant tenderness or worsening of abdominal pains ° Swelling of the abdomen that is new, acute ° Fever of 100°F or higher ° °For urgent or emergent issues, a gastroenterologist can be reached at any hour by calling [phone]. °Do not use MyChart messaging for urgent concerns.  ° ° °DIET:  We do recommend a small meal at first, but then you may proceed to your regular diet.  Drink plenty of fluids but you should avoid alcoholic beverages for 24 hours. ° °ACTIVITY:  You should plan to take it easy for the rest of today and you should NOT DRIVE or use heavy machinery  until tomorrow (because of the sedation medicines used during the test).   ° °FOLLOW UP: °Our staff will call the number listed on your records 48-72 hours following your procedure to check on you and address any questions or concerns that you may have regarding the information given to you following your procedure. If we do not reach you, we will leave a message.  We will attempt to reach you two times.  During this call, we will ask if you have developed any symptoms of COVID 19. If you develop any symptoms (ie: fever, flu-like symptoms, shortness of breath, cough etc.) before then, please call [phone].  If you test positive for Covid 19 in the 2 weeks post procedure, please call and report this information to us.   ° °If any biopsies were taken you will be contacted by phone or by letter within the next 1-3 weeks.  Please call us at [phone] if you have not heard about the biopsies in 3 weeks.  ° ° °SIGNATURES/CONFIDENTIALITY: °You and/or your care partner have signed paperwork which will be entered into your electronic medical record.  These signatures attest to the fact that that the information above on your After Visit Summary has been reviewed and is understood.  Full responsibility of the confidentiality of this discharge information lies with you and/or your care-partner.  °

## 2021-09-26 NOTE — Progress Notes (Signed)
Called to room to assist during endoscopic procedure.  Patient ID and intended procedure confirmed with present staff. Received instructions for my participation in the procedure from the performing physician.  

## 2021-09-26 NOTE — Op Note (Signed)
[facility] ?Patient Name: Kristine [name] ?Procedure Date: 09/26/2021 3:45 PM ?MRN: [id number] ?Endoscopist: Mauri Pole , MD ?Age: 81 ?Referring MD:  ?Date of Birth: 08/28/1940 ?Gender: Female ?Account #: [id number] ?Procedure:                Colonoscopy ?Indications:              High risk colon cancer surveillance: Personal  ?                          history of adenoma (10 mm or greater in size), High  ?                          risk colon cancer surveillance: Personal history of  ?                          multiple (3 or more) adenomas ?Medicines:                Monitored Anesthesia Care ?Procedure:                Pre-Anesthesia Assessment: ?                          - Prior to the procedure, a History and Physical  ?                          was performed, and patient medications and  ?                          allergies were reviewed. The patient's tolerance of  ?                          previous anesthesia was also reviewed. The risks  ?                          and benefits of the procedure and the sedation  ?                          options and risks were discussed with the patient.  ?                          All questions were answered, and informed consent  ?                          was obtained. Prior Anticoagulants: The patient has  ?                          taken no previous anticoagulant or antiplatelet  ?                          agents. ASA Grade Assessment: III - A patient with  ?                          severe systemic disease. After reviewing the risks  ?  and benefits, the patient was deemed in  ?                          satisfactory condition to undergo the procedure. ?                          After obtaining informed consent, the colonoscope  ?                          was passed under direct vision. Throughout the  ?                          procedure, the patient's blood pressure, pulse, and  ?                          oxygen saturations were  monitored continuously. The  ?                          0441 PCF-H190TL Slim SB Colonoscope was introduced  ?                          through the anus and advanced to the the cecum,  ?                          identified by appendiceal orifice and ileocecal  ?                          valve. The colonoscopy was performed without  ?                          difficulty. The patient tolerated the procedure  ?                          well. The quality of the bowel preparation was  ?                          excellent. The ileocecal valve, appendiceal  ?                          orifice, and rectum were photographed. ?Scope In: 4:00:23 PM ?Scope Out: 4:13:04 PM ?Scope Withdrawal Time: 0 hours 5 minutes 40 seconds  ?Total Procedure Duration: 0 hours 12 minutes 41 seconds  ?Findings:                 The perianal and digital rectal examinations were  ?                          normal. ?                          Two sessile polyps were found in the ascending  ?                          colon. The polyps were 5 to 7 mm in size. These  ?  polyps were removed with a cold snare. Resection  ?                          and retrieval were complete. ?                          Scattered small-mouthed diverticula were found in  ?                          the sigmoid colon, descending colon, transverse  ?                          colon and ascending colon. ?                          Non-bleeding external and internal hemorrhoids were  ?                          found during retroflexion. The hemorrhoids were  ?                          small. ?Complications:            No immediate complications. ?Estimated Blood Loss:     Estimated blood loss was minimal. ?Impression:               - Two 5 to 7 mm polyps in the ascending colon,  ?                          removed with a cold snare. Resected and retrieved. ?                          - Moderate diverticulosis in the sigmoid colon, in  ?                           the descending colon, in the transverse colon and  ?                          in the ascending colon. ?                          - Non-bleeding external and internal hemorrhoids. ?Recommendation:           - Patient has a contact number available for  ?                          emergencies. The signs and symptoms of potential  ?                          delayed complications were discussed with the  ?                          patient. Return to normal activities tomorrow.  ?                          Written discharge instructions were provided  to the  ?                          patient. ?                          - Resume previous diet. ?                          - Continue present medications. ?                          - Await pathology results. ?                          - No repeat colonoscopy due to age. ?                          - Return to GI clinic PRN. ?Mauri Pole, MD ?09/26/2021 4:18:08 PM ?This report has been signed electronically. ?

## 2021-09-26 NOTE — Progress Notes (Signed)
Report to pacu rn; vss. ?

## 2021-09-26 NOTE — Progress Notes (Signed)
VS  DT ? ?Pt's states no medical or surgical changes since previsit or office visit. ? ?

## 2021-09-28 ENCOUNTER — Telehealth: Payer: Self-pay

## 2021-09-28 NOTE — Telephone Encounter (Signed)
Attempted to reach patient for post-procedure f/u call. No answer. Left message for her to please not hesitate to call us if she has any questions/concerns regarding her care. 

## 2021-09-28 NOTE — Telephone Encounter (Signed)
Attempted to reach patient for post-procedure f/u call. No answer. 2nd attempt. Left message. ?

## 2021-10-05 ENCOUNTER — Ambulatory Visit: Payer: Federal, State, Local not specified - PPO | Admitting: Nurse Practitioner

## 2021-10-05 VITALS — BP 140/86 | HR 73 | Temp 96.7°F | Resp 16 | Wt 224.4 lb

## 2021-10-05 DIAGNOSIS — R300 Dysuria: Secondary | ICD-10-CM | POA: Diagnosis not present

## 2021-10-05 DIAGNOSIS — N3090 Cystitis, unspecified without hematuria: Secondary | ICD-10-CM | POA: Insufficient documentation

## 2021-10-05 LAB — POCT URINALYSIS DIPSTICK
Bilirubin, UA: NEGATIVE
Glucose, UA: NEGATIVE
Ketones, UA: NEGATIVE
Nitrite, UA: POSITIVE
Protein, UA: POSITIVE — AB
Spec Grav, UA: 1.015 (ref 1.010–1.025)
Urobilinogen, UA: 0.2 E.U./dL
pH, UA: 5.5 (ref 5.0–8.0)

## 2021-10-05 MED ORDER — CEPHALEXIN 500 MG PO CAPS
500.0000 mg | ORAL_CAPSULE | Freq: Four times a day (QID) | ORAL | 0 refills | Status: AC
Start: 2021-10-05 — End: 2021-10-12

## 2021-10-05 NOTE — Progress Notes (Signed)
? ?Acute Office Visit ? ?Subjective:  ? ?  ?Patient ID: Kristine [name], female    DOB: 06-06-1940, 81 y.o.   MRN: [id number] ? ?Chief Complaint  ?Patient presents with  ? burning with urination  ?  Started about 2 weeks ago-Burning with urination and and some pain in the pelvic area after urinating, pressure. Cloudy urine. Feels a little worse today. ?  ? ? ?HPI ?Patient is in today for urinary complaints ? ?Symptoms started approx 2 weeks ago with dysuria at the end of urination. States that she had cloudy urine and lower abdoiminal pressure. States that today much more pronounced. ? ?Review of Systems  ?Constitutional:  Negative for chills and fever.  ?Gastrointestinal:  Positive for abdominal pain (pressure). Negative for nausea and vomiting.  ?Genitourinary:  Positive for dysuria. Negative for frequency and hematuria.  ?Musculoskeletal:  Negative for back pain (no increase in baseline).  ? ? ?   ?Objective:  ?  ?BP 140/86   Pulse 73   Temp (!) 96.7 ?F (35.9 ?C)   Resp 16   Wt 224 lb 6 oz (101.8 kg)   SpO2 98%   BMI 36.22 kg/m?  ? ? ?Physical Exam ?Vitals and nursing note reviewed.  ?Constitutional:   ?   Appearance: Normal appearance.  ?Cardiovascular:  ?   Rate and Rhythm: Normal rate and regular rhythm.  ?   Heart sounds: Normal heart sounds.  ?Pulmonary:  ?   Effort: Pulmonary effort is normal.  ?   Breath sounds: Normal breath sounds.  ?Abdominal:  ?   General: Bowel sounds are normal.  ?   Tenderness: There is abdominal tenderness. There is no right CVA tenderness or left CVA tenderness.  ?Neurological:  ?   Mental Status: She is alert.  ? ? ?Results for orders placed or performed in visit on 10/05/21  ?POCT urinalysis dipstick  ?Result Value Ref Range  ? Color, UA yellow   ? Clarity, UA cloudy   ? Glucose, UA Negative Negative  ? Bilirubin, UA negative   ? Ketones, UA negative   ? Spec Grav, UA 1.015 1.010 - 1.025  ? Blood, UA small   ? pH, UA 5.5 5.0 - 8.0  ? Protein, UA Positive (A) Negative  ?  Urobilinogen, UA 0.2 0.2 or 1.0 E.U./dL  ? Nitrite, UA positive   ? Leukocytes, UA Large (3+) (A) Negative  ? Appearance    ? Odor    ? ? ? ?   ?Assessment & Plan:  ? ?Problem List Items Addressed This Visit   ? ?  ? Genitourinary  ? Cystitis  ?  Pending urine culture.  Treat with Keflex 500 mg 4 times daily for 7 days.  Follow-up if no improvement ? ?  ?  ? Relevant Medications  ? cephALEXin (KEFLEX) 500 MG capsule  ?  ? Other  ? Dysuria - Primary  ?  20 indicative of urinary tract infection.  Pending urinary culture ? ?  ?  ? Relevant Orders  ? POCT urinalysis dipstick (Completed)  ? Urine Culture  ? ? ?Meds ordered this encounter  ?Medications  ? cephALEXin (KEFLEX) 500 MG capsule  ?  Sig: Take 1 capsule (500 mg total) by mouth 4 (four) times daily for 7 days.  ?  Dispense:  28 capsule  ?  Refill:  0  ?  Order Specific Question:   Supervising Provider  ?  Answer:   Loura Pardon A [[id number]]  ? ? ?  No follow-ups on file. ? ?Romilda Garret, NP ? ? ?

## 2021-10-05 NOTE — Patient Instructions (Signed)
Nice to see you today ?I sent in antibiotic to your pharmacy ?Follow up if no improvement ?

## 2021-10-05 NOTE — Assessment & Plan Note (Signed)
Pending urine culture.  Treat with Keflex 500 mg 4 times daily for 7 days.  Follow-up if no improvement ?

## 2021-10-05 NOTE — Assessment & Plan Note (Signed)
20 indicative of urinary tract infection.  Pending urinary culture ?

## 2021-10-06 ENCOUNTER — Encounter: Payer: Self-pay | Admitting: Gastroenterology

## 2021-10-06 ENCOUNTER — Ambulatory Visit: Payer: Federal, State, Local not specified - PPO | Admitting: Family Medicine

## 2021-10-07 LAB — URINE CULTURE
MICRO NUMBER:: 13383298
SPECIMEN QUALITY:: ADEQUATE

## 2021-11-26 ENCOUNTER — Other Ambulatory Visit: Payer: Self-pay | Admitting: Family Medicine

## 2021-12-06 ENCOUNTER — Telehealth: Payer: Self-pay | Admitting: Family Medicine

## 2021-12-06 DIAGNOSIS — R7303 Prediabetes: Secondary | ICD-10-CM

## 2021-12-06 DIAGNOSIS — D509 Iron deficiency anemia, unspecified: Secondary | ICD-10-CM

## 2021-12-06 DIAGNOSIS — E039 Hypothyroidism, unspecified: Secondary | ICD-10-CM

## 2021-12-06 DIAGNOSIS — E7800 Pure hypercholesterolemia, unspecified: Secondary | ICD-10-CM

## 2021-12-06 NOTE — Telephone Encounter (Signed)
-----   Message from Ellamae Sia sent at 11/29/2021 11:45 AM EDT ----- Regarding: Lab orders for Wednesday, 7.19.23 Patient is scheduled for CPX labs, please order future labs, Thanks , Karna Christmas

## 2021-12-14 ENCOUNTER — Other Ambulatory Visit (INDEPENDENT_AMBULATORY_CARE_PROVIDER_SITE_OTHER): Payer: Federal, State, Local not specified - PPO

## 2021-12-14 DIAGNOSIS — E7800 Pure hypercholesterolemia, unspecified: Secondary | ICD-10-CM

## 2021-12-14 DIAGNOSIS — E039 Hypothyroidism, unspecified: Secondary | ICD-10-CM

## 2021-12-14 DIAGNOSIS — D509 Iron deficiency anemia, unspecified: Secondary | ICD-10-CM

## 2021-12-14 DIAGNOSIS — R7303 Prediabetes: Secondary | ICD-10-CM | POA: Diagnosis not present

## 2021-12-14 LAB — CBC WITH DIFFERENTIAL/PLATELET
Basophils Absolute: 0 10*3/uL (ref 0.0–0.1)
Basophils Relative: 0.7 % (ref 0.0–3.0)
Eosinophils Absolute: 0.2 10*3/uL (ref 0.0–0.7)
Eosinophils Relative: 4 % (ref 0.0–5.0)
HCT: 37.1 % (ref 36.0–46.0)
Hemoglobin: 12.6 g/dL (ref 12.0–15.0)
Lymphocytes Relative: 31.4 % (ref 12.0–46.0)
Lymphs Abs: 1.7 10*3/uL (ref 0.7–4.0)
MCHC: 33.9 g/dL (ref 30.0–36.0)
MCV: 87.7 fl (ref 78.0–100.0)
Monocytes Absolute: 0.3 10*3/uL (ref 0.1–1.0)
Monocytes Relative: 5.9 % (ref 3.0–12.0)
Neutro Abs: 3.2 10*3/uL (ref 1.4–7.7)
Neutrophils Relative %: 58 % (ref 43.0–77.0)
Platelets: 195 10*3/uL (ref 150.0–400.0)
RBC: 4.23 Mil/uL (ref 3.87–5.11)
RDW: 14.8 % (ref 11.5–15.5)
WBC: 5.4 10*3/uL (ref 4.0–10.5)

## 2021-12-14 LAB — COMPREHENSIVE METABOLIC PANEL
ALT: 8 U/L (ref 0–35)
AST: 10 U/L (ref 0–37)
Albumin: 4.1 g/dL (ref 3.5–5.2)
Alkaline Phosphatase: 47 U/L (ref 39–117)
BUN: 20 mg/dL (ref 6–23)
CO2: 27 mEq/L (ref 19–32)
Calcium: 8.9 mg/dL (ref 8.4–10.5)
Chloride: 105 mEq/L (ref 96–112)
Creatinine, Ser: 0.76 mg/dL (ref 0.40–1.20)
GFR: 73.23 mL/min (ref >60.00)
Glucose, Bld: 106 mg/dL — ABNORMAL HIGH (ref 70–99)
Potassium: 4.3 mEq/L (ref 3.5–5.1)
Sodium: 141 mEq/L (ref 135–145)
Total Bilirubin: 0.8 mg/dL (ref 0.2–1.2)
Total Protein: 6 g/dL (ref 6.0–8.3)

## 2021-12-14 LAB — LIPID PANEL
Cholesterol: 173 mg/dL (ref 0–200)
HDL: 55.1 mg/dL (ref >39.00)
LDL Cholesterol: 83 mg/dL (ref 0–99)
NonHDL: 117.68
Total CHOL/HDL Ratio: 3
Triglycerides: 171 mg/dL — ABNORMAL HIGH (ref 0.0–149.0)
VLDL: 34.2 mg/dL (ref 0.0–40.0)

## 2021-12-14 LAB — HEMOGLOBIN A1C: Hgb A1c MFr Bld: 5.9 % (ref 4.6–6.5)

## 2021-12-14 LAB — T4, FREE: Free T4: 1 ng/dL (ref 0.60–1.60)

## 2021-12-14 LAB — T3, FREE: T3, Free: 2.3 pg/mL (ref 2.3–4.2)

## 2021-12-14 LAB — TSH: TSH: 3.58 u[IU]/mL (ref 0.35–5.50)

## 2021-12-18 NOTE — Progress Notes (Signed)
No critical labs need to be addressed urgently. We will discuss labs in detail at upcoming office visit.   

## 2021-12-21 ENCOUNTER — Ambulatory Visit: Payer: Federal, State, Local not specified - PPO | Admitting: Family Medicine

## 2021-12-22 ENCOUNTER — Ambulatory Visit: Payer: Federal, State, Local not specified - PPO | Admitting: Family Medicine

## 2021-12-22 ENCOUNTER — Encounter: Payer: Self-pay | Admitting: Family Medicine

## 2021-12-22 VITALS — BP 150/70 | HR 84 | Temp 97.7°F | Ht 65.5 in | Wt 222.5 lb

## 2021-12-22 DIAGNOSIS — I10 Essential (primary) hypertension: Secondary | ICD-10-CM

## 2021-12-22 DIAGNOSIS — R7303 Prediabetes: Secondary | ICD-10-CM

## 2021-12-22 DIAGNOSIS — E7800 Pure hypercholesterolemia, unspecified: Secondary | ICD-10-CM | POA: Diagnosis not present

## 2021-12-22 DIAGNOSIS — L989 Disorder of the skin and subcutaneous tissue, unspecified: Secondary | ICD-10-CM | POA: Diagnosis not present

## 2021-12-22 MED ORDER — TRIAMCINOLONE ACETONIDE 0.5 % EX CREA: 1.0000 | TOPICAL_CREAM | Freq: Two times a day (BID) | CUTANEOUS | 0 refills | Status: DC

## 2021-12-22 NOTE — Patient Instructions (Addendum)
Check blood pressure over the next 2 weeks .Marland Kitchen If above 140/90 consistently.. call.  Try trial of topical steroid on skin.Marland Kitchen if not improving call Dermatology to set up OV.

## 2021-12-22 NOTE — Progress Notes (Signed)
Patient ID: Kristine [name], female    DOB: Mar 09, 1941, 81 y.o.   MRN: [id number]  This visit was conducted in person.  BP (!) 150/70   Pulse 84   Temp 97.7 F (36.5 C) (Oral)   Ht 5' 5.5" (1.664 m)   Wt 222 lb 8 oz (100.9 kg)   SpO2 97%   BMI 36.46 kg/m    CC:  Chief Complaint  Patient presents with   Follow-up    6 month    Subjective:   HPI: Kristine [name] is a 81 y.o. female presenting on 12/22/2021 for Follow-up (6 month)   Chief Complaint  Patient presents with   Follow-up    6 month   Hypertension:  Inadequate control of blood pressure on HCTZ 25 mg p.o. daily and valsartan 160 mg p.o. daily BP Readings from Last 3 Encounters:  12/22/21 (!) 150/70  10/05/21 140/86  09/26/21 (!) 158/61  Using medication without problems or lightheadedness:  none Chest pain with exertion: none Edema: none Short of breath: none Average home BPs: 120/68 Other issues:  Prediabetes  Lab Results  Component Value Date   HGBA1C 5.9 12/14/2021   Elevated Cholesterol: LDL at goal less than 70 on simvastatin 40 mg p.o. daily Lab Results  Component Value Date   CHOL 173 12/14/2021   HDL 55.10 12/14/2021   LDLCALC 83 12/14/2021   LDLDIRECT 105.0 06/14/2020   TRIG 171.0 (H) 12/14/2021   CHOLHDL 3 12/14/2021  Using medications without problems: Muscle aches:  Diet compliance: good Exercise: as much as tolerated Other complaints:  Wt Readings from Last 3 Encounters:  12/22/21 222 lb 8 oz (100.9 kg)  10/05/21 224 lb 6 oz (101.8 kg)  09/26/21 223 lb (101.2 kg)       She has skin lesion on left mid back. Present for > 6 months, not itchy, mildly sore.  No discharge.  Relevant past medical, surgical, family and social history reviewed and updated as indicated. Interim medical history since our last visit reviewed. Allergies and medications reviewed and updated. Outpatient Medications Prior to Visit  Medication Sig Dispense Refill   amoxicillin (AMOXIL) 500 MG capsule  Take 500 mg by mouth See admin instructions. Take 2000 mg by mouth 1 hour prior to dental procedures     colchicine 0.6 MG tablet Take 1 tablet (0.6 mg total) by mouth 2 (two) times daily as needed (for gout flare). 90 tablet 1   cyanocobalamin 2000 MCG tablet Take 2,000 mcg by mouth daily.     estrogens, conjugated, (PREMARIN) 0.625 MG tablet Take 1 tablet (0.625 mg total) by mouth every evening. 90 tablet 1   fexofenadine (ALLEGRA) 180 MG tablet Take 180 mg by mouth as needed for allergies or rhinitis.     fluticasone (FLONASE) 50 MCG/ACT nasal spray Place 2 sprays into both nostrils daily as needed for allergies.     folic acid (FOLVITE) 1 MG tablet Take 1 mg by mouth daily.     gabapentin (NEURONTIN) 600 MG tablet Take 1 tablet (600 mg total) by mouth 3 (three) times daily. 270 tablet 1   hydrochlorothiazide (HYDRODIURIL) 25 MG tablet Take 1 tablet (25 mg total) by mouth daily. 90 tablet 3   HYDROcodone-acetaminophen (NORCO/VICODIN) 5-325 MG tablet Take 1 tablet by mouth every 4 (four) hours as needed for moderate pain ((score 4 to 6)). 30 tablet 0   levothyroxine (SYNTHROID) 75 MCG tablet TAKE 1 TABLET DAILY BEFORE BREAKFAST 90 tablet 3  methocarbamol (ROBAXIN) 500 MG tablet Take 1 tablet (500 mg total) by mouth every 6 (six) hours as needed for muscle spasms. 60 tablet 0   methotrexate (RHEUMATREX) 2.5 MG tablet Take 12.5 mg by mouth every Wednesday.     montelukast (SINGULAIR) 10 MG tablet TAKE 1 TABLET AT BEDTIME 90 tablet 0   Neomy-Bacit-Polymyx-Pramoxine (TRIPLE ANTIBIOTIC PAIN RELIEF) 1 % OINT Apply 1 application topically as needed (wound care).     Omega-3 Fatty Acids (FISH OIL) 1200 MG CAPS Take 2,400 mg by mouth 2 (two) times daily.     omeprazole (PRILOSEC) 20 MG capsule Take 20 mg by mouth daily.     polyethylene glycol (MIRALAX / GLYCOLAX) packet Take 17 g by mouth daily.     simvastatin (ZOCOR) 40 MG tablet TAKE 1 TABLET ('40MG'$ ) BY MOUTH ONCE IN THE EVENING 90 tablet 1    solifenacin (VESICARE) 10 MG tablet Take 1 tablet (10 mg total) by mouth daily. 90 tablet 3   trolamine salicylate (ASPERCREME) 10 % cream Apply 1 application topically at bedtime. Applied to feet at night     valsartan (DIOVAN) 160 MG tablet Take 1 tablet (160 mg total) by mouth daily. 90 tablet 3   No facility-administered medications prior to visit.      Per HPI unless specifically indicated in ROS section below Review of Systems  Constitutional:  Negative for fatigue and fever.  HENT:  Negative for congestion.   Eyes:  Negative for pain.  Respiratory:  Negative for cough and shortness of breath.   Cardiovascular:  Negative for chest pain, palpitations and leg swelling.  Gastrointestinal:  Negative for abdominal pain.  Genitourinary:  Negative for dysuria and vaginal bleeding.  Musculoskeletal:  Negative for back pain.  Neurological:  Negative for syncope, light-headedness and headaches.  Psychiatric/Behavioral:  Negative for dysphoric mood.    Objective:  BP (!) 150/70   Pulse 84   Temp 97.7 F (36.5 C) (Oral)   Ht 5' 5.5" (1.664 m)   Wt 222 lb 8 oz (100.9 kg)   SpO2 97%   BMI 36.46 kg/m   Wt Readings from Last 3 Encounters:  12/22/21 222 lb 8 oz (100.9 kg)  10/05/21 224 lb 6 oz (101.8 kg)  09/26/21 223 lb (101.2 kg)      Physical Exam Constitutional:      General: She is not in acute distress.    Appearance: Normal appearance. She is well-developed. She is not ill-appearing or toxic-appearing.  HENT:     Head: Normocephalic.     Right Ear: Hearing, tympanic membrane, ear canal and external ear normal. Tympanic membrane is not erythematous, retracted or bulging.     Left Ear: Hearing, tympanic membrane, ear canal and external ear normal. Tympanic membrane is not erythematous, retracted or bulging.     Nose: No mucosal edema or rhinorrhea.     Right Sinus: No maxillary sinus tenderness or frontal sinus tenderness.     Left Sinus: No maxillary sinus tenderness or  frontal sinus tenderness.     Mouth/Throat:     Pharynx: Uvula midline.  Eyes:     General: Lids are normal. Lids are everted, no foreign bodies appreciated.     Conjunctiva/sclera: Conjunctivae normal.     Pupils: Pupils are equal, round, and reactive to light.  Neck:     Thyroid: No thyroid mass or thyromegaly.     Vascular: No carotid bruit.     Trachea: Trachea normal.  Cardiovascular:  Rate and Rhythm: Normal rate and regular rhythm.     Pulses: Normal pulses.     Heart sounds: Normal heart sounds, S1 normal and S2 normal. No murmur heard.    No friction rub. No gallop.  Pulmonary:     Effort: Pulmonary effort is normal. No tachypnea or respiratory distress.     Breath sounds: Normal breath sounds. No decreased breath sounds, wheezing, rhonchi or rales.  Abdominal:     General: Bowel sounds are normal.     Palpations: Abdomen is soft.     Tenderness: There is no abdominal tenderness.  Musculoskeletal:     Cervical back: Normal range of motion and neck supple.  Skin:    General: Skin is warm and dry.     Findings: No rash.  Neurological:     Mental Status: She is alert.  Psychiatric:        Mood and Affect: Mood is not anxious or depressed.        Speech: Speech normal.        Behavior: Behavior normal. Behavior is cooperative.        Thought Content: Thought content normal.        Judgment: Judgment normal.       Results for orders placed or performed in visit on 12/14/21  T3, free  Result Value Ref Range   T3, Free 2.3 2.3 - 4.2 pg/mL  Hemoglobin A1c  Result Value Ref Range   Hgb A1c MFr Bld 5.9 4.6 - 6.5 %  Lipid panel  Result Value Ref Range   Cholesterol 173 0 - 200 mg/dL   Triglycerides 171.0 (H) 0.0 - 149.0 mg/dL   HDL 55.10 >39.00 mg/dL   VLDL 34.2 0.0 - 40.0 mg/dL   LDL Cholesterol 83 0 - 99 mg/dL   Total CHOL/HDL Ratio 3    NonHDL 117.68   Comprehensive metabolic panel  Result Value Ref Range   Sodium 141 135 - 145 mEq/L   Potassium 4.3 3.5 -  5.1 mEq/L   Chloride 105 96 - 112 mEq/L   CO2 27 19 - 32 mEq/L   Glucose, Bld 106 (H) 70 - 99 mg/dL   BUN 20 6 - 23 mg/dL   Creatinine, Ser 0.76 0.40 - 1.20 mg/dL   Total Bilirubin 0.8 0.2 - 1.2 mg/dL   Alkaline Phosphatase 47 39 - 117 U/L   AST 10 0 - 37 U/L   ALT 8 0 - 35 U/L   Total Protein 6.0 6.0 - 8.3 g/dL   Albumin 4.1 3.5 - 5.2 g/dL   GFR 73.23 >60.00 mL/min   Calcium 8.9 8.4 - 10.5 mg/dL  CBC with Differential/Platelet  Result Value Ref Range   WBC 5.4 4.0 - 10.5 K/uL   RBC 4.23 3.87 - 5.11 Mil/uL   Hemoglobin 12.6 12.0 - 15.0 g/dL   HCT 37.1 36.0 - 46.0 %   MCV 87.7 78.0 - 100.0 fl   MCHC 33.9 30.0 - 36.0 g/dL   RDW 14.8 11.5 - 15.5 %   Platelets 195.0 150.0 - 400.0 K/uL   Neutrophils Relative % 58.0 43.0 - 77.0 %   Lymphocytes Relative 31.4 12.0 - 46.0 %   Monocytes Relative 5.9 3.0 - 12.0 %   Eosinophils Relative 4.0 0.0 - 5.0 %   Basophils Relative 0.7 0.0 - 3.0 %   Neutro Abs 3.2 1.4 - 7.7 K/uL   Lymphs Abs 1.7 0.7 - 4.0 K/uL   Monocytes Absolute 0.3 0.1 - 1.0  K/uL   Eosinophils Absolute 0.2 0.0 - 0.7 K/uL   Basophils Absolute 0.0 0.0 - 0.1 K/uL  TSH  Result Value Ref Range   TSH 3.58 0.35 - 5.50 uIU/mL  T4, free  Result Value Ref Range   Free T4 1.00 0.60 - 1.60 ng/dL     COVID 19 screen:  No recent travel or known exposure to [place] The patient denies respiratory symptoms of COVID 19 at this time. The importance of social distancing was discussed today.   Assessment and Plan Problem List Items Addressed This Visit     Essential hypertension, benign (Chronic)    Chronic, inadequate control of blood pressure in office today on HCTZ 25 mg p.o. daily and valsartan 100 mg p.o. daily. She feels like this blood pressure may not be representative and so would like to follow her blood pressure at home Check blood pressure over the next 2 weeks .Marland Kitchen If above 140/90 consistently.. call.      Prediabetes - Primary (Chronic)    Chronic, stable control  discussed low carbohydrate diet, activity as tolerated and continued weight loss.      Pure hypercholesterolemia (Chronic)    Chronic, well controlled LDL at goal less than 70 on simvastatin 40 mg p.o. daily      Skin lesion    Subacute, present for more than 6 months.  Try trial of topical steroid on skin.Marland Kitchen if not improving call Dermatology to set up OV.          Eliezer Lofts, MD

## 2022-01-03 ENCOUNTER — Other Ambulatory Visit: Payer: Self-pay | Admitting: Family Medicine

## 2022-01-04 NOTE — Telephone Encounter (Signed)
Last office visit 12/22/21 for Prediabetes, HTN, Pure Hypercholesterolemia and Skin Lesion.  Last refilled 06/23/2021 for #270 with 1 refill.  CPE schedule for 06/26/2022.

## 2022-01-13 NOTE — Assessment & Plan Note (Signed)
Chronic, inadequate control of blood pressure in office today on HCTZ 25 mg p.o. daily and valsartan 100 mg p.o. daily. She feels like this blood pressure may not be representative and so would like to follow her blood pressure at home Check blood pressure over the next 2 weeks .Marland Kitchen If above 140/90 consistently.. call.

## 2022-01-13 NOTE — Assessment & Plan Note (Signed)
Subacute, present for more than 6 months.  Try trial of topical steroid on skin.Marland Kitchen if not improving call Dermatology to set up OV.

## 2022-01-13 NOTE — Assessment & Plan Note (Signed)
Chronic, well controlled LDL at goal less than 70 on simvastatin 40 mg p.o. daily

## 2022-01-13 NOTE — Assessment & Plan Note (Signed)
Chronic, stable control discussed low carbohydrate diet, activity as tolerated and continued weight loss. 

## 2022-01-28 ENCOUNTER — Other Ambulatory Visit: Payer: Self-pay | Admitting: Family Medicine

## 2022-01-29 MED ORDER — MONTELUKAST SODIUM 10 MG PO TABS: 10.0000 mg | ORAL_TABLET | Freq: Every day | ORAL | 3 refills | Status: DC

## 2022-02-09 ENCOUNTER — Telehealth: Payer: Self-pay | Admitting: Family Medicine

## 2022-02-09 NOTE — Telephone Encounter (Signed)
[facility] called requesting pt last lab result be fax # [phone] . Office num. # [phone]

## 2022-02-09 NOTE — Telephone Encounter (Signed)
Results sent to [facility] Rheumat[facility] via [facility] fax.

## 2022-02-14 ENCOUNTER — Other Ambulatory Visit: Payer: Self-pay | Admitting: Otolaryngology

## 2022-02-14 DIAGNOSIS — D333 Benign neoplasm of cranial nerves: Secondary | ICD-10-CM

## 2022-02-20 ENCOUNTER — Encounter (INDEPENDENT_AMBULATORY_CARE_PROVIDER_SITE_OTHER): Payer: Federal, State, Local not specified - PPO | Admitting: Family Medicine

## 2022-02-20 DIAGNOSIS — M10022 Idiopathic gout, left elbow: Secondary | ICD-10-CM | POA: Diagnosis not present

## 2022-02-22 ENCOUNTER — Telehealth: Payer: Self-pay | Admitting: Family Medicine

## 2022-02-22 MED ORDER — PREDNISONE 20 MG PO TABS: 20.0000 mg | ORAL_TABLET | Freq: Every day | ORAL | 0 refills | Status: DC

## 2022-02-22 NOTE — Telephone Encounter (Signed)
Pt stated the meds predniSONE (DELTASONE) 20 MG tablet was prescribed for taper. Pt was wondering was that correct or no? Call back # [phone]

## 2022-02-22 NOTE — Telephone Encounter (Signed)
Please see the MyChart message reply(ies) for my assessment and plan.  The patient gave consent for this Medical Advice Message and is aware that it may result in a bill to their insurance company as well as the possibility that this may result in a co-payment or deductible. They are an established patient, but are not seeking medical advice exclusively about a problem treated during an in person or video visit in the last 7 days. I did not recommend an in person or video visit within 7 days of my reply.  I spent a total of 7 minutes cumulative time within 7 days through [employer] Eliezer Lofts, MD

## 2022-02-22 NOTE — Telephone Encounter (Signed)
Responded via MyChart.

## 2022-02-22 NOTE — Addendum Note (Signed)
Addended by: Eliezer Lofts E on: 02/22/2022 08:39 AM   Modules accepted: Orders

## 2022-03-27 ENCOUNTER — Other Ambulatory Visit: Payer: Self-pay | Admitting: Family Medicine

## 2022-05-23 ENCOUNTER — Encounter: Payer: Self-pay | Admitting: Dermatology

## 2022-05-23 ENCOUNTER — Ambulatory Visit: Payer: Federal, State, Local not specified - PPO | Admitting: Dermatology

## 2022-05-23 VITALS — BP 156/82 | HR 77

## 2022-05-23 DIAGNOSIS — D492 Neoplasm of unspecified behavior of bone, soft tissue, and skin: Secondary | ICD-10-CM

## 2022-05-23 DIAGNOSIS — C4490 Unspecified malignant neoplasm of skin, unspecified: Secondary | ICD-10-CM

## 2022-05-23 DIAGNOSIS — L578 Other skin changes due to chronic exposure to nonionizing radiation: Secondary | ICD-10-CM | POA: Diagnosis not present

## 2022-05-23 DIAGNOSIS — D235 Other benign neoplasm of skin of trunk: Secondary | ICD-10-CM | POA: Diagnosis not present

## 2022-05-23 HISTORY — DX: Unspecified malignant neoplasm of skin, unspecified: C44.90

## 2022-05-23 NOTE — Patient Instructions (Signed)
Wound Care Instructions  Cleanse wound gently with soap and water once a day then pat dry with clean gauze. Apply a thin coat of Petrolatum (petroleum jelly, "Vaseline") over the wound (unless you have an allergy to this). We recommend that you use a new, sterile tube of Vaseline. Do not pick or remove scabs. Do not remove the yellow or white "healing tissue" from the base of the wound.  Cover the wound with fresh, clean, nonstick gauze and secure with paper tape. You may use Band-Aids in place of gauze and tape if the wound is small enough, but would recommend trimming much of the tape off as there is often too much. Sometimes Band-Aids can irritate the skin.  You should call the office for your biopsy report after 1 week if you have not already been contacted.  If you experience any problems, such as abnormal amounts of bleeding, swelling, significant bruising, significant pain, or evidence of infection, please call the office immediately.  FOR ADULT SURGERY PATIENTS: If you need something for pain relief you may take 1 extra strength Tylenol (acetaminophen) AND 2 Ibuprofen (200mg each) together every 4 hours as needed for pain. (do not take these if you are allergic to them or if you have a reason you should not take them.) Typically, you may only need pain medication for 1 to 3 days.     Due to recent changes in healthcare laws, you may see results of your pathology and/or laboratory studies on MyChart before the doctors have had a chance to review them. We understand that in some cases there may be results that are confusing or concerning to you. Please understand that not all results are received at the same time and often the doctors may need to interpret multiple results in order to provide you with the best plan of care or course of treatment. Therefore, we ask that you please give us 2 business days to thoroughly review all your results before contacting the office for clarification. Should  we see a critical lab result, you will be contacted sooner.   If You Need Anything After Your Visit  If you have any questions or concerns for your doctor, please call our main line at [phone] and press option 4 to reach your doctor's medical assistant. If no one answers, please leave a voicemail as directed and we will return your call as soon as possible. Messages left after 4 pm will be answered the following business day.   You may also send us a message via MyChart. We typically respond to MyChart messages within 1-2 business days.  For prescription refills, please ask your pharmacy to contact our office. Our fax number is [phone].  If you have an urgent issue when the clinic is closed that cannot wait until the next business day, you can page your doctor at the number below.    Please note that while we do our best to be available for urgent issues outside of office hours, we are not available 24/7.   If you have an urgent issue and are unable to reach us, you may choose to seek medical care at your doctor's office, retail clinic, urgent care center, or emergency room.  If you have a medical emergency, please immediately call 911 or go to the emergency department.  Pager Numbers  - Dr. Kowalski: [phone]  - Dr. Moye: [phone]  - Dr. Stewart: [phone]  In the event of inclement weather, please call our main line at   [phone] for an update on the status of any delays or closures.  Dermatology Medication Tips: Please keep the boxes that topical medications come in in order to help keep track of the instructions about where and how to use these. Pharmacies typically print the medication instructions only on the boxes and not directly on the medication tubes.   If your medication is too expensive, please contact our office at [phone] option 4 or send us a message through MyChart.   We are unable to tell what your co-pay for medications will be in  advance as this is different depending on your insurance coverage. However, we may be able to find a substitute medication at lower cost or fill out paperwork to get insurance to cover a needed medication.   If a prior authorization is required to get your medication covered by your insurance company, please allow us 1-2 business days to complete this process.  Drug prices often vary depending on where the prescription is filled and some pharmacies may offer cheaper prices.  The website www.goodrx.com contains coupons for medications through different pharmacies. The prices here do not account for what the cost may be with help from insurance (it may be cheaper with your insurance), but the website can give you the price if you did not use any insurance.  - You can print the associated coupon and take it with your prescription to the pharmacy.  - You may also stop by our office during regular business hours and pick up a GoodRx coupon card.  - If you need your prescription sent electronically to a different pharmacy, notify our office through Kristine [name] MyChart or by phone at [phone] option 4.     Si Usted Necesita Algo Despus de Su Visita  Tambin puede enviarnos un mensaje a travs de MyChart. Por lo general respondemos a los mensajes de MyChart en el transcurso de 1 a 2 das hbiles.  Para renovar recetas, por favor pida a su farmacia que se ponga en contacto con nuestra oficina. Nuestro nmero de fax es el [phone].  Si tiene un asunto urgente cuando la clnica est cerrada y que no puede esperar hasta el siguiente da hbil, puede llamar/localizar a su doctor(a) al nmero que aparece a continuacin.   Por favor, tenga en cuenta que aunque hacemos todo lo posible para estar disponibles para asuntos urgentes fuera del horario de oficina, no estamos disponibles las 24 horas del da, los 7 das de la semana.   Si tiene un problema urgente y no puede comunicarse con nosotros, puede  optar por buscar atencin mdica  en el consultorio de su doctor(a), en una clnica privada, en un centro de atencin urgente o en una sala de emergencias.  Si tiene una emergencia mdica, por favor llame inmediatamente al 911 o vaya a la sala de emergencias.  Nmeros de bper  - Dr. Kowalski: [phone]  - Dra. Moye: [phone]  - Dra. Stewart: [phone]  En caso de inclemencias del tiempo, por favor llame a nuestra lnea principal al [phone] para una actualizacin sobre el estado de cualquier retraso o cierre.  Consejos para la medicacin en dermatologa: Por favor, guarde las cajas en las que vienen los medicamentos de uso tpico para ayudarle a seguir las instrucciones sobre dnde y cmo usarlos. Las farmacias generalmente imprimen las instrucciones del medicamento slo en las cajas y no directamente en los tubos del medicamento.   Si su medicamento es muy caro, por favor, pngase en contacto con   nuestra oficina llamando al [phone] y presione la opcin 4 o envenos un mensaje a travs de MyChart.   No podemos decirle cul ser su copago por los medicamentos por adelantado ya que esto es diferente dependiendo de la cobertura de su seguro. Sin embargo, es posible que podamos encontrar un medicamento sustituto a menor costo o llenar un formulario para que el seguro cubra el medicamento que se considera necesario.   Si se requiere una autorizacin previa para que su compaa de seguros cubra su medicamento, por favor permtanos de 1 a 2 das hbiles para completar este proceso.  Los precios de los medicamentos varan con frecuencia dependiendo del lugar de dnde se surte la receta y alguna farmacias pueden ofrecer precios ms baratos.  El sitio web www.goodrx.com tiene cupones para medicamentos de diferentes farmacias. Los precios aqu no tienen en cuenta lo que podra costar con la ayuda del seguro (puede ser ms barato con su seguro), pero el sitio web puede darle el  precio si no utiliz ningn seguro.  - Puede imprimir el cupn correspondiente y llevarlo con su receta a la farmacia.  - Tambin puede pasar por nuestra oficina durante el horario de atencin regular y recoger una tarjeta de cupones de GoodRx.  - Si necesita que su receta se enve electrnicamente a una farmacia diferente, informe a nuestra oficina a travs de MyChart de [place] o por telfono llamando al [phone] y presione la opcin 4.  

## 2022-05-23 NOTE — Progress Notes (Signed)
   Follow-Up Visit   Subjective  Kristine [name] is a 81 y.o. female who presents for the following: lesion (Dur: 1 year. Red spot. Growing slowly. Feels thick. Tender at times. Does not itch).  The patient has spots, moles and lesions to be evaluated, some may be new or changing and the patient has concerns that these could be cancer.   The following portions of the chart were reviewed this encounter and updated as appropriate:      Review of Systems: No other skin or systemic complaints except as noted in HPI or Assessment and Plan.   Objective  Well appearing patient in no apparent distress; mood and affect are within normal limits.  A focused examination was performed including back. Relevant physical exam findings are noted in the Assessment and Plan.  Left Posterior Flank 1.3 x 1.8 cm pink/orange smooth indurated plaque      Assessment & Plan  Neoplasm of skin Left Posterior Flank  Skin / nail biopsy Type of biopsy: tangential   Informed consent: discussed and consent obtained   Anesthesia: the lesion was anesthetized in a standard fashion   Anesthesia comment:  Area prepped with alcohol Anesthetic:  1% lidocaine w/ epinephrine 1-100,000 buffered w/ 8.4% NaHCO3 Instrument used: flexible razor blade   Hemostasis achieved with: pressure, aluminum chloride and electrodesiccation   Outcome: patient tolerated procedure well   Post-procedure details: wound care instructions given   Post-procedure details comment:  Ointment and small bandage applied  Specimen 1 - Surgical pathology Differential Diagnosis: granuloma annulare vs Sarcoid vs other, R/O BCC   Check Margins: No  Actinic Damage face - chronic, secondary to cumulative UV radiation exposure/sun exposure over time - diffuse erythematous macules with underlying dyspigmentation - Recommend daily broad spectrum sunscreen SPF 30+ to sun-exposed areas, reapply every 2 hours as needed.  - Recommend staying in the  shade or wearing long sleeves, sun glasses (UVA+UVB protection) and wide brim hats (4-inch brim around the entire circumference of the hat). - Call for new or changing lesions.   Return if symptoms worsen or fail to improve.  I, Emelia Salisbury, CMA, am acting as scribe for Brendolyn Patty, MD.  Documentation: I have reviewed the above documentation for accuracy and completeness, and I agree with the above.  Brendolyn Patty MD

## 2022-05-24 ENCOUNTER — Other Ambulatory Visit: Payer: Self-pay | Admitting: Family Medicine

## 2022-05-26 ENCOUNTER — Other Ambulatory Visit: Payer: Self-pay | Admitting: Family Medicine

## 2022-05-30 NOTE — Telephone Encounter (Signed)
Refill request HCTZ Last refill 06/23/21 #90/3 Last office visit 12/22/21 Upcoming appointment 05/3022 See allergy/contraindication

## 2022-06-06 ENCOUNTER — Telehealth: Payer: Self-pay

## 2022-06-06 DIAGNOSIS — D492 Neoplasm of unspecified behavior of bone, soft tissue, and skin: Secondary | ICD-10-CM

## 2022-06-06 NOTE — Telephone Encounter (Signed)
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

## 2022-06-06 NOTE — Telephone Encounter (Signed)
Advised pt of bx results.  Advised pt I would send referral to [facility] oncology.  Pt did have some questions so I advised I would have Dr. Nicole Kindred give her a call tomorrow to discuss bx results.

## 2022-06-07 ENCOUNTER — Other Ambulatory Visit: Payer: Self-pay | Admitting: Family Medicine

## 2022-06-08 ENCOUNTER — Telehealth: Payer: Self-pay | Admitting: Family Medicine

## 2022-06-08 DIAGNOSIS — D509 Iron deficiency anemia, unspecified: Secondary | ICD-10-CM

## 2022-06-08 DIAGNOSIS — E039 Hypothyroidism, unspecified: Secondary | ICD-10-CM

## 2022-06-08 DIAGNOSIS — E7800 Pure hypercholesterolemia, unspecified: Secondary | ICD-10-CM

## 2022-06-08 DIAGNOSIS — R7303 Prediabetes: Secondary | ICD-10-CM

## 2022-06-08 NOTE — Telephone Encounter (Signed)
-----  Message from Velna Hatchet, RT sent at 06/05/2022  3:41 PM EST ----- Regarding: Tue 1/23 lab Patient is scheduled for cpx, please order future labs.  Thanks, Anda Kraft

## 2022-06-08 NOTE — Telephone Encounter (Signed)
Last office visit 12/22/21 for prediabetes/htn/pure hypercholesterolemia and skin lesion.  Last refilled 01/04/22 for #270 with 1 refill.  CPE scheduled 06/26/2022.

## 2022-06-11 NOTE — Addendum Note (Signed)
Addended by: Ellamae Sia on: 06/11/2022 07:20 AM   Modules accepted: Orders

## 2022-06-12 ENCOUNTER — Other Ambulatory Visit: Payer: Self-pay | Admitting: Family Medicine

## 2022-06-12 DIAGNOSIS — Z1231 Encounter for screening mammogram for malignant neoplasm of breast: Secondary | ICD-10-CM

## 2022-06-14 ENCOUNTER — Other Ambulatory Visit: Payer: Self-pay | Admitting: Family Medicine

## 2022-06-15 ENCOUNTER — Ambulatory Visit
Admission: RE | Admit: 2022-06-15 | Discharge: 2022-06-15 | Disposition: A | Discharge status: Home / Self Care | Payer: Federal, State, Local not specified - PPO | Source: Ambulatory Visit | Attending: Radiation Oncology | Admitting: Radiation Oncology

## 2022-06-15 ENCOUNTER — Encounter: Payer: Self-pay | Admitting: Radiation Oncology

## 2022-06-15 VITALS — Temp 96.1°F | Resp 18 | Ht 65.5 in | Wt 227.8 lb

## 2022-06-15 DIAGNOSIS — E039 Hypothyroidism, unspecified: Secondary | ICD-10-CM | POA: Insufficient documentation

## 2022-06-15 DIAGNOSIS — L986 Other infiltrative disorders of the skin and subcutaneous tissue: Secondary | ICD-10-CM | POA: Insufficient documentation

## 2022-06-15 DIAGNOSIS — I10 Essential (primary) hypertension: Secondary | ICD-10-CM | POA: Insufficient documentation

## 2022-06-15 DIAGNOSIS — K219 Gastro-esophageal reflux disease without esophagitis: Secondary | ICD-10-CM | POA: Insufficient documentation

## 2022-06-15 DIAGNOSIS — G629 Polyneuropathy, unspecified: Secondary | ICD-10-CM | POA: Insufficient documentation

## 2022-06-15 DIAGNOSIS — Z79890 Hormone replacement therapy (postmenopausal): Secondary | ICD-10-CM | POA: Insufficient documentation

## 2022-06-15 DIAGNOSIS — E7800 Pure hypercholesterolemia, unspecified: Secondary | ICD-10-CM | POA: Insufficient documentation

## 2022-06-15 DIAGNOSIS — D492 Neoplasm of unspecified behavior of bone, soft tissue, and skin: Secondary | ICD-10-CM

## 2022-06-15 DIAGNOSIS — D649 Anemia, unspecified: Secondary | ICD-10-CM | POA: Diagnosis not present

## 2022-06-15 DIAGNOSIS — Z79899 Other long term (current) drug therapy: Secondary | ICD-10-CM | POA: Diagnosis not present

## 2022-06-15 NOTE — Consult Note (Signed)
NEW PATIENT EVALUATION  Name: Kristine [name]  MRN: [id number]  Date:   06/15/2022     DOB: 05/09/1941   This 82 y.o. female patient presents to the clinic for initial evaluation of left flank atypical nodular B-cell infiltrate.  REFERRING PHYSICIAN: Jinny Sanders, MD  CHIEF COMPLAINT:  Chief Complaint  Patient presents with   Skin Cancer    Consult    DIAGNOSIS: The encounter diagnosis was Neoplasm of skin of back.   PREVIOUS INVESTIGATIONS:  Pathology reports reviewed Clinical notes reviewed  HPI: Patient is a 82 year old female in overall good health who had noticed a lesion on her left posterior flank for approximately a year.  It was biopsied and pathology reports called this an atypical nodular B-cell infiltrate.  There were well-formed nodules consistent of B cells CD10 negative with differential diagnosis reactive follicular hyperplasia versus a follicular center cell lymphoma.  Patient is asymptomatic specifically denies any B symptoms weight loss.  She has no peripheral adenopathy.  Now referred to radiation collagen for opinion.  PLANNED TREATMENT REGIMEN: Lymphoma workup by medical oncology  PAST MEDICAL HISTORY:  has a past medical history of Allergy, Anal fissure, Anemia, Arthritis, Back pain, Benign tumor, Cancer ([place]), Cataract, GERD (gastroesophageal reflux disease), High cholesterol, Hypertension, Hypothyroidism, Neuropathy, Schwannoma, Sinusitis, Skin cancer (05/23/2022), Thyroid disease, and Vertigo.    PAST SURGICAL HISTORY:  Past Surgical History:  Procedure Laterality Date   ABDOMINAL HYSTERECTOMY  1996   ANTERIOR LAT LUMBAR FUSION Right 01/11/2021   Procedure: RIGHT LATERAL INTERBODY FUSION LUMBAR THREE -LUMBAR FOUR, LUMBAR FOUR- LUMBAR FIVE WITH INSTRUMENTATION AND ALLOGRAFT;  Surgeon: Phylliss Bob, MD;  Location: [facility];  Service: Orthopedics;  Laterality: Right;   BACK SURGERY  2019   L4-5    BREAST BIOPSY     BREAST EXCISIONAL BIOPSY Left 1980    benign   CATARACT EXTRACTION W/PHACO Right 09/08/2019   Procedure: CATARACT EXTRACTION PHACO AND INTRAOCULAR LENS PLACEMENT (IOC) RIGHT VIVITY LENS 8.89  01:14.2;  Surgeon: Birder Robson, MD;  Location: [facility];  Service: Ophthalmology;  Laterality: Right;  prefers later morning   CATARACT EXTRACTION W/PHACO Left 09/29/2019   Procedure: CATARACT EXTRACTION PHACO AND INTRAOCULAR LENS PLACEMENT (IOC) LEFT VIVITY 6.45  00:41.0;  Surgeon: Birder Robson, MD;  Location: [facility];  Service: Ophthalmology;  Laterality: Left;   COLONOSCOPY     DILATION AND CURETTAGE OF UTERUS     EYE SURGERY     HEMORROIDECTOMY  2006, 2009   HIP SURGERY  2011   L hip replacement   JOINT REPLACEMENT Left 2011   Total hip   LIPOMA EXCISION     LUMBAR LAMINECTOMY/DECOMPRESSION MICRODISCECTOMY N/A 08/28/2017   Procedure: Decompression L3-5, Excision of synovial cyst L4-5;  Surgeon: Melina Schools, MD;  Location: [facility];  Service: Orthopedics;  Laterality: N/A;  3.5 hrs   RECTAL POLYPECTOMY  2010   RECTOCELE REPAIR  2009   TOOTH EXTRACTION     #12   TOTAL ABDOMINAL HYSTERECTOMY W/ BILATERAL SALPINGOOPHORECTOMY     TOTAL HIP ARTHROPLASTY Right 07/13/2020   Procedure: TOTAL HIP ARTHROPLASTY ANTERIOR APPROACH;  Surgeon: Gaynelle Arabian, MD;  Location: [facility];  Service: Orthopedics;  Laterality: Right;  170mn   TUBAL LIGATION      FAMILY HISTORY: family history includes Cancer in her sister; Diabetes in her brother; Heart disease in her mother; Hyperlipidemia in her mother; Kidney disease in her father.  SOCIAL HISTORY:  reports that she has never smoked. She has  never used smokeless tobacco. She reports that she does not drink alcohol and does not use drugs.  ALLERGIES: Corn-containing products, Influenza vaccines, and Meperidine hcl  MEDICATIONS:  Current Outpatient Medications  Medication Sig Dispense Refill   amoxicillin (AMOXIL) 500 MG capsule Take 500 mg by mouth See admin  instructions. Take 2000 mg by mouth 1 hour prior to dental procedures (Patient not taking: Reported on 06/15/2022)     Azelastine HCl 137 MCG/SPRAY SOLN Place into both nostrils.     colchicine 0.6 MG tablet Take 1 tablet (0.6 mg total) by mouth 2 (two) times daily as needed (for gout flare). 90 tablet 1   cyanocobalamin 2000 MCG tablet Take 2,000 mcg by mouth daily.     fexofenadine (ALLEGRA) 180 MG tablet Take 180 mg by mouth as needed for allergies or rhinitis.     fluticasone (FLONASE) 50 MCG/ACT nasal spray Place 2 sprays into both nostrils daily as needed for allergies.     folic acid (FOLVITE) 1 MG tablet Take 2 mg by mouth daily.     gabapentin (NEURONTIN) 600 MG tablet TAKE 1 TABLET 3 TIMES A    DAY. 270 tablet 1   hydrochlorothiazide (HYDRODIURIL) 25 MG tablet TAKE 1 TABLET (25 MG TOTAL) BY MOUTH DAILY. 90 tablet 3   HYDROcodone-acetaminophen (NORCO/VICODIN) 5-325 MG tablet Take 1 tablet by mouth every 4 (four) hours as needed for moderate pain ((score 4 to 6)). 30 tablet 0   levothyroxine (SYNTHROID) 75 MCG tablet TAKE 1 TABLET DAILY BEFORE BREAKFAST 90 tablet 3   methocarbamol (ROBAXIN) 500 MG tablet Take 1 tablet (500 mg total) by mouth every 6 (six) hours as needed for muscle spasms. 60 tablet 0   methotrexate (RHEUMATREX) 2.5 MG tablet Take 12.5 mg by mouth every Wednesday.     montelukast (SINGULAIR) 10 MG tablet Take 1 tablet (10 mg total) by mouth at bedtime. 90 tablet 3   Neomy-Bacit-Polymyx-Pramoxine (TRIPLE ANTIBIOTIC PAIN RELIEF) 1 % OINT Apply 1 application topically as needed (wound care).     Omega-3 Fatty Acids (FISH OIL) 1200 MG CAPS Take 2,400 mg by mouth 2 (two) times daily.     omeprazole (PRILOSEC) 20 MG capsule Take 20 mg by mouth daily.     polyethylene glycol (MIRALAX / GLYCOLAX) packet Take 17 g by mouth daily.     predniSONE (DELTASONE) 20 MG tablet Take 1 tablet (20 mg total) by mouth daily with breakfast. 15 tablet 0   PREMARIN 0.625 MG tablet TAKE 1 TABLET  EVERY EVENING 90 tablet 0   simvastatin (ZOCOR) 40 MG tablet TAKE 1 TABLET ('40MG'$ ) BY MOUTH ONCE IN THE EVENING 90 tablet 1   solifenacin (VESICARE) 10 MG tablet Take 1 tablet (10 mg total) by mouth daily. 90 tablet 3   triamcinolone cream (KENALOG) 0.5 % Apply 1 Application topically 2 (two) times daily. (Patient not taking: Reported on 06/15/2022) 30 g 0   trolamine salicylate (ASPERCREME) 10 % cream Apply 1 application topically at bedtime. Applied to feet at night     valsartan (DIOVAN) 160 MG tablet Take 1 tablet (160 mg total) by mouth daily. 90 tablet 3   No current facility-administered medications for this encounter.    ECOG PERFORMANCE STATUS:  0 - Asymptomatic  REVIEW OF SYSTEMS: Patient denies any weight loss, fatigue, weakness, fever, chills or night sweats. Patient denies any loss of vision, blurred vision. Patient denies any ringing  of the ears or hearing loss. No irregular heartbeat. Patient denies heart murmur  or history of fainting. Patient denies any chest pain or pain radiating to her upper extremities. Patient denies any shortness of breath, difficulty breathing at night, cough or hemoptysis. Patient denies any swelling in the lower legs. Patient denies any nausea vomiting, vomiting of blood, or coffee ground material in the vomitus. Patient denies any stomach pain. Patient states has had normal bowel movements no significant constipation or diarrhea. Patient denies any dysuria, hematuria or significant nocturia. Patient denies any problems walking, swelling in the joints or loss of balance. Patient denies any skin changes, loss of hair or loss of weight. Patient denies any excessive worrying or anxiety or significant depression. Patient denies any problems with insomnia. Patient denies excessive thirst, polyuria, polydipsia. Patient denies any swollen glands, patient denies easy bruising or easy bleeding. Patient denies any recent infections, allergies or URI. Patient "s visual  fields have not changed significantly in recent time.   PHYSICAL EXAM: Temp (!) 96.1 F (35.6 C)   Resp 18   Ht 5' 5.5" (1.664 m)   Wt 227 lb 12.8 oz (103.3 kg)   BMI 37.33 kg/m  Area of recent biopsy is well-healed.  No peripheral adenopathy in the head and neck axillary supraclavicular or inguinal areas appreciated well-developed well-nourished patient in NAD. HEENT reveals PERLA, EOMI, discs not visualized.  Oral cavity is clear. No oral mucosal lesions are identified. Neck is clear without evidence of cervical or supraclavicular adenopathy. Lungs are clear to A&P. Cardiac examination is essentially unremarkable with regular rate and rhythm without murmur rub or thrill. Abdomen is benign with no organomegaly or masses noted. Motor sensory and DTR levels are equal and symmetric in the upper and lower extremities. Cranial nerves II through XII are grossly intact. Proprioception is intact. No peripheral adenopathy or edema is identified. No motor or sensory levels are noted. Crude visual fields are within normal range.  LABORATORY DATA: Pathology report reviewed    RADIOLOGY RESULTS: No current films for review   IMPRESSION: Questionable atypical nodular B-cell infiltrate of the left posterior flank in an 82 year old female  PLAN: At this time I referred the patient to medical oncology for workup.  At some point should this be a probable atypical presentation of a follicular lymphoma would recommend radiation therapy to this area.  Otherwise I will leave to medical oncology for complete workup and assessment in her treatment plan.  Patient and husband both comprehend the recommendations well.  Referral to medical oncology was made.  I would like to take this opportunity to thank you for allowing me to participate in the care of your patient.Noreene Filbert, MD

## 2022-06-19 ENCOUNTER — Inpatient Hospital Stay: Payer: Federal, State, Local not specified - PPO | Attending: Internal Medicine | Admitting: Internal Medicine

## 2022-06-19 ENCOUNTER — Other Ambulatory Visit (INDEPENDENT_AMBULATORY_CARE_PROVIDER_SITE_OTHER): Payer: Federal, State, Local not specified - PPO

## 2022-06-19 ENCOUNTER — Telehealth: Payer: Self-pay | Admitting: *Deleted

## 2022-06-19 ENCOUNTER — Inpatient Hospital Stay: Payer: Federal, State, Local not specified - PPO

## 2022-06-19 ENCOUNTER — Encounter: Payer: Self-pay | Admitting: Internal Medicine

## 2022-06-19 VITALS — BP 172/92 | HR 66 | Temp 96.7°F | Resp 16 | Ht 65.5 in | Wt 228.2 lb

## 2022-06-19 DIAGNOSIS — Z79899 Other long term (current) drug therapy: Secondary | ICD-10-CM | POA: Insufficient documentation

## 2022-06-19 DIAGNOSIS — I10 Essential (primary) hypertension: Secondary | ICD-10-CM | POA: Insufficient documentation

## 2022-06-19 DIAGNOSIS — E7800 Pure hypercholesterolemia, unspecified: Secondary | ICD-10-CM | POA: Insufficient documentation

## 2022-06-19 DIAGNOSIS — D509 Iron deficiency anemia, unspecified: Secondary | ICD-10-CM | POA: Diagnosis not present

## 2022-06-19 DIAGNOSIS — M069 Rheumatoid arthritis, unspecified: Secondary | ICD-10-CM | POA: Diagnosis not present

## 2022-06-19 DIAGNOSIS — E039 Hypothyroidism, unspecified: Secondary | ICD-10-CM

## 2022-06-19 DIAGNOSIS — R7303 Prediabetes: Secondary | ICD-10-CM | POA: Diagnosis not present

## 2022-06-19 DIAGNOSIS — C8510 Unspecified B-cell lymphoma, unspecified site: Secondary | ICD-10-CM

## 2022-06-19 DIAGNOSIS — Z7952 Long term (current) use of systemic steroids: Secondary | ICD-10-CM | POA: Diagnosis not present

## 2022-06-19 LAB — COMPREHENSIVE METABOLIC PANEL
ALT: 9 U/L (ref 0–35)
AST: 10 U/L (ref 0–37)
Albumin: 3.9 g/dL (ref 3.5–5.2)
Alkaline Phosphatase: 49 U/L (ref 39–117)
BUN: 20 mg/dL (ref 6–23)
CO2: 28 mEq/L (ref 19–32)
Calcium: 8.9 mg/dL (ref 8.4–10.5)
Chloride: 104 mEq/L (ref 96–112)
Creatinine, Ser: 0.67 mg/dL (ref 0.40–1.20)
GFR: 81.39 mL/min (ref >60.00)
Glucose, Bld: 106 mg/dL — ABNORMAL HIGH (ref 70–99)
Potassium: 3.9 mEq/L (ref 3.5–5.1)
Sodium: 141 mEq/L (ref 135–145)
Total Bilirubin: 0.4 mg/dL (ref 0.2–1.2)
Total Protein: 5.9 g/dL — ABNORMAL LOW (ref 6.0–8.3)

## 2022-06-19 LAB — CBC WITH DIFFERENTIAL/PLATELET
Basophils Absolute: 0 10*3/uL (ref 0.0–0.1)
Basophils Relative: 0.5 % (ref 0.0–3.0)
Eosinophils Absolute: 0.3 10*3/uL (ref 0.0–0.7)
Eosinophils Relative: 4.8 % (ref 0.0–5.0)
HCT: 37.2 % (ref 36.0–46.0)
Hemoglobin: 12.6 g/dL (ref 12.0–15.0)
Lymphocytes Relative: 33.5 % (ref 12.0–46.0)
Lymphs Abs: 1.8 10*3/uL (ref 0.7–4.0)
MCHC: 33.9 g/dL (ref 30.0–36.0)
MCV: 87.5 fl (ref 78.0–100.0)
Monocytes Absolute: 0.3 10*3/uL (ref 0.1–1.0)
Monocytes Relative: 6.2 % (ref 3.0–12.0)
Neutro Abs: 2.9 10*3/uL (ref 1.4–7.7)
Neutrophils Relative %: 55 % (ref 43.0–77.0)
Platelets: 222 10*3/uL (ref 150.0–400.0)
RBC: 4.25 Mil/uL (ref 3.87–5.11)
RDW: 14.2 % (ref 11.5–15.5)
WBC: 5.3 10*3/uL (ref 4.0–10.5)

## 2022-06-19 LAB — LACTATE DEHYDROGENASE: LDH: 124 U/L (ref 98–192)

## 2022-06-19 LAB — LIPID PANEL
Cholesterol: 199 mg/dL (ref 0–200)
HDL: 65.5 mg/dL (ref >39.00)
LDL Cholesterol: 96 mg/dL (ref 0–99)
NonHDL: 133.71
Total CHOL/HDL Ratio: 3
Triglycerides: 187 mg/dL — ABNORMAL HIGH (ref 0.0–149.0)
VLDL: 37.4 mg/dL (ref 0.0–40.0)

## 2022-06-19 LAB — HEMOGLOBIN A1C: Hgb A1c MFr Bld: 6 % (ref 4.6–6.5)

## 2022-06-19 LAB — T4, FREE: Free T4: 1.08 ng/dL (ref 0.60–1.60)

## 2022-06-19 LAB — T3, FREE: T3, Free: 2.4 pg/mL (ref 2.3–4.2)

## 2022-06-19 LAB — TSH: TSH: 5.64 u[IU]/mL — ABNORMAL HIGH (ref 0.35–5.50)

## 2022-06-19 NOTE — Telephone Encounter (Signed)
Call placed to Dermatology office of Dr Brendolyn Patty. Message left requesting full pathology report to be sent to Dr B via fax. I will also fax a request to there office.

## 2022-06-19 NOTE — Assessment & Plan Note (Addendum)
#  DEC 2023- Skin left posterior flank [Dr.Stewart, pathologist]- ATYPICAL NODULAR B-CELL INFILTRATE-differential includes follicular hyperplasia versus B-cell neoplasm.   # Recommend further evaluation with labs including CBC CMP [pending with PCP this morning]; check [facility].  Discussed regarding a PET scan for further evaluation of any visceral disease.  Also discussed regarding referral to surgery/a repeat excisional biopsy for further pathologic classification.  Also discussed regarding a bone marrow biopsy-however would hold off pending above workup.  # Discussed that if patient has solitary lesion-suggestive of cutaneous B-cell lymphoma-options include surveillance versus focal radiation.  However await above workup.  # RA/back pain/Neuropathy-on Mxt- on gabapentin-  # Thank you Dr. Donella Stade for allowing me to participate in the care of your pleasant patient. Please do not hesitate to contact me with questions or concerns in the interim.  # DISPOSITION: # lab- ONLY [facility]-please order  # follow up in 2-3 weeks- MD; no labs; prior PET scan-Dr.B

## 2022-06-19 NOTE — Progress Notes (Signed)
No critical labs need to be addressed urgently. We will discuss labs in detail at upcoming office visit.   

## 2022-06-19 NOTE — Progress Notes (Signed)
New patient evaluation with no acute problems or concerns.

## 2022-06-19 NOTE — Progress Notes (Signed)
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 Walking independently.   Kristine [name] 82 y.o.  female has been referred to [place] for further evaluation for cutaneous lymphoma.   Patient noted to have a pea-sized lesion on the left posterior chest wall approximately year ago.  More recently noted to have increasing size "dime sized".  Noted to have slight itching.  Symptoms did not improve with steroid ointment.   Patient further underwent evaluation with dermatology-biopsy positive for atypical B-cell infiltrate.  Otherwise patient denies any weight loss or other lumps or bumps or night sweats or fevers.  Review of Systems  Constitutional:  Negative for chills, diaphoresis, fever, malaise/fatigue and weight loss.  HENT:  Negative for nosebleeds and sore throat.   Eyes:  Negative for double vision.  Respiratory:  Negative for cough, hemoptysis, sputum production, shortness of breath and wheezing.   Cardiovascular:  Negative for chest pain, palpitations, orthopnea and leg swelling.  Gastrointestinal:  Negative for abdominal pain, blood in stool, constipation, diarrhea, heartburn, melena, nausea and vomiting.  Genitourinary:  Negative for dysuria, frequency and urgency.  Musculoskeletal:  Positive for back pain and joint pain.  Skin: Negative.  Negative for itching and rash.  Neurological:  Negative for dizziness, tingling, focal weakness, weakness and headaches.  Endo/Heme/Allergies:  Does not bruise/bleed easily.  Psychiatric/Behavioral:  Negative for depression. The patient is not nervous/anxious and  does not have insomnia.     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, L post flank   Thyroid disease    Vertigo    HX of, last episode over 18 months ago    SURGICAL HISTORY: Past Surgical History:  Procedure Laterality Date   ABDOMINAL HYSTERECTOMY  1996   ANTERIOR LAT LUMBAR FUSION Right 01/11/2021   Procedure: RIGHT LATERAL INTERBODY FUSION LUMBAR THREE -LUMBAR FOUR, LUMBAR FOUR- LUMBAR FIVE WITH INSTRUMENTATION AND ALLOGRAFT;  Surgeon: Phylliss Bob, MD;  Location: [facility];  Service: Orthopedics;  Laterality: Right;   BACK SURGERY  2019   L4-5    BREAST BIOPSY     BREAST EXCISIONAL BIOPSY Left 1980   benign   CATARACT EXTRACTION W/PHACO Right 09/08/2019   Procedure: CATARACT EXTRACTION PHACO AND INTRAOCULAR LENS PLACEMENT (IOC) RIGHT VIVITY LENS 8.89  01:14.2;  Surgeon: Birder Robson, MD;  Location: [facility];  Service: Ophthalmology;  Laterality: Right;  prefers later morning   CATARACT EXTRACTION W/PHACO Left 09/29/2019   Procedure: CATARACT EXTRACTION PHACO AND INTRAOCULAR LENS PLACEMENT (IOC) LEFT  VIVITY 6.45  00:41.0;  Surgeon: Birder Robson, MD;  Location: [facility];  Service: Ophthalmology;  Laterality: Left;   COLONOSCOPY     DILATION AND CURETTAGE OF UTERUS     EYE SURGERY     HEMORROIDECTOMY  2006, 2009   HIP SURGERY  2011   L hip replacement   JOINT REPLACEMENT Left 2011   Total hip   LIPOMA EXCISION     LUMBAR LAMINECTOMY/DECOMPRESSION MICRODISCECTOMY N/A 08/28/2017   Procedure: Decompression  L3-5, Excision of synovial cyst L4-5;  Surgeon: Melina Schools, MD;  Location: [facility];  Service: Orthopedics;  Laterality: N/A;  3.5 hrs   RECTAL POLYPECTOMY  2010   RECTOCELE REPAIR  2009   TOOTH EXTRACTION     #12   TOTAL ABDOMINAL HYSTERECTOMY W/ BILATERAL SALPINGOOPHORECTOMY     TOTAL HIP ARTHROPLASTY Right 07/13/2020   Procedure: TOTAL HIP ARTHROPLASTY ANTERIOR APPROACH;  Surgeon: Gaynelle Arabian, MD;  Location: [facility];  Service: Orthopedics;  Laterality: Right;  144mn   TUBAL LIGATION      SOCIAL HISTORY: Social History   Socioeconomic History   Marital status: Married    Spouse name: Not on file   Number of children: Not on file   Years of education: Not on file   Highest education level: Not on file  Occupational History   Not on file  Tobacco Use   Smoking status: Never   Smokeless tobacco: Never  Vaping Use   Vaping Use: Never used  Substance and Sexual Activity   Alcohol use: No   Drug use: No   Sexual activity: Not on file  Other Topics Concern   Not on file  Social History Narrative   [place] [name] from [employer] No smoking; no alcohol.    Social Determinants of Health   Financial Resource Strain: Not on file  Food Insecurity: Not on file  Transportation Needs: No Transportation Needs (06/19/2022)   PRAPARE - THydrologist(Medical): No    Lack of Transportation (Non-Medical): No  Physical Activity: Not on file  Stress: Not on file  Social Connections: Not on file  Intimate Partner Violence: Not on file    FAMILY HISTORY: Family History  Problem Relation Age of Onset   Hyperlipidemia Mother    Heart disease Mother        heart attack   Kidney disease Father    Cancer Sister        multiple myeloma   Diabetes Brother    Colon cancer Neg Hx    Esophageal cancer Neg Hx    Rectal cancer Neg Hx    Stomach cancer Neg Hx    Breast cancer Neg Hx     ALLERGIES:  is  allergic to corn-containing products, mexsana, influenza vaccines, and meperidine hcl.  MEDICATIONS:  Current Outpatient Medications  Medication Sig Dispense Refill   Azelastine HCl 137 MCG/SPRAY SOLN Place into both nostrils.     colchicine 0.6 MG tablet Take 1 tablet (0.6 mg total) by mouth 2 (two) times daily as needed (for gout flare). 90 tablet 1   cyanocobalamin 2000 MCG tablet Take 2,000 mcg by mouth daily.     fexofenadine (ALLEGRA) 180 MG tablet Take 180 mg by mouth as needed for allergies or rhinitis.     fluticasone (FLONASE) 50 MCG/ACT nasal spray Place 2 sprays into both nostrils daily as needed for allergies.     folic acid (FOLVITE) 1  MG tablet Take 2 mg by mouth daily.     gabapentin (NEURONTIN) 600 MG tablet TAKE 1 TABLET 3 TIMES A    DAY. 270 tablet 1   hydrochlorothiazide (HYDRODIURIL) 25 MG tablet TAKE 1 TABLET (25 MG TOTAL) BY MOUTH DAILY. 90 tablet 3   levothyroxine (SYNTHROID) 75 MCG tablet TAKE 1 TABLET DAILY BEFORE BREAKFAST 90 tablet 3   methocarbamol (ROBAXIN) 500 MG tablet Take 1 tablet (500 mg total) by mouth every 6 (six) hours as needed for muscle spasms. 60 tablet 0   methotrexate (RHEUMATREX) 2.5 MG tablet Take 12.5 mg by mouth every Wednesday.     montelukast (SINGULAIR) 10 MG tablet Take 1 tablet (10 mg total) by mouth at bedtime. 90 tablet 3   Neomy-Bacit-Polymyx-Pramoxine (TRIPLE ANTIBIOTIC PAIN RELIEF) 1 % OINT Apply 1 application topically as needed (wound care).     Omega-3 Fatty Acids (FISH OIL) 1200 MG CAPS Take 2,400 mg by mouth 2 (two) times daily.     omeprazole (PRILOSEC) 20 MG capsule Take 20 mg by mouth daily.     polyethylene glycol (MIRALAX / GLYCOLAX) packet Take 17 g by mouth daily.     PREMARIN 0.625 MG tablet TAKE 1 TABLET EVERY EVENING 90 tablet 0   simvastatin (ZOCOR) 40 MG tablet TAKE 1 TABLET ('40MG'$ ) BY MOUTH ONCE IN THE EVENING 90 tablet 1   solifenacin (VESICARE) 10 MG tablet Take 1 tablet (10 mg total) by mouth daily. 90 tablet 3    trolamine salicylate (ASPERCREME) 10 % cream Apply 1 application topically at bedtime. Applied to feet at night     valsartan (DIOVAN) 160 MG tablet Take 1 tablet (160 mg total) by mouth daily. 90 tablet 3   amoxicillin (AMOXIL) 500 MG capsule Take 500 mg by mouth See admin instructions. Take 2000 mg by mouth 1 hour prior to dental procedures (Patient not taking: Reported on 06/15/2022)     HYDROcodone-acetaminophen (NORCO/VICODIN) 5-325 MG tablet Take 1 tablet by mouth every 4 (four) hours as needed for moderate pain ((score 4 to 6)). (Patient not taking: Reported on 06/19/2022) 30 tablet 0   predniSONE (DELTASONE) 20 MG tablet Take 1 tablet (20 mg total) by mouth daily with breakfast. (Patient not taking: Reported on 06/19/2022) 15 tablet 0   triamcinolone cream (KENALOG) 0.5 % Apply 1 Application topically 2 (two) times daily. (Patient not taking: Reported on 06/15/2022) 30 g 0   No current facility-administered medications for this visit.    PHYSICAL EXAMINATION:   Vitals:   06/19/22 1400  BP: (!) 172/92  Pulse: 66  Resp: 16  Temp: (!) 96.7 F (35.9 C)   Filed Weights   06/19/22 1400  Weight: 228 lb 3.2 oz (103.5 kg)   Raised macular lesion noted left posterior chest wall/mid axillary line-about 2 to 3 cm sized.  Physical Exam Vitals and nursing note reviewed.  Constitutional:      Comments:     HENT:     Head: Normocephalic and atraumatic.     Mouth/Throat:     Mouth: Mucous membranes are moist.     Pharynx: No oropharyngeal exudate.  Eyes:     Pupils: Pupils are equal, round, and reactive to light.  Cardiovascular:     Rate and Rhythm: Normal rate and regular rhythm.  Pulmonary:     Effort: No respiratory distress.     Breath sounds: No wheezing.     Comments: Decreased breath sounds bilaterally at bases.  No wheeze or crackles Abdominal:  General: Bowel sounds are normal. There is no distension.     Palpations: Abdomen is soft. There is no mass.     Tenderness:  There is no abdominal tenderness. There is no guarding or rebound.  Musculoskeletal:        General: No tenderness. Normal range of motion.     Cervical back: Normal range of motion and neck supple.  Skin:    General: Skin is warm.  Neurological:     Mental Status: She is alert and oriented to person, place, and time.  Psychiatric:        Mood and Affect: Affect normal.        Judgment: Judgment normal.     LABORATORY DATA:  I have reviewed the data as listed Lab Results  Component Value Date   WBC 5.3 06/19/2022   HGB 12.6 06/19/2022   HCT 37.2 06/19/2022   MCV 87.5 06/19/2022   PLT 222.0 06/19/2022   Recent Labs    12/14/21 0937 06/19/22 0935  NA 141 141  K 4.3 3.9  CL 105 104  CO2 27 28  GLUCOSE 106* 106*  BUN 20 20  CREATININE 0.76 0.67  CALCIUM 8.9 8.9  PROT 6.0 5.9*  ALBUMIN 4.1 3.9  AST 10 10  ALT 8 9  ALKPHOS 47 49  BILITOT 0.8 0.4    RADIOGRAPHIC STUDIES: I have personally reviewed the radiological images as listed and agreed with the findings in the report. No results found.   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   # Recommend further evaluation with labs including CBC CMP [pending with PCP this morning]; check [facility].  Discussed regarding a PET scan for further evaluation of any visceral disease.  Also discussed regarding referral to surgery/a repeat excisional biopsy for further pathologic classification.  Also discussed regarding a bone marrow biopsy-however would hold off pending above workup.  # RA/back pain/Neuropathy-on Mxt- on gabapentin-  # Thank you Dr. Donella Stade for allowing me to participate in the care of your pleasant patient. Please do not hesitate to contact me with questions or concerns in the interim.  # DISPOSITION: # lab- ONLY [facility]-please order  # follow up in 2-3 weeks-  MD; no labs; prior PET scan-Dr.B  Above plan of care was discussed with patient/family in detail.  My contact information was given to the patient/family.     Cammie Sickle, MD 06/19/2022 3:38 PM

## 2022-06-26 ENCOUNTER — Telehealth: Payer: Self-pay | Admitting: Family Medicine

## 2022-06-26 ENCOUNTER — Encounter: Payer: Self-pay | Admitting: Family Medicine

## 2022-06-26 ENCOUNTER — Ambulatory Visit (INDEPENDENT_AMBULATORY_CARE_PROVIDER_SITE_OTHER): Payer: Federal, State, Local not specified - PPO | Admitting: Family Medicine

## 2022-06-26 VITALS — BP 138/60 | HR 88 | Temp 97.8°F | Ht 65.5 in | Wt 227.0 lb

## 2022-06-26 DIAGNOSIS — E039 Hypothyroidism, unspecified: Secondary | ICD-10-CM

## 2022-06-26 DIAGNOSIS — I10 Essential (primary) hypertension: Secondary | ICD-10-CM | POA: Diagnosis not present

## 2022-06-26 DIAGNOSIS — C8510 Unspecified B-cell lymphoma, unspecified site: Secondary | ICD-10-CM

## 2022-06-26 DIAGNOSIS — E7800 Pure hypercholesterolemia, unspecified: Secondary | ICD-10-CM

## 2022-06-26 DIAGNOSIS — R7303 Prediabetes: Secondary | ICD-10-CM

## 2022-06-26 DIAGNOSIS — D333 Benign neoplasm of cranial nerves: Secondary | ICD-10-CM

## 2022-06-26 DIAGNOSIS — Z0000 Encounter for general adult medical examination without abnormal findings: Secondary | ICD-10-CM | POA: Diagnosis not present

## 2022-06-26 DIAGNOSIS — Z6836 Body mass index (BMI) 36.0-36.9, adult: Secondary | ICD-10-CM

## 2022-06-26 MED ORDER — ALPRAZOLAM 0.25 MG PO TABS: 0.2500 mg | ORAL_TABLET | Freq: Every day | ORAL | 0 refills | Status: DC | PRN

## 2022-06-26 MED ORDER — METHOCARBAMOL 500 MG PO TABS: 500.0000 mg | ORAL_TABLET | Freq: Four times a day (QID) | ORAL | 0 refills | Status: DC | PRN

## 2022-06-26 MED ORDER — ROSUVASTATIN CALCIUM 10 MG PO TABS: 10.0000 mg | ORAL_TABLET | Freq: Every day | ORAL | 3 refills | Status: DC

## 2022-06-26 NOTE — Assessment & Plan Note (Signed)
Chronic, stable control discussed low carbohydrate diet, activity as tolerated and continued weight loss.

## 2022-06-26 NOTE — Assessment & Plan Note (Signed)
Stable, chronic.  Continue current medication.   Stable control on levo 75 mcg daily.

## 2022-06-26 NOTE — Addendum Note (Signed)
Addended by: Eliezer Lofts E on: 06/26/2022 05:24 PM   Modules accepted: Orders

## 2022-06-26 NOTE — Progress Notes (Signed)
Patient ID: Kristine [name], female    DOB: 01-03-1941, 82 y.o.   MRN: [id number]  This visit was conducted in person.  BP 138/60   Pulse 88   Temp 97.8 F (36.6 C) (Oral)   Ht 5' 5.5" (1.664 m)   Wt 227 lb (103 kg)   SpO2 99%   BMI 37.20 kg/m    CC:  Chief Complaint  Patient presents with   Annual Exam    Subjective:   HPI: Kristine [name] is a 82 y.o. female presenting on 06/26/2022 for Annual Exam The patient presents for  complete physical and review of chronic health problems. 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 Patient noted to have a pea-sized lesion on the left posterior chest wall approximately year ago.  More recently noted to have increasing size "dime sized".  Noted to have slight itching.  Symptoms did not improve with steroid ointment.   Patient further underwent evaluation with dermatology-biopsy positive for atypical B-cell infiltrate. Recommended PET scan and repeat incisional biopsy.  After spinal fusion.. her back pain is improved, Still with some leg pain. She is still using gabapentin 600 mg 3 times daily.  Only using hydrocodone off and on.  Not using tramadol.  Hypertension:   Improved control on  HCTZ 25 mg daily and valsartan160 mg daily BP Readings from Last 3 Encounters:  06/26/22 138/60  06/19/22 (!) 172/92  05/23/22 (!) 156/82  Using medication without problems or lightheadedness: none Chest pain with exertion: none Edema:none Short of breath:none Average home BPs: 134/76 Other issues:  Prediabetes  Lab Results  Component Value Date   HGBA1C 6.0 06/19/2022    Elevated Cholesterol:  Almost at goal on  simvastatin 40 mg daily Lab Results  Component Value Date   CHOL 199 06/19/2022   HDL 65.50 06/19/2022   LDLCALC 96 06/19/2022   LDLDIRECT 105.0 06/14/2020   TRIG 187.0 (H) 06/19/2022   CHOLHDL 3 06/19/2022  Using medications  without problems: Muscle aches: none Diet compliance:none Exercise: walking off and on Other complaints:  OAB,  previously controlled on vesicare 5 mg daily.   Hypothyroidism  Stable control on levo 75 mcg daily.  Normal free T3 and free T4 Lab Results  Component Value Date   TSH 5.64 (H) 06/19/2022     Obesity:  Wt Readings from Last 3 Encounters:  06/26/22 227 lb (103 kg)  06/19/22 228 lb 3.2 oz (103.5 kg)  06/15/22 227 lb 12.8 oz (103.3 kg)         Relevant past medical, surgical, family and social history reviewed and updated as indicated. Interim medical history since our last visit reviewed. Allergies and medications reviewed and updated. Outpatient Medications Prior to Visit  Medication Sig Dispense Refill   amoxicillin (AMOXIL) 500 MG capsule Take 500 mg by mouth See admin instructions. Take 2000 mg by mouth 1 hour prior to dental procedures     Azelastine HCl 137 MCG/SPRAY SOLN Place into both nostrils.     colchicine 0.6 MG tablet Take 1 tablet (0.6 mg total) by mouth 2 (two) times daily as needed (for gout flare). 90 tablet 1   cyanocobalamin 2000 MCG tablet Take 2,000 mcg by mouth daily.     fexofenadine (ALLEGRA) 180 MG tablet Take 180 mg by mouth as needed for allergies or rhinitis.     fluticasone (FLONASE) 50 MCG/ACT nasal spray Place 2  sprays into both nostrils daily as needed for allergies.     folic acid (FOLVITE) 1 MG tablet Take 2 mg by mouth daily.     gabapentin (NEURONTIN) 600 MG tablet TAKE 1 TABLET 3 TIMES A    DAY. 270 tablet 1   hydrochlorothiazide (HYDRODIURIL) 25 MG tablet TAKE 1 TABLET (25 MG TOTAL) BY MOUTH DAILY. 90 tablet 3   levothyroxine (SYNTHROID) 75 MCG tablet TAKE 1 TABLET DAILY BEFORE BREAKFAST 90 tablet 3   methocarbamol (ROBAXIN) 500 MG tablet Take 1 tablet (500 mg total) by mouth every 6 (six) hours as needed for muscle spasms. 60 tablet 0   methotrexate (RHEUMATREX) 2.5 MG tablet Take 12.5 mg by mouth every Wednesday.      montelukast (SINGULAIR) 10 MG tablet Take 1 tablet (10 mg total) by mouth at bedtime. 90 tablet 3   Neomy-Bacit-Polymyx-Pramoxine (TRIPLE ANTIBIOTIC PAIN RELIEF) 1 % OINT Apply 1 application topically as needed (wound care).     Omega-3 Fatty Acids (FISH OIL) 1200 MG CAPS Take 2,400 mg by mouth in the morning, at noon, and at bedtime.     omeprazole (PRILOSEC) 20 MG capsule Take 20 mg by mouth daily.     polyethylene glycol (MIRALAX / GLYCOLAX) packet Take 17 g by mouth daily.     PREMARIN 0.625 MG tablet TAKE 1 TABLET EVERY EVENING 90 tablet 0   simvastatin (ZOCOR) 40 MG tablet TAKE 1 TABLET ('40MG'$ ) BY MOUTH ONCE IN THE EVENING 90 tablet 1   solifenacin (VESICARE) 10 MG tablet Take 1 tablet (10 mg total) by mouth daily. 90 tablet 3   valsartan (DIOVAN) 160 MG tablet Take 1 tablet (160 mg total) by mouth daily. 90 tablet 3   HYDROcodone-acetaminophen (NORCO/VICODIN) 5-325 MG tablet Take 1 tablet by mouth every 4 (four) hours as needed for moderate pain ((score 4 to 6)). (Patient not taking: Reported on 06/19/2022) 30 tablet 0   predniSONE (DELTASONE) 20 MG tablet Take 1 tablet (20 mg total) by mouth daily with breakfast. (Patient not taking: Reported on 06/19/2022) 15 tablet 0   triamcinolone cream (KENALOG) 0.5 % Apply 1 Application topically 2 (two) times daily. (Patient not taking: Reported on 06/15/2022) 30 g 0   trolamine salicylate (ASPERCREME) 10 % cream Apply 1 application topically at bedtime. Applied to feet at night     No facility-administered medications prior to visit.     Per HPI unless specifically indicated in ROS section below Review of Systems  Constitutional:  Negative for fatigue and fever.  HENT:  Negative for congestion.   Eyes:  Negative for pain.  Respiratory:  Negative for cough and shortness of breath.   Cardiovascular:  Negative for chest pain, palpitations and leg swelling.  Gastrointestinal:  Negative for abdominal pain.  Genitourinary:  Negative for dysuria and  vaginal bleeding.  Musculoskeletal:  Negative for back pain.  Neurological:  Negative for syncope, light-headedness and headaches.  Psychiatric/Behavioral:  Negative for dysphoric mood.    Objective:  BP 138/60   Pulse 88   Temp 97.8 F (36.6 C) (Oral)   Ht 5' 5.5" (1.664 m)   Wt 227 lb (103 kg)   SpO2 99%   BMI 37.20 kg/m   Wt Readings from Last 3 Encounters:  06/26/22 227 lb (103 kg)  06/19/22 228 lb 3.2 oz (103.5 kg)  06/15/22 227 lb 12.8 oz (103.3 kg)      Physical Exam Vitals and nursing note reviewed.  Constitutional:      General: She  is not in acute distress.    Appearance: Normal appearance. She is well-developed. She is not ill-appearing or toxic-appearing.  HENT:     Head: Normocephalic.     Right Ear: Hearing, tympanic membrane, ear canal and external ear normal.     Left Ear: Hearing, tympanic membrane, ear canal and external ear normal.     Nose: Nose normal.  Eyes:     General: Lids are normal. Lids are everted, no foreign bodies appreciated.     Conjunctiva/sclera: Conjunctivae normal.     Pupils: Pupils are equal, round, and reactive to light.  Neck:     Thyroid: No thyroid mass or thyromegaly.     Vascular: No carotid bruit.     Trachea: Trachea normal.  Cardiovascular:     Rate and Rhythm: Normal rate and regular rhythm.     Heart sounds: Normal heart sounds, S1 normal and S2 normal. No murmur heard.    No gallop.  Pulmonary:     Effort: Pulmonary effort is normal. No respiratory distress.     Breath sounds: Normal breath sounds. No wheezing, rhonchi or rales.  Abdominal:     General: Bowel sounds are normal. There is no distension or abdominal bruit.     Palpations: Abdomen is soft. There is no fluid wave or mass.     Tenderness: There is no abdominal tenderness. There is no guarding or rebound.     Hernia: No hernia is present.  Musculoskeletal:     Cervical back: Normal range of motion and neck supple.  Lymphadenopathy:     Cervical: No  cervical adenopathy.  Skin:    General: Skin is warm and dry.     Findings: No rash.  Neurological:     Mental Status: She is alert.     Cranial Nerves: No cranial nerve deficit.     Sensory: No sensory deficit.  Psychiatric:        Mood and Affect: Mood is not anxious or depressed.        Speech: Speech normal.        Behavior: Behavior normal. Behavior is cooperative.        Judgment: Judgment normal.      Results for orders placed or performed in visit on 06/19/22  Lactate dehydrogenase  Result Value Ref Range   LDH 124 98 - 192 U/L    This visit occurred during the SARS-CoV-2 public health emergency.  Safety protocols were in place, including screening questions prior to the visit, additional usage of staff PPE, and extensive cleaning of exam room while observing appropriate contact time as indicated for disinfecting solutions.   COVID 19 screen:  No recent travel or known exposure to COVID19 The patient denies respiratory symptoms of COVID 19 at this time. The importance of social distancing was discussed today.   Assessment and Plan   The patient's preventative maintenance and recommended screening tests for an annual wellness exam were reviewed in full today. Brought up to date unless services declined.  Counselled on the importance of diet, exercise, and its role in overall health and mortality. The patient's FH and SH was reviewed, including their home life, tobacco status, and drug and alcohol status.   Vaccines: uptodate  PNA, zoster. SE to flu in past refused Pap/DVE: Pap/DVE not indicated.   asymptomatic Mammo:06/3021 , plans to continue yearly. Scheduled 07/31/22 Bone Density:06/16/21 wrist nml.. Plan repeat in 5 years Colon: 04/2018 Dr. Silverio Decamp high grade tubular adenoma.. 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      Class 2 severe  obesity due to excess calories with serious comorbidity and body mass index (BMI) of 36.0 to 36.9 in adult [facility])    Encouraged exercise, weight loss, healthy eating habits.       Essential hypertension, benign (Chronic)    Chronic, well controlled.    HCTZ 25 mg daily and valsartan160 mg daily      Hypothyroidism (Chronic)    Stable, chronic.  Continue current medication.   Stable control on levo 75 mcg daily.      Prediabetes (Chronic)    Chronic, stable control discussed low carbohydrate diet, activity as tolerated and continued weight loss.      Pure hypercholesterolemia (Chronic)    Chronic, well controlled LDL  almost at goal less than 70 on simvastatin 40 mg p.o. 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

## 2022-06-26 NOTE — Patient Instructions (Addendum)
Stop simvastatin ... Change to rosuvastatin 10 mg daily.  Keep working on healthy eating, low carb, low cholesterol and regular exercise.

## 2022-06-26 NOTE — Assessment & Plan Note (Signed)
Followed by oncology 

## 2022-06-26 NOTE — Telephone Encounter (Signed)
Called patient, notified her prescription sent to pharmacy as requested.  Valium not sent in instead alprazolam sent in prior to procedure.

## 2022-06-26 NOTE — Assessment & Plan Note (Signed)
Encouraged exercise, weight loss, healthy eating habits. ? ?

## 2022-06-26 NOTE — Assessment & Plan Note (Signed)
Chronic, well controlled.    HCTZ 25 mg daily and valsartan160 mg daily

## 2022-06-26 NOTE — Assessment & Plan Note (Addendum)
Chronic, moderate control LDL  almost  at goal less than 70 on simvastatin 40 mg p.o. daily Discussed changing to  crestor to get LDL to goal.  Encouraged exercise, weight loss, healthy eating habits.

## 2022-06-26 NOTE — Assessment & Plan Note (Signed)
Followed by ENT

## 2022-06-26 NOTE — Telephone Encounter (Signed)
Pt was wanting to see if she can get one valium and generic back spasms medication for a procedure (PEC scan) that she is having on tomorrow.  Pharm:  [facility] in [place]  Pt would like to have a call once it has been called in.

## 2022-06-27 ENCOUNTER — Ambulatory Visit
Admission: RE | Admit: 2022-06-27 | Discharge: 2022-06-27 | Disposition: A | Discharge status: Home / Self Care | Payer: Federal, State, Local not specified - PPO | Source: Ambulatory Visit | Attending: Internal Medicine | Admitting: Internal Medicine

## 2022-06-27 DIAGNOSIS — K5730 Diverticulosis of large intestine without perforation or abscess without bleeding: Secondary | ICD-10-CM | POA: Diagnosis not present

## 2022-06-27 DIAGNOSIS — C8510 Unspecified B-cell lymphoma, unspecified site: Secondary | ICD-10-CM | POA: Diagnosis present

## 2022-06-27 DIAGNOSIS — C8590 Non-Hodgkin lymphoma, unspecified, unspecified site: Secondary | ICD-10-CM | POA: Insufficient documentation

## 2022-06-27 DIAGNOSIS — I700 Atherosclerosis of aorta: Secondary | ICD-10-CM | POA: Diagnosis not present

## 2022-06-27 LAB — GLUCOSE, CAPILLARY: Glucose-Capillary: 99 mg/dL (ref 70–99)

## 2022-06-27 MED ORDER — FLUDEOXYGLUCOSE F - 18 (FDG) INJECTION
11.8000 | Freq: Once | INTRAVENOUS | Status: AC | PRN
  Administered 2022-06-27: 12.37 via INTRAVENOUS

## 2022-06-27 NOTE — Telephone Encounter (Signed)
Kristine [name] notified as instructed by telephone.

## 2022-07-03 ENCOUNTER — Telehealth: Payer: Self-pay | Admitting: *Deleted

## 2022-07-03 ENCOUNTER — Inpatient Hospital Stay: Payer: Federal, State, Local not specified - PPO | Attending: Internal Medicine | Admitting: Internal Medicine

## 2022-07-03 ENCOUNTER — Encounter: Payer: Self-pay | Admitting: Internal Medicine

## 2022-07-03 VITALS — BP 158/69 | HR 71 | Temp 97.2°F | Resp 19

## 2022-07-03 DIAGNOSIS — R5383 Other fatigue: Secondary | ICD-10-CM | POA: Insufficient documentation

## 2022-07-03 DIAGNOSIS — G629 Polyneuropathy, unspecified: Secondary | ICD-10-CM | POA: Diagnosis not present

## 2022-07-03 DIAGNOSIS — M549 Dorsalgia, unspecified: Secondary | ICD-10-CM | POA: Diagnosis not present

## 2022-07-03 DIAGNOSIS — I10 Essential (primary) hypertension: Secondary | ICD-10-CM | POA: Diagnosis not present

## 2022-07-03 DIAGNOSIS — Z79899 Other long term (current) drug therapy: Secondary | ICD-10-CM | POA: Diagnosis not present

## 2022-07-03 DIAGNOSIS — C8510 Unspecified B-cell lymphoma, unspecified site: Secondary | ICD-10-CM | POA: Insufficient documentation

## 2022-07-03 DIAGNOSIS — E039 Hypothyroidism, unspecified: Secondary | ICD-10-CM | POA: Insufficient documentation

## 2022-07-03 NOTE — Assessment & Plan Note (Addendum)
#   DEC 2023- Skin left posterior flank [Dr.Stewart, pathologist]- ATYPICAL NODULAR B-CELL INFILTRATE-differential includes follicular hyperplasia versus B-cell neoplasm.  2nd, FEB 2024- Very subtle area of FDG accumulation along the skin of the posterolateral left thoracic wall, at about the level of the inferior tip of the left scapula. No definite focal hypermetabolism in this region on PET imaging. No underlying skin thickening or cutaneous/subcutaneous abnormality on CT imaging.  No other sites of unexpected or suspicious FDG hypermetabolism on today's study.  # Appx 2cm in size;  Discussed that currently pending workup to confirm clonality/IgH testing.  Reached out to pathology/pending results from [facility].   # Discussed with the patient and husband that if above workup is suggestive of cutaneous B-cell lymphoma- options include surveillance versus focal radiation.  Will refer back to Dr. Donella Stade.  Patient can follow-up with Dr. Donella Stade and dermatology; and follow-up with [place] only as needed.  # RA/back pain/Neuropathy-on Mxt- on gabapentin-  #Incidental findings on Imaging  PET-CT ,FEB 2024: Marland Kitchen Left colonic diverticulosis without diverticulitis.; Aortic Atherosclerosis  reviewed/discussed/counseled the patient.   # DISPOSITION: # follow up as needed-  Dr.B  # I reviewed the blood work- with the patient in detail; also reviewed the imaging independently [as summarized above]; and with the patient in detail.

## 2022-07-03 NOTE — Progress Notes (Signed)
Some fatigue with decreased stamina. Appetite is good. No fevers or night sweats. No swollen lymph nodes. Here for PET results.

## 2022-07-03 NOTE — Telephone Encounter (Signed)
RN made several calls trying to track down pathologist for [facility] laboratories. I spoke with Rosann Auerbach who is going to reach out to pathologist, Tilford Pillar and [facility] to see if molecular testing for IGH/BCL-2 Gene rearrangement and B cell clonality studies have come back. Rosann Auerbach will call me back  on my cell with any new information.

## 2022-07-03 NOTE — Progress Notes (Signed)
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 Walking independently.   Kristine [name] 82 y.o.  female has been referred to [place] for further evaluation for cutaneous lymphoma; and review the results of the PET scan.   Some fatigue with decreased stamina. Appetite is good. No fevers or night sweats. No swollen lymph nodes.    Otherwise patient denies any weight loss or other lumps or bumps or night sweats or fevers.  Review of Systems  Constitutional:  Negative for chills, diaphoresis, fever, malaise/fatigue and weight loss.  HENT:  Negative for nosebleeds and sore throat.   Eyes:  Negative for double vision.  Respiratory:  Negative for cough, hemoptysis, sputum production, shortness of breath and wheezing.   Cardiovascular:  Negative for chest pain, palpitations, orthopnea and leg swelling.  Gastrointestinal:  Negative for abdominal pain, blood in stool, constipation, diarrhea, heartburn, melena, nausea and vomiting.  Genitourinary:  Negative for dysuria, frequency and urgency.  Musculoskeletal:  Positive for back pain and joint pain.  Skin: Negative.  Negative for itching and rash.  Neurological:  Negative for dizziness, tingling, focal weakness, weakness and headaches.  Endo/Heme/Allergies:  Does not bruise/bleed easily.  Psychiatric/Behavioral:  Negative for depression. The patient is not nervous/anxious and does not have  insomnia.     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, L post flank   Thyroid disease    Vertigo    HX of, last episode over 18 months ago    SURGICAL HISTORY: Past Surgical History:  Procedure Laterality Date   ABDOMINAL HYSTERECTOMY  1996   ANTERIOR LAT LUMBAR FUSION Right 01/11/2021   Procedure: RIGHT LATERAL INTERBODY FUSION LUMBAR THREE -LUMBAR FOUR, LUMBAR FOUR- LUMBAR FIVE WITH INSTRUMENTATION AND ALLOGRAFT;  Surgeon: Phylliss Bob, MD;  Location: [facility];  Service: Orthopedics;  Laterality: Right;   BACK SURGERY  2019   L4-5    BREAST BIOPSY     BREAST EXCISIONAL BIOPSY Left 1980   benign   CATARACT EXTRACTION W/PHACO Right 09/08/2019   Procedure: CATARACT EXTRACTION PHACO AND INTRAOCULAR LENS PLACEMENT (IOC) RIGHT VIVITY LENS 8.89  01:14.2;  Surgeon: Birder Robson, MD;  Location: [facility];  Service: Ophthalmology;  Laterality: Right;  prefers later morning   CATARACT EXTRACTION W/PHACO Left 09/29/2019   Procedure: CATARACT EXTRACTION PHACO AND INTRAOCULAR LENS  PLACEMENT (IOC) LEFT VIVITY 6.45  00:41.0;  Surgeon: Birder Robson, MD;  Location: [facility];  Service: Ophthalmology;  Laterality: Left;   COLONOSCOPY     DILATION AND CURETTAGE OF UTERUS     EYE SURGERY     HEMORROIDECTOMY  2006, 2009   HIP SURGERY  2011   L hip replacement   JOINT REPLACEMENT Left 2011   Total hip   LIPOMA EXCISION     LUMBAR LAMINECTOMY/DECOMPRESSION MICRODISCECTOMY N/A 08/28/2017   Procedure: Decompression L3-5, Excision of  synovial cyst L4-5;  Surgeon: Melina Schools, MD;  Location: [facility];  Service: Orthopedics;  Laterality: N/A;  3.5 hrs   RECTAL POLYPECTOMY  2010   RECTOCELE REPAIR  2009   TOOTH EXTRACTION     #12   TOTAL ABDOMINAL HYSTERECTOMY W/ BILATERAL SALPINGOOPHORECTOMY     TOTAL HIP ARTHROPLASTY Right 07/13/2020   Procedure: TOTAL HIP ARTHROPLASTY ANTERIOR APPROACH;  Surgeon: Gaynelle Arabian, MD;  Location: [facility];  Service: Orthopedics;  Laterality: Right;  171mn   TUBAL LIGATION      SOCIAL HISTORY: Social History   Socioeconomic History   Marital status: Married    Spouse name: Not on file   Number of children: Not on file   Years of education: Not on file   Highest education level: Not on file  Occupational History   Not on file  Tobacco Use   Smoking status: Never   Smokeless tobacco: Never  Vaping Use   Vaping Use: Never used  Substance and Sexual Activity   Alcohol use: No   Drug use: No   Sexual activity: Not on file  Other Topics Concern   Not on file  Social History Narrative   [place]. [name] from s[employer] No smoking; no alcohol.    Social Determinants of Health   Financial Resource Strain: Not on file  Food Insecurity: Not on file  Transportation Needs: No Transportation Needs (06/19/2022)   PRAPARE - THydrologist(Medical): No    Lack of Transportation (Non-Medical): No  Physical Activity: Not on file  Stress: Not on file  Social Connections: Not on file  Intimate Partner Violence: Not on file    FAMILY HISTORY: Family History  Problem Relation Age of Onset   Hyperlipidemia Mother    Heart disease Mother        heart attack   Kidney disease Father    Cancer Sister        multiple myeloma   Diabetes Brother    Colon cancer Neg Hx    Esophageal cancer Neg Hx    Rectal cancer Neg Hx    Stomach cancer Neg Hx    Breast cancer Neg Hx     ALLERGIES:  is allergic to  corn-containing products, mexsana, influenza vaccines, and meperidine hcl.  MEDICATIONS:  Current Outpatient Medications  Medication Sig Dispense Refill   ALPRAZolam (XANAX) 0.25 MG tablet Take 1-2 tablets (0.25-0.5 mg total) by mouth daily as needed (prior to imaging). 2 tablet 0   amoxicillin (AMOXIL) 500 MG capsule Take 500 mg by mouth See admin instructions. Take 2000 mg by mouth 1 hour prior to dental procedures     Azelastine HCl 137 MCG/SPRAY SOLN Place into both nostrils.     colchicine 0.6 MG tablet Take 1 tablet (0.6 mg total) by mouth 2 (two) times daily as needed (for gout flare). 90 tablet 1   fexofenadine (ALLEGRA) 180 MG  tablet Take 180 mg by mouth as needed for allergies or rhinitis.     fluticasone (FLONASE) 50 MCG/ACT nasal spray Place 2 sprays into both nostrils daily as needed for allergies.     folic acid (FOLVITE) 1 MG tablet Take 2 mg by mouth daily.     gabapentin (NEURONTIN) 600 MG tablet TAKE 1 TABLET 3 TIMES A    DAY. 270 tablet 1   hydrochlorothiazide (HYDRODIURIL) 25 MG tablet TAKE 1 TABLET (25 MG TOTAL) BY MOUTH DAILY. 90 tablet 3   levothyroxine (SYNTHROID) 75 MCG tablet TAKE 1 TABLET DAILY BEFORE BREAKFAST 90 tablet 3   methocarbamol (ROBAXIN) 500 MG tablet Take 1 tablet (500 mg total) by mouth every 6 (six) hours as needed for muscle spasms. 60 tablet 0   methotrexate (RHEUMATREX) 2.5 MG tablet Take 12.5 mg by mouth every Wednesday.     montelukast (SINGULAIR) 10 MG tablet Take 1 tablet (10 mg total) by mouth at bedtime. 90 tablet 3   omeprazole (PRILOSEC) 20 MG capsule Take 20 mg by mouth daily.     polyethylene glycol (MIRALAX / GLYCOLAX) packet Take 17 g by mouth daily.     PREMARIN 0.625 MG tablet TAKE 1 TABLET EVERY EVENING 90 tablet 0   rosuvastatin (CRESTOR) 10 MG tablet Take 1 tablet (10 mg total) by mouth daily. 90 tablet 3   solifenacin (VESICARE) 10 MG tablet Take 1 tablet (10 mg total) by mouth daily. 90 tablet 3   valsartan (DIOVAN) 160 MG tablet  Take 1 tablet (160 mg total) by mouth daily. 90 tablet 3   cyanocobalamin 2000 MCG tablet Take 2,000 mcg by mouth daily. (Patient not taking: Reported on 07/03/2022)     Neomy-Bacit-Polymyx-Pramoxine (TRIPLE ANTIBIOTIC PAIN RELIEF) 1 % OINT Apply 1 application topically as needed (wound care). (Patient not taking: Reported on 07/03/2022)     Omega-3 Fatty Acids (FISH OIL) 1200 MG CAPS Take 2,400 mg by mouth in the morning, at noon, and at bedtime. (Patient not taking: Reported on 07/03/2022)     No current facility-administered medications for this visit.    PHYSICAL EXAMINATION:   Vitals:   07/03/22 1359  BP: (!) 158/69  Pulse: 71  Resp: 19  Temp: (!) 97.2 F (36.2 C)  SpO2: 100%   There were no vitals filed for this visit.  Raised macular lesion noted left posterior chest wall/mid axillary line-about 2 to 3 cm sized.  Physical Exam Vitals and nursing note reviewed.  Constitutional:      Comments:     HENT:     Head: Normocephalic and atraumatic.     Mouth/Throat:     Mouth: Mucous membranes are moist.     Pharynx: No oropharyngeal exudate.  Eyes:     Pupils: Pupils are equal, round, and reactive to light.  Cardiovascular:     Rate and Rhythm: Normal rate and regular rhythm.  Pulmonary:     Effort: No respiratory distress.     Breath sounds: No wheezing.     Comments: Decreased breath sounds bilaterally at bases.  No wheeze or crackles Abdominal:     General: Bowel sounds are normal. There is no distension.     Palpations: Abdomen is soft. There is no mass.     Tenderness: There is no abdominal tenderness. There is no guarding or rebound.  Musculoskeletal:        General: No tenderness. Normal range of motion.     Cervical back: Normal range of motion and neck  supple.  Skin:    General: Skin is warm.  Neurological:     Mental Status: She is alert and oriented to person, place, and time.  Psychiatric:        Mood and Affect: Affect normal.        Judgment: Judgment  normal.     LABORATORY DATA:  I have reviewed the data as listed Lab Results  Component Value Date   WBC 5.3 06/19/2022   HGB 12.6 06/19/2022   HCT 37.2 06/19/2022   MCV 87.5 06/19/2022   PLT 222.0 06/19/2022   Recent Labs    12/14/21 0937 06/19/22 0935  NA 141 141  K 4.3 3.9  CL 105 104  CO2 27 28  GLUCOSE 106* 106*  BUN 20 20  CREATININE 0.76 0.67  CALCIUM 8.9 8.9  PROT 6.0 5.9*  ALBUMIN 4.1 3.9  AST 10 10  ALT 8 9  ALKPHOS 47 49  BILITOT 0.8 0.4    RADIOGRAPHIC STUDIES: I have personally reviewed the radiological images as listed and agreed with the findings in the report. NM PET Image Initial (PI) Skull Base To Thigh (F-18 FDG)  Result Date: 06/28/2022 CLINICAL DATA:  Initial treatment strategy for B-cell non-Hodgkin's lymphoma. Skin biopsy of left posterior flank region revealed atypical nodular B-cell infiltrate. EXAM: NUCLEAR MEDICINE PET SKULL BASE TO THIGH TECHNIQUE: 12.4 mCi F-18 FDG was injected intravenously. Full-ring PET imaging was performed from the skull base to thigh after the radiotracer. CT data was obtained and used for attenuation correction and anatomic localization. Fasting blood glucose: 99 mg/dl COMPARISON:  None Available. FINDINGS: Mediastinal blood pool activity: SUV max 2.1 Liver activity: SUV max 3.5 NECK: No hypermetabolic lymph nodes in the neck. Incidental CT findings: None. CHEST: No hypermetabolic mediastinal or hilar nodes. No suspicious pulmonary nodules on the CT scan. Incidental CT findings: Coronary artery calcification is evident. Mild to moderate atherosclerotic calcification noted in the thoracic aorta. ABDOMEN/PELVIS: No abnormal hypermetabolic activity within the liver, pancreas, adrenal glands, or spleen. No hypermetabolic lymph nodes in the abdomen or pelvis. No definite focal hypermetabolism is identified in the skin overlying the left back or left flank region. There is a very subtle area of FDG accumulation along the skin of the  posterolateral left thoracic wall, at about the level of the inferior tip of the left scapula. This demonstrates SUV max = 0.9. Incidental CT findings: Tiny hypodensity in the lateral segment left liver shows no hypermetabolism and is almost assuredly benign. No followup imaging is recommended. There is moderate atherosclerotic calcification of the abdominal aorta without aneurysm. Diverticular changes noted in the left colon without diverticulitis. SKELETON: No focal hypermetabolic activity to suggest skeletal metastasis. Incidental CT findings: Bilateral hip replacement. Status post lumbar fusion. IMPRESSION: 1. Very subtle area of FDG accumulation along the skin of the posterolateral left thoracic wall, at about the level of the inferior tip of the left scapula. No definite focal hypermetabolism in this region on PET imaging. No underlying skin thickening or cutaneous/subcutaneous abnormality on CT imaging. 2. No other sites of unexpected or suspicious FDG hypermetabolism on today's study. 3. Left colonic diverticulosis without diverticulitis. 4.  Aortic Atherosclerosis (ICD10-I70.0). Electronically Signed   By: Misty Stanley M.D.   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  2nd, FEB 2024- Very subtle area of FDG accumulation along the skin of  the posterolateral left thoracic wall, at about the level of the inferior tip of the left scapula. No definite focal hypermetabolism in this region on PET imaging. No underlying skin thickening or cutaneous/subcutaneous abnormality on CT imaging.  No other sites of unexpected or suspicious FDG hypermetabolism on today's study.  # Appx 2cm in size;  Discussed that currently pending workup to confirm clonality/IgH testing.  Reached out to pathology/pending results from [facility].    # Discussed with the patient and husband that if above workup is suggestive of cutaneous B-cell lymphoma- options include surveillance versus focal radiation.  Will refer back to Dr. Donella Stade.  Patient can follow-up with Dr. Donella Stade and dermatology; and follow-up with [place] only as needed.  # RA/back pain/Neuropathy-on Mxt- on gabapentin-  #Incidental findings on Imaging  PET-CT ,FEB 2024: Marland Kitchen Left colonic diverticulosis without diverticulitis.; Aortic Atherosclerosis  reviewed/discussed/counseled the patient.   # DISPOSITION: # follow up as needed-  Dr.B  Above plan of care was discussed with patient/family in detail.  My contact information was given to the patient/family.     Cammie Sickle, MD 07/03/2022 2:34 PM

## 2022-07-05 ENCOUNTER — Telehealth: Payer: Self-pay

## 2022-07-05 NOTE — Telephone Encounter (Signed)
Patient called regarding additional testing done from [facility] at the end of December 2023. I do see your comments from when you called patient and it states future testing were pending from the lab. I have looked in media and to see if a addendum might have came in from original biopsy. I cannot find information. Please advise.

## 2022-07-09 NOTE — Telephone Encounter (Signed)
Spoke with patient and advised her FISH Analysis and B-Cell Gene Rearrangement were negative.  Patient was seen by Dr. Rogue Bussing in Oncology last week. Patient advised she did not need to follow up with them but to follow up with either our office or Dr. Donella Stade. Patient unsure how to proceed with treatment.

## 2022-07-09 NOTE — Telephone Encounter (Signed)
Patient advised of information per Dr. Nicole Kindred. Patient would like to follow up with Dr. Baruch Gouty at this time. Advised patient to be seen every 6 months. aw

## 2022-07-09 NOTE — Telephone Encounter (Signed)
Spoke with Delsa Bern at [facility] Pathology and he is going to follow up on this situation and call me back. aw

## 2022-07-10 ENCOUNTER — Telehealth: Payer: Self-pay

## 2022-07-10 NOTE — Telephone Encounter (Signed)
-----   Message from Brendolyn Patty, MD sent at 07/09/2022  6:17 PM EST ----- FISH analysis and B cell rearrangement are both negative, which suggests this is not a lymphoma skin cancer.  Due to B cell atypia, recommend further treatment to remove residual lesion, either radiation at [facility] or in-office surgery here.   - please call patient

## 2022-07-10 NOTE — Telephone Encounter (Signed)
Left pt msg to call for bx results/sh 

## 2022-07-17 ENCOUNTER — Other Ambulatory Visit: Payer: Self-pay | Admitting: Family Medicine

## 2022-07-18 ENCOUNTER — Encounter: Payer: Self-pay | Admitting: Radiation Oncology

## 2022-07-18 ENCOUNTER — Ambulatory Visit
Admission: RE | Admit: 2022-07-18 | Discharge: 2022-07-18 | Disposition: A | Discharge status: Home / Self Care | Payer: Federal, State, Local not specified - PPO | Source: Ambulatory Visit | Attending: Radiation Oncology | Admitting: Radiation Oncology

## 2022-07-18 VITALS — BP 186/91 | HR 76 | Temp 97.4°F | Resp 16 | Ht 66.0 in | Wt 227.0 lb

## 2022-07-18 DIAGNOSIS — D492 Neoplasm of unspecified behavior of bone, soft tissue, and skin: Secondary | ICD-10-CM | POA: Insufficient documentation

## 2022-07-18 NOTE — Progress Notes (Signed)
Radiation Oncology Follow up Note  Name: Kristine [name]   Date:   07/18/2022 MRN:  [id number] DOB: Apr 17, 1941    This 82 y.o. female presents to the clinic today for reevaluation of a left flank and nodule biopsy positive for atypical nodular B-cell infiltrate.  REFERRING PROVIDER: Jinny Sanders, MD  HPI: Patient is an 82 year old female who presented with a slightly raised erythematous nodule in the left flank ischial biopsy was positive for atypical nodular B-cell infiltrate versus B-cell lymphoma..  PET/CT scan was performed showing subtle area of hypermetabolic activity in the area of this nodule.  No other evidence of suspicious hypermetabolic lymphadenopathy was noted.  FISH analysis and B-cell gene rearrangement arrangement were negative.  She has declined further resection and is now referred back to radiation oncology for consideration of treatment.  She is asymptomatic specifically denies fever chills night sweats.  COMPLICATIONS OF TREATMENT: none  FOLLOW UP COMPLIANCE: keeps appointments   PHYSICAL EXAM:  BP (!) 186/91 (BP Location: Right Arm, Patient Position: Sitting, Cuff Size: Normal)   Pulse 76   Temp (!) 97.4 F (36.3 C) (Tympanic)   Resp 16   Ht 5' 6"$  (1.676 m) Comment: stated Ht  Wt 227 lb (103 kg)   BMI 36.64 kg/m  Slightly raised erythematous nodule unchanged from prior examination in the left flank.  No evidence of axillary supraclavicular inguinal adenopathy is noted.  Well-developed well-nourished patient in NAD. HEENT reveals PERLA, EOMI, discs not visualized.  Oral cavity is clear. No oral mucosal lesions are identified. Neck is clear without evidence of cervical or supraclavicular adenopathy. Lungs are clear to A&P. Cardiac examination is essentially unremarkable with regular rate and rhythm without murmur rub or thrill. Abdomen is benign with no organomegaly or masses noted. Motor sensory and DTR levels are equal and symmetric in the upper and lower  extremities. Cranial nerves II through XII are grossly intact. Proprioception is intact. No peripheral adenopathy or edema is identified. No motor or sensory levels are noted. Crude visual fields are within normal range.  RADIOLOGY RESULTS: PET CT scan reviewed compatible with above-stated findings  PLAN: Present time elect go ahead with electron-beam therapy to this lesion would plan on delivering 40 Gray over 4 weeks.  Based on the extremely small side effect profile of electron-beam therapy and the uncertainty of this lesion believe this will be the best way to treat this lesion at this time especially in view of that there is no other evidence of PET positive findings.  Risks and benefits of treatment including possible slight erythematous change of the skin in the treatment area were reviewed with the patient and her husband.  I have personally set up and ordered CT simulation for next week.  Patient comprehends my recommendations well.  I would like to take this opportunity to thank you for allowing me to participate in the care of your patient.Noreene Filbert, MD

## 2022-07-25 ENCOUNTER — Ambulatory Visit
Admission: RE | Admit: 2022-07-25 | Discharge: 2022-07-25 | Disposition: A | Discharge status: Home / Self Care | Payer: Federal, State, Local not specified - PPO | Source: Ambulatory Visit | Attending: Radiation Oncology | Admitting: Radiation Oncology

## 2022-07-25 DIAGNOSIS — D492 Neoplasm of unspecified behavior of bone, soft tissue, and skin: Secondary | ICD-10-CM | POA: Diagnosis not present

## 2022-07-27 DIAGNOSIS — D492 Neoplasm of unspecified behavior of bone, soft tissue, and skin: Secondary | ICD-10-CM | POA: Insufficient documentation

## 2022-07-27 DIAGNOSIS — Z510 Encounter for antineoplastic radiation therapy: Secondary | ICD-10-CM | POA: Diagnosis present

## 2022-07-31 ENCOUNTER — Ambulatory Visit
Admission: RE | Admit: 2022-07-31 | Discharge: 2022-07-31 | Disposition: A | Discharge status: Home / Self Care | Payer: Federal, State, Local not specified - PPO | Source: Ambulatory Visit | Attending: Family Medicine | Admitting: Family Medicine

## 2022-07-31 DIAGNOSIS — Z1231 Encounter for screening mammogram for malignant neoplasm of breast: Secondary | ICD-10-CM

## 2022-08-06 ENCOUNTER — Other Ambulatory Visit: Payer: Self-pay

## 2022-08-06 ENCOUNTER — Ambulatory Visit
Admission: RE | Admit: 2022-08-06 | Discharge: 2022-08-06 | Disposition: A | Discharge status: Home / Self Care | Payer: Federal, State, Local not specified - PPO | Source: Ambulatory Visit | Attending: Radiation Oncology | Admitting: Radiation Oncology

## 2022-08-06 DIAGNOSIS — D492 Neoplasm of unspecified behavior of bone, soft tissue, and skin: Secondary | ICD-10-CM | POA: Diagnosis not present

## 2022-08-06 LAB — RAD ONC ARIA SESSION SUMMARY
Course Elapsed Days: 0
Plan Fractions Treated to Date: 1
Plan Prescribed Dose Per Fraction: 2 Gy
Plan Total Fractions Prescribed: 20
Plan Total Prescribed Dose: 40 Gy
Reference Point Dosage Given to Date: 2 Gy
Reference Point Session Dosage Given: 2 Gy
Session Number: 1

## 2022-08-07 ENCOUNTER — Other Ambulatory Visit: Payer: Self-pay

## 2022-08-07 ENCOUNTER — Ambulatory Visit: Payer: Federal, State, Local not specified - PPO | Admitting: Dermatology

## 2022-08-07 ENCOUNTER — Ambulatory Visit
Admission: RE | Admit: 2022-08-07 | Discharge: 2022-08-07 | Disposition: A | Discharge status: Home / Self Care | Payer: Federal, State, Local not specified - PPO | Source: Ambulatory Visit | Attending: Radiation Oncology | Admitting: Radiation Oncology

## 2022-08-07 DIAGNOSIS — D492 Neoplasm of unspecified behavior of bone, soft tissue, and skin: Secondary | ICD-10-CM | POA: Diagnosis not present

## 2022-08-07 LAB — RAD ONC ARIA SESSION SUMMARY
Course Elapsed Days: 1
Plan Fractions Treated to Date: 2
Plan Prescribed Dose Per Fraction: 2 Gy
Plan Total Fractions Prescribed: 20
Plan Total Prescribed Dose: 40 Gy
Reference Point Dosage Given to Date: 4 Gy
Reference Point Session Dosage Given: 2 Gy
Session Number: 2

## 2022-08-08 ENCOUNTER — Other Ambulatory Visit: Payer: Self-pay | Admitting: Family Medicine

## 2022-08-08 ENCOUNTER — Ambulatory Visit
Admission: RE | Admit: 2022-08-08 | Discharge: 2022-08-08 | Disposition: A | Discharge status: Home / Self Care | Payer: Federal, State, Local not specified - PPO | Source: Ambulatory Visit | Attending: Radiation Oncology | Admitting: Radiation Oncology

## 2022-08-08 ENCOUNTER — Other Ambulatory Visit: Payer: Self-pay

## 2022-08-08 DIAGNOSIS — D492 Neoplasm of unspecified behavior of bone, soft tissue, and skin: Secondary | ICD-10-CM | POA: Diagnosis not present

## 2022-08-08 LAB — RAD ONC ARIA SESSION SUMMARY
Course Elapsed Days: 2
Plan Fractions Treated to Date: 3
Plan Prescribed Dose Per Fraction: 2 Gy
Plan Total Fractions Prescribed: 20
Plan Total Prescribed Dose: 40 Gy
Reference Point Dosage Given to Date: 6 Gy
Reference Point Session Dosage Given: 2 Gy
Session Number: 3

## 2022-08-09 ENCOUNTER — Other Ambulatory Visit: Payer: Self-pay

## 2022-08-09 ENCOUNTER — Ambulatory Visit
Admission: RE | Admit: 2022-08-09 | Discharge: 2022-08-09 | Disposition: A | Discharge status: Home / Self Care | Payer: Federal, State, Local not specified - PPO | Source: Ambulatory Visit | Attending: Radiation Oncology | Admitting: Radiation Oncology

## 2022-08-09 DIAGNOSIS — D492 Neoplasm of unspecified behavior of bone, soft tissue, and skin: Secondary | ICD-10-CM | POA: Diagnosis not present

## 2022-08-09 LAB — RAD ONC ARIA SESSION SUMMARY
Course Elapsed Days: 3
Plan Fractions Treated to Date: 4
Plan Prescribed Dose Per Fraction: 2 Gy
Plan Total Fractions Prescribed: 20
Plan Total Prescribed Dose: 40 Gy
Reference Point Dosage Given to Date: 8 Gy
Reference Point Session Dosage Given: 2 Gy
Session Number: 4

## 2022-08-10 ENCOUNTER — Ambulatory Visit
Admission: RE | Admit: 2022-08-10 | Discharge: 2022-08-10 | Disposition: A | Discharge status: Home / Self Care | Payer: Federal, State, Local not specified - PPO | Source: Ambulatory Visit | Attending: Radiation Oncology | Admitting: Radiation Oncology

## 2022-08-10 ENCOUNTER — Other Ambulatory Visit: Payer: Self-pay

## 2022-08-10 DIAGNOSIS — D492 Neoplasm of unspecified behavior of bone, soft tissue, and skin: Secondary | ICD-10-CM | POA: Diagnosis not present

## 2022-08-10 LAB — RAD ONC ARIA SESSION SUMMARY
Course Elapsed Days: 4
Plan Fractions Treated to Date: 5
Plan Prescribed Dose Per Fraction: 2 Gy
Plan Total Fractions Prescribed: 20
Plan Total Prescribed Dose: 40 Gy
Reference Point Dosage Given to Date: 10 Gy
Reference Point Session Dosage Given: 2 Gy
Session Number: 5

## 2022-08-11 ENCOUNTER — Other Ambulatory Visit: Payer: Self-pay | Admitting: Family Medicine

## 2022-08-11 NOTE — Telephone Encounter (Signed)
Last office visit 06/26/22 for CPE.  Last 06/26/22 for  #60 with no refills.  Next Appt: 09/25/22 for follow up HTN/CHOL.

## 2022-08-13 ENCOUNTER — Ambulatory Visit
Admission: RE | Admit: 2022-08-13 | Discharge: 2022-08-13 | Disposition: A | Discharge status: Home / Self Care | Payer: Federal, State, Local not specified - PPO | Source: Ambulatory Visit | Attending: Radiation Oncology | Admitting: Radiation Oncology

## 2022-08-13 ENCOUNTER — Other Ambulatory Visit: Payer: Self-pay

## 2022-08-13 DIAGNOSIS — D492 Neoplasm of unspecified behavior of bone, soft tissue, and skin: Secondary | ICD-10-CM | POA: Diagnosis not present

## 2022-08-13 LAB — RAD ONC ARIA SESSION SUMMARY
Course Elapsed Days: 7
Plan Fractions Treated to Date: 6
Plan Prescribed Dose Per Fraction: 2 Gy
Plan Total Fractions Prescribed: 20
Plan Total Prescribed Dose: 40 Gy
Reference Point Dosage Given to Date: 12 Gy
Reference Point Session Dosage Given: 2 Gy
Session Number: 6

## 2022-08-14 ENCOUNTER — Ambulatory Visit
Admission: RE | Admit: 2022-08-14 | Discharge: 2022-08-14 | Disposition: A | Discharge status: Home / Self Care | Payer: Federal, State, Local not specified - PPO | Source: Ambulatory Visit | Attending: Radiation Oncology | Admitting: Radiation Oncology

## 2022-08-14 ENCOUNTER — Other Ambulatory Visit: Payer: Self-pay

## 2022-08-14 DIAGNOSIS — D492 Neoplasm of unspecified behavior of bone, soft tissue, and skin: Secondary | ICD-10-CM | POA: Diagnosis not present

## 2022-08-14 LAB — RAD ONC ARIA SESSION SUMMARY
Course Elapsed Days: 8
Plan Fractions Treated to Date: 7
Plan Prescribed Dose Per Fraction: 2 Gy
Plan Total Fractions Prescribed: 20
Plan Total Prescribed Dose: 40 Gy
Reference Point Dosage Given to Date: 14 Gy
Reference Point Session Dosage Given: 2 Gy
Session Number: 7

## 2022-08-15 ENCOUNTER — Ambulatory Visit
Admission: RE | Admit: 2022-08-15 | Discharge: 2022-08-15 | Disposition: A | Discharge status: Home / Self Care | Payer: Federal, State, Local not specified - PPO | Source: Ambulatory Visit | Attending: Radiation Oncology | Admitting: Radiation Oncology

## 2022-08-15 ENCOUNTER — Other Ambulatory Visit: Payer: Self-pay

## 2022-08-15 DIAGNOSIS — D492 Neoplasm of unspecified behavior of bone, soft tissue, and skin: Secondary | ICD-10-CM | POA: Diagnosis not present

## 2022-08-15 LAB — RAD ONC ARIA SESSION SUMMARY
Course Elapsed Days: 9
Plan Fractions Treated to Date: 8
Plan Prescribed Dose Per Fraction: 2 Gy
Plan Total Fractions Prescribed: 20
Plan Total Prescribed Dose: 40 Gy
Reference Point Dosage Given to Date: 16 Gy
Reference Point Session Dosage Given: 2 Gy
Session Number: 8

## 2022-08-16 ENCOUNTER — Other Ambulatory Visit: Payer: Self-pay

## 2022-08-16 ENCOUNTER — Ambulatory Visit
Admission: RE | Admit: 2022-08-16 | Discharge: 2022-08-16 | Disposition: A | Discharge status: Home / Self Care | Payer: Federal, State, Local not specified - PPO | Source: Ambulatory Visit | Attending: Radiation Oncology | Admitting: Radiation Oncology

## 2022-08-16 DIAGNOSIS — D492 Neoplasm of unspecified behavior of bone, soft tissue, and skin: Secondary | ICD-10-CM | POA: Diagnosis not present

## 2022-08-16 LAB — RAD ONC ARIA SESSION SUMMARY
Course Elapsed Days: 10
Plan Fractions Treated to Date: 9
Plan Prescribed Dose Per Fraction: 2 Gy
Plan Total Fractions Prescribed: 20
Plan Total Prescribed Dose: 40 Gy
Reference Point Dosage Given to Date: 18 Gy
Reference Point Session Dosage Given: 2 Gy
Session Number: 9

## 2022-08-17 ENCOUNTER — Ambulatory Visit
Admission: RE | Admit: 2022-08-17 | Discharge: 2022-08-17 | Disposition: A | Discharge status: Home / Self Care | Payer: Federal, State, Local not specified - PPO | Source: Ambulatory Visit | Attending: Radiation Oncology | Admitting: Radiation Oncology

## 2022-08-17 ENCOUNTER — Other Ambulatory Visit: Payer: Self-pay

## 2022-08-17 DIAGNOSIS — D492 Neoplasm of unspecified behavior of bone, soft tissue, and skin: Secondary | ICD-10-CM | POA: Diagnosis not present

## 2022-08-17 LAB — RAD ONC ARIA SESSION SUMMARY
Course Elapsed Days: 11
Plan Fractions Treated to Date: 10
Plan Prescribed Dose Per Fraction: 2 Gy
Plan Total Fractions Prescribed: 20
Plan Total Prescribed Dose: 40 Gy
Reference Point Dosage Given to Date: 20 Gy
Reference Point Session Dosage Given: 2 Gy
Session Number: 10

## 2022-08-20 ENCOUNTER — Ambulatory Visit
Admission: RE | Admit: 2022-08-20 | Discharge: 2022-08-20 | Disposition: A | Discharge status: Home / Self Care | Payer: Federal, State, Local not specified - PPO | Source: Ambulatory Visit | Attending: Radiation Oncology | Admitting: Radiation Oncology

## 2022-08-20 ENCOUNTER — Other Ambulatory Visit: Payer: Self-pay

## 2022-08-20 DIAGNOSIS — D492 Neoplasm of unspecified behavior of bone, soft tissue, and skin: Secondary | ICD-10-CM | POA: Diagnosis not present

## 2022-08-20 LAB — RAD ONC ARIA SESSION SUMMARY
Course Elapsed Days: 14
Plan Fractions Treated to Date: 11
Plan Prescribed Dose Per Fraction: 2 Gy
Plan Total Fractions Prescribed: 20
Plan Total Prescribed Dose: 40 Gy
Reference Point Dosage Given to Date: 22 Gy
Reference Point Session Dosage Given: 2 Gy
Session Number: 11

## 2022-08-21 ENCOUNTER — Other Ambulatory Visit: Payer: Self-pay

## 2022-08-21 ENCOUNTER — Ambulatory Visit
Admission: RE | Admit: 2022-08-21 | Discharge: 2022-08-21 | Disposition: A | Discharge status: Home / Self Care | Payer: Federal, State, Local not specified - PPO | Source: Ambulatory Visit | Attending: Radiation Oncology | Admitting: Radiation Oncology

## 2022-08-21 DIAGNOSIS — D492 Neoplasm of unspecified behavior of bone, soft tissue, and skin: Secondary | ICD-10-CM | POA: Diagnosis not present

## 2022-08-21 LAB — RAD ONC ARIA SESSION SUMMARY
Course Elapsed Days: 15
Plan Fractions Treated to Date: 12
Plan Prescribed Dose Per Fraction: 2 Gy
Plan Total Fractions Prescribed: 20
Plan Total Prescribed Dose: 40 Gy
Reference Point Dosage Given to Date: 24 Gy
Reference Point Session Dosage Given: 2 Gy
Session Number: 12

## 2022-08-22 ENCOUNTER — Ambulatory Visit
Admission: RE | Admit: 2022-08-22 | Discharge: 2022-08-22 | Disposition: A | Discharge status: Home / Self Care | Payer: Federal, State, Local not specified - PPO | Source: Ambulatory Visit | Attending: Radiation Oncology | Admitting: Radiation Oncology

## 2022-08-22 ENCOUNTER — Other Ambulatory Visit: Payer: Self-pay

## 2022-08-22 DIAGNOSIS — D492 Neoplasm of unspecified behavior of bone, soft tissue, and skin: Secondary | ICD-10-CM | POA: Diagnosis not present

## 2022-08-22 LAB — RAD ONC ARIA SESSION SUMMARY
Course Elapsed Days: 16
Plan Fractions Treated to Date: 13
Plan Prescribed Dose Per Fraction: 2 Gy
Plan Total Fractions Prescribed: 20
Plan Total Prescribed Dose: 40 Gy
Reference Point Dosage Given to Date: 26 Gy
Reference Point Session Dosage Given: 2 Gy
Session Number: 13

## 2022-08-23 ENCOUNTER — Other Ambulatory Visit: Payer: Self-pay

## 2022-08-23 ENCOUNTER — Ambulatory Visit
Admission: RE | Admit: 2022-08-23 | Discharge: 2022-08-23 | Disposition: A | Discharge status: Home / Self Care | Payer: Federal, State, Local not specified - PPO | Source: Ambulatory Visit | Attending: Radiation Oncology | Admitting: Radiation Oncology

## 2022-08-23 DIAGNOSIS — D492 Neoplasm of unspecified behavior of bone, soft tissue, and skin: Secondary | ICD-10-CM | POA: Diagnosis not present

## 2022-08-23 LAB — RAD ONC ARIA SESSION SUMMARY
Course Elapsed Days: 17
Plan Fractions Treated to Date: 14
Plan Prescribed Dose Per Fraction: 2 Gy
Plan Total Fractions Prescribed: 20
Plan Total Prescribed Dose: 40 Gy
Reference Point Dosage Given to Date: 28 Gy
Reference Point Session Dosage Given: 2 Gy
Session Number: 14

## 2022-08-24 ENCOUNTER — Ambulatory Visit
Admission: RE | Admit: 2022-08-24 | Discharge: 2022-08-24 | Disposition: A | Discharge status: Home / Self Care | Payer: Federal, State, Local not specified - PPO | Source: Ambulatory Visit | Attending: Radiation Oncology | Admitting: Radiation Oncology

## 2022-08-24 ENCOUNTER — Other Ambulatory Visit: Payer: Self-pay

## 2022-08-24 DIAGNOSIS — D492 Neoplasm of unspecified behavior of bone, soft tissue, and skin: Secondary | ICD-10-CM | POA: Diagnosis not present

## 2022-08-24 LAB — RAD ONC ARIA SESSION SUMMARY
Course Elapsed Days: 18
Plan Fractions Treated to Date: 15
Plan Prescribed Dose Per Fraction: 2 Gy
Plan Total Fractions Prescribed: 20
Plan Total Prescribed Dose: 40 Gy
Reference Point Dosage Given to Date: 30 Gy
Reference Point Session Dosage Given: 2 Gy
Session Number: 15

## 2022-08-27 ENCOUNTER — Ambulatory Visit
Admission: RE | Admit: 2022-08-27 | Discharge: 2022-08-27 | Disposition: A | Discharge status: Home / Self Care | Payer: Federal, State, Local not specified - PPO | Source: Ambulatory Visit | Attending: Radiation Oncology | Admitting: Radiation Oncology

## 2022-08-27 ENCOUNTER — Other Ambulatory Visit: Payer: Self-pay

## 2022-08-27 ENCOUNTER — Telehealth: Payer: Self-pay | Admitting: *Deleted

## 2022-08-27 DIAGNOSIS — D492 Neoplasm of unspecified behavior of bone, soft tissue, and skin: Secondary | ICD-10-CM | POA: Insufficient documentation

## 2022-08-27 DIAGNOSIS — E7800 Pure hypercholesterolemia, unspecified: Secondary | ICD-10-CM

## 2022-08-27 DIAGNOSIS — Z510 Encounter for antineoplastic radiation therapy: Secondary | ICD-10-CM | POA: Insufficient documentation

## 2022-08-27 DIAGNOSIS — R7303 Prediabetes: Secondary | ICD-10-CM

## 2022-08-27 DIAGNOSIS — E039 Hypothyroidism, unspecified: Secondary | ICD-10-CM

## 2022-08-27 DIAGNOSIS — I10 Essential (primary) hypertension: Secondary | ICD-10-CM

## 2022-08-27 LAB — RAD ONC ARIA SESSION SUMMARY
Course Elapsed Days: 21
Plan Fractions Treated to Date: 16
Plan Prescribed Dose Per Fraction: 2 Gy
Plan Total Fractions Prescribed: 20
Plan Total Prescribed Dose: 40 Gy
Reference Point Dosage Given to Date: 32 Gy
Reference Point Session Dosage Given: 2 Gy
Session Number: 16

## 2022-08-27 NOTE — Telephone Encounter (Signed)
-----   Message from Ellamae Sia sent at 08/27/2022 11:45 AM EDT ----- Regarding: Lab orders for Monday, 4.22.24 Lab orders for a 3 month follow up appt.

## 2022-08-28 ENCOUNTER — Other Ambulatory Visit: Payer: Self-pay

## 2022-08-28 ENCOUNTER — Ambulatory Visit
Admission: RE | Admit: 2022-08-28 | Discharge: 2022-08-28 | Disposition: A | Discharge status: Home / Self Care | Payer: Federal, State, Local not specified - PPO | Source: Ambulatory Visit | Attending: Radiation Oncology | Admitting: Radiation Oncology

## 2022-08-28 DIAGNOSIS — D492 Neoplasm of unspecified behavior of bone, soft tissue, and skin: Secondary | ICD-10-CM | POA: Diagnosis not present

## 2022-08-28 LAB — RAD ONC ARIA SESSION SUMMARY
Course Elapsed Days: 22
Plan Fractions Treated to Date: 17
Plan Prescribed Dose Per Fraction: 2 Gy
Plan Total Fractions Prescribed: 20
Plan Total Prescribed Dose: 40 Gy
Reference Point Dosage Given to Date: 34 Gy
Reference Point Session Dosage Given: 2 Gy
Session Number: 17

## 2022-08-29 ENCOUNTER — Ambulatory Visit
Admission: RE | Admit: 2022-08-29 | Discharge: 2022-08-29 | Disposition: A | Discharge status: Home / Self Care | Payer: Federal, State, Local not specified - PPO | Source: Ambulatory Visit | Attending: Radiation Oncology | Admitting: Radiation Oncology

## 2022-08-29 ENCOUNTER — Other Ambulatory Visit: Payer: Self-pay

## 2022-08-29 DIAGNOSIS — D492 Neoplasm of unspecified behavior of bone, soft tissue, and skin: Secondary | ICD-10-CM | POA: Diagnosis not present

## 2022-08-29 LAB — RAD ONC ARIA SESSION SUMMARY
Course Elapsed Days: 23
Plan Fractions Treated to Date: 18
Plan Prescribed Dose Per Fraction: 2 Gy
Plan Total Fractions Prescribed: 20
Plan Total Prescribed Dose: 40 Gy
Reference Point Dosage Given to Date: 36 Gy
Reference Point Session Dosage Given: 2 Gy
Session Number: 18

## 2022-08-30 ENCOUNTER — Other Ambulatory Visit: Payer: Self-pay

## 2022-08-30 ENCOUNTER — Ambulatory Visit
Admission: RE | Admit: 2022-08-30 | Discharge: 2022-08-30 | Disposition: A | Discharge status: Home / Self Care | Payer: Federal, State, Local not specified - PPO | Source: Ambulatory Visit | Attending: Radiation Oncology | Admitting: Radiation Oncology

## 2022-08-30 DIAGNOSIS — D492 Neoplasm of unspecified behavior of bone, soft tissue, and skin: Secondary | ICD-10-CM | POA: Diagnosis not present

## 2022-08-30 LAB — RAD ONC ARIA SESSION SUMMARY
Course Elapsed Days: 24
Plan Fractions Treated to Date: 19
Plan Prescribed Dose Per Fraction: 2 Gy
Plan Total Fractions Prescribed: 20
Plan Total Prescribed Dose: 40 Gy
Reference Point Dosage Given to Date: 38 Gy
Reference Point Session Dosage Given: 2 Gy
Session Number: 19

## 2022-08-31 ENCOUNTER — Ambulatory Visit
Admission: RE | Admit: 2022-08-31 | Discharge: 2022-08-31 | Disposition: A | Discharge status: Home / Self Care | Payer: Federal, State, Local not specified - PPO | Source: Ambulatory Visit | Attending: Radiation Oncology | Admitting: Radiation Oncology

## 2022-08-31 ENCOUNTER — Other Ambulatory Visit: Payer: Self-pay

## 2022-08-31 DIAGNOSIS — D492 Neoplasm of unspecified behavior of bone, soft tissue, and skin: Secondary | ICD-10-CM | POA: Diagnosis not present

## 2022-08-31 LAB — RAD ONC ARIA SESSION SUMMARY
Course Elapsed Days: 25
Plan Fractions Treated to Date: 20
Plan Prescribed Dose Per Fraction: 2 Gy
Plan Total Fractions Prescribed: 20
Plan Total Prescribed Dose: 40 Gy
Reference Point Dosage Given to Date: 40 Gy
Reference Point Session Dosage Given: 2 Gy
Session Number: 20

## 2022-09-11 ENCOUNTER — Other Ambulatory Visit: Payer: Self-pay | Admitting: Family Medicine

## 2022-09-18 ENCOUNTER — Other Ambulatory Visit (INDEPENDENT_AMBULATORY_CARE_PROVIDER_SITE_OTHER): Payer: Federal, State, Local not specified - PPO

## 2022-09-18 DIAGNOSIS — E7800 Pure hypercholesterolemia, unspecified: Secondary | ICD-10-CM

## 2022-09-18 DIAGNOSIS — I10 Essential (primary) hypertension: Secondary | ICD-10-CM | POA: Diagnosis not present

## 2022-09-18 LAB — LIPID PANEL
Cholesterol: 169 mg/dL (ref 0–200)
HDL: 62.3 mg/dL (ref >39.00)
LDL Cholesterol: 75 mg/dL (ref 0–99)
NonHDL: 106.42
Total CHOL/HDL Ratio: 3
Triglycerides: 158 mg/dL — ABNORMAL HIGH (ref 0.0–149.0)
VLDL: 31.6 mg/dL (ref 0.0–40.0)

## 2022-09-18 LAB — COMPREHENSIVE METABOLIC PANEL
ALT: 11 U/L (ref 0–35)
AST: 13 U/L (ref 0–37)
Albumin: 3.9 g/dL (ref 3.5–5.2)
Alkaline Phosphatase: 45 U/L (ref 39–117)
BUN: 19 mg/dL (ref 6–23)
CO2: 28 mEq/L (ref 19–32)
Calcium: 8.8 mg/dL (ref 8.4–10.5)
Chloride: 104 mEq/L (ref 96–112)
Creatinine, Ser: 0.72 mg/dL (ref 0.40–1.20)
GFR: 77.72 mL/min (ref >60.00)
Glucose, Bld: 93 mg/dL (ref 70–99)
Potassium: 3.5 mEq/L (ref 3.5–5.1)
Sodium: 140 mEq/L (ref 135–145)
Total Bilirubin: 0.5 mg/dL (ref 0.2–1.2)
Total Protein: 6.1 g/dL (ref 6.0–8.3)

## 2022-09-18 NOTE — Progress Notes (Signed)
No critical labs need to be addressed urgently. We will discuss labs in detail at upcoming office visit.   

## 2022-09-25 ENCOUNTER — Ambulatory Visit: Payer: Federal, State, Local not specified - PPO | Admitting: Family Medicine

## 2022-09-25 ENCOUNTER — Encounter: Payer: Self-pay | Admitting: Family Medicine

## 2022-09-25 VITALS — BP 130/70 | HR 71 | Temp 98.0°F | Ht 65.5 in | Wt 230.1 lb

## 2022-09-25 DIAGNOSIS — I10 Essential (primary) hypertension: Secondary | ICD-10-CM

## 2022-09-25 DIAGNOSIS — R7303 Prediabetes: Secondary | ICD-10-CM | POA: Diagnosis not present

## 2022-09-25 DIAGNOSIS — E538 Deficiency of other specified B group vitamins: Secondary | ICD-10-CM | POA: Diagnosis not present

## 2022-09-25 DIAGNOSIS — E7800 Pure hypercholesterolemia, unspecified: Secondary | ICD-10-CM

## 2022-09-25 DIAGNOSIS — R0609 Other forms of dyspnea: Secondary | ICD-10-CM | POA: Insufficient documentation

## 2022-09-25 DIAGNOSIS — R0602 Shortness of breath: Secondary | ICD-10-CM | POA: Insufficient documentation

## 2022-09-25 NOTE — Assessment & Plan Note (Addendum)
Chronic,  improved control  tolerating well. LDL  almost  at goal less than 70  better  on Crestor 10 mg daily.  Encouraged exercise, weight loss, healthy eating habits.

## 2022-09-25 NOTE — Progress Notes (Signed)
Patient ID: Kristine [name], female    DOB: Jan 13, 1941, 82 y.o.   MRN: [id number]  This visit was conducted in person.  BP 130/70   Pulse 71   Temp 98 F (36.7 C) (Temporal)   Ht 5' 5.5" (1.664 m)   Wt 230 lb 2 oz (104.4 kg)   SpO2 98%   BMI 37.71 kg/m    CC:  Chief Complaint  Patient presents with   Hypertension   Hyperlipidemia    Subjective:   HPI: Kristine [name] is a 82 y.o. female presenting on 09/25/2022 for Hypertension and Hyperlipidemia  Hypertension:   Improved control on  HCTZ 25 mg daily and valsartan160 mg daily BP Readings from Last 3 Encounters:  09/25/22 130/70  07/18/22 (!) 186/91  07/03/22 (!) 158/69  Using medication without problems or lightheadedness: none Chest pain with exertion: none Edema: occ when, improves by morning Short of breath: episode one day with walking up incline.. non on a rountine basis Average home BPs:  Other issues:  Prediabetes   fasting glucose 93 Lab Results  Component Value Date   HGBA1C 6.0 06/19/2022   Elevated Cholesterol:  Improved control and LDL almost at goal < 70 on  rosuvastatin 10 mg daily.  Eating less cheese. Lab Results  Component Value Date   CHOL 169 09/18/2022   HDL 62.30 09/18/2022   LDLCALC 75 09/18/2022   LDLDIRECT 105.0 06/14/2020   TRIG 158.0 (H) 09/18/2022   CHOLHDL 3 09/18/2022  Using medications without problems: none Muscle aches: none Diet compliance reviewed heart healthy diet exercise: walking Other complaints:   Relevant past medical, surgical, family and social history reviewed and updated as indicated. Interim medical history since our last visit reviewed. Allergies and medications reviewed and updated. Outpatient Medications Prior to Visit  Medication Sig Dispense Refill   ALPRAZolam (XANAX) 0.25 MG tablet Take 1-2 tablets (0.25-0.5 mg total) by mouth daily as needed (prior to imaging). 2 tablet 0   amoxicillin (AMOXIL) 500 MG capsule Take 500 mg by mouth See admin  instructions. Take 2000 mg by mouth 1 hour prior to dental procedures     Azelastine HCl 137 MCG/SPRAY SOLN Place into both nostrils.     colchicine 0.6 MG tablet Take 1 tablet (0.6 mg total) by mouth 2 (two) times daily as needed (for gout flare). 90 tablet 1   cyanocobalamin 2000 MCG tablet Take 2,000 mcg by mouth daily.     estrogens, conjugated, (PREMARIN) 0.625 MG tablet TAKE 1 TABLET EVERY EVENING 90 tablet 3   fexofenadine (ALLEGRA) 180 MG tablet Take 180 mg by mouth as needed for allergies or rhinitis.     fluticasone (FLONASE) 50 MCG/ACT nasal spray Place 2 sprays into both nostrils daily as needed for allergies.     folic acid (FOLVITE) 1 MG tablet Take 2 mg by mouth daily.     gabapentin (NEURONTIN) 600 MG tablet TAKE 1 TABLET 3 TIMES A    DAY. 270 tablet 1   hydrochlorothiazide (HYDRODIURIL) 25 MG tablet TAKE 1 TABLET (25 MG TOTAL) BY MOUTH DAILY. 90 tablet 3   levothyroxine (SYNTHROID) 75 MCG tablet TAKE 1 TABLET DAILY BEFORE BREAKFAST 90 tablet 3   methocarbamol (ROBAXIN) 500 MG tablet TAKE 1 TABLET BY MOUTH EVERY 6 HOURS AS NEEDED FOR MUSCLE SPASMS. 60 tablet 0   methotrexate (RHEUMATREX) 2.5 MG tablet Take 12.5 mg by mouth every Wednesday.     montelukast (SINGULAIR) 10 MG tablet Take 1 tablet (10 mg  total) by mouth at bedtime. 90 tablet 3   Neomy-Bacit-Polymyx-Pramoxine (TRIPLE ANTIBIOTIC PAIN RELIEF) 1 % OINT Apply 1 application  topically as needed (wound care).     Omega-3 Fatty Acids (FISH OIL) 1200 MG CAPS Take 2,400 mg by mouth in the morning, at noon, and at bedtime.     omeprazole (PRILOSEC) 20 MG capsule Take 20 mg by mouth daily.     polyethylene glycol (MIRALAX / GLYCOLAX) packet Take 17 g by mouth daily.     Probiotic Product (PROBIOTIC PO) Take 1 tablet by mouth daily.     rosuvastatin (CRESTOR) 10 MG tablet Take 1 tablet (10 mg total) by mouth daily. 90 tablet 3   solifenacin (VESICARE) 10 MG tablet TAKE 1 TABLET DAILY 90 tablet 3   valsartan (DIOVAN) 160 MG tablet  TAKE 1 TABLET BY MOUTH EVERY DAY 90 tablet 3   No facility-administered medications prior to visit.     Per HPI unless specifically indicated in ROS section below Review of Systems  Constitutional:  Negative for fatigue and fever.  HENT:  Negative for congestion.   Eyes:  Negative for pain.  Respiratory:  Negative for cough and shortness of breath.   Cardiovascular:  Negative for chest pain, palpitations and leg swelling.  Gastrointestinal:  Negative for abdominal pain.  Genitourinary:  Negative for dysuria and vaginal bleeding.  Musculoskeletal:  Negative for back pain.  Neurological:  Negative for syncope, light-headedness and headaches.  Psychiatric/Behavioral:  Negative for dysphoric mood.    Objective:  BP 130/70   Pulse 71   Temp 98 F (36.7 C) (Temporal)   Ht 5' 5.5" (1.664 m)   Wt 230 lb 2 oz (104.4 kg)   SpO2 98%   BMI 37.71 kg/m   Wt Readings from Last 3 Encounters:  09/25/22 230 lb 2 oz (104.4 kg)  07/18/22 227 lb (103 kg)  06/26/22 227 lb (103 kg)      Physical Exam Vitals and nursing note reviewed.  Constitutional:      General: She is not in acute distress.    Appearance: Normal appearance. She is well-developed. She is not ill-appearing or toxic-appearing.  HENT:     Head: Normocephalic.     Right Ear: Hearing, tympanic membrane, ear canal and external ear normal.     Left Ear: Hearing, tympanic membrane, ear canal and external ear normal.     Nose: Nose normal.  Eyes:     General: Lids are normal. Lids are everted, no foreign bodies appreciated.     Conjunctiva/sclera: Conjunctivae normal.     Pupils: Pupils are equal, round, and reactive to light.  Neck:     Thyroid: No thyroid mass or thyromegaly.     Vascular: No carotid bruit.     Trachea: Trachea normal.  Cardiovascular:     Rate and Rhythm: Normal rate and regular rhythm.     Heart sounds: Normal heart sounds, S1 normal and S2 normal. No murmur heard.    No gallop.  Pulmonary:     Effort:  Pulmonary effort is normal. No respiratory distress.     Breath sounds: Normal breath sounds. No wheezing, rhonchi or rales.  Abdominal:     General: Bowel sounds are normal. There is no distension or abdominal bruit.     Palpations: Abdomen is soft. There is no fluid wave or mass.     Tenderness: There is no abdominal tenderness. There is no guarding or rebound.     Hernia: No hernia is  present.  Musculoskeletal:     Cervical back: Normal range of motion and neck supple.  Lymphadenopathy:     Cervical: No cervical adenopathy.  Skin:    General: Skin is warm and dry.     Findings: No rash.  Neurological:     Mental Status: She is alert.     Cranial Nerves: No cranial nerve deficit.     Sensory: No sensory deficit.  Psychiatric:        Mood and Affect: Mood is not anxious or depressed.        Speech: Speech normal.        Behavior: Behavior normal. Behavior is cooperative.        Judgment: Judgment normal.       Results for orders placed or performed in visit on 09/18/22  Lipid panel  Result Value Ref Range   Cholesterol 169 0 - 200 mg/dL   Triglycerides 161.0 (H) 0.0 - 149.0 mg/dL   HDL 96.04 >54.09 mg/dL   VLDL 81.1 0.0 - 91.4 mg/dL   LDL Cholesterol 75 0 - 99 mg/dL   Total CHOL/HDL Ratio 3    NonHDL 106.42   Comprehensive metabolic panel  Result Value Ref Range   Sodium 140 135 - 145 mEq/L   Potassium 3.5 3.5 - 5.1 mEq/L   Chloride 104 96 - 112 mEq/L   CO2 28 19 - 32 mEq/L   Glucose, Bld 93 70 - 99 mg/dL   BUN 19 6 - 23 mg/dL   Creatinine, Ser 7.82 0.40 - 1.20 mg/dL   Total Bilirubin 0.5 0.2 - 1.2 mg/dL   Alkaline Phosphatase 45 39 - 117 U/L   AST 13 0 - 37 U/L   ALT 11 0 - 35 U/L   Total Protein 6.1 6.0 - 8.3 g/dL   Albumin 3.9 3.5 - 5.2 g/dL   GFR 95.62 >13.08 mL/min   Calcium 8.8 8.4 - 10.5 mg/dL    This visit occurred during the SARS-CoV-2 public health emergency.  Safety protocols were in place, including screening questions prior to the visit, additional  usage of staff PPE, and extensive cleaning of exam room while observing appropriate contact time as indicated for disinfecting solutions.   COVID 19 screen:  No recent travel or known exposure to COVID19 The patient denies respiratory symptoms of COVID 19 at this time. The importance of social distancing was discussed today.   Assessment and Plan   The patient's preventative maintenance and recommended screening tests for an annual wellness exam were reviewed in full today. Brought up to date unless services declined.  Counselled on the importance of diet, exercise, and its role in overall health and mortality. The patient's FH and SH was reviewed, including their home life, tobacco status, and drug and alcohol status.   Vaccines: uptodate  PNA, zoster. SE to flu in past refused Pap/DVE: Pap/DVE not indicated.   asymptomatic Mammo:06/3021 , plans to continue yearly. Scheduled 07/31/22 Bone Density:06/16/21 wrist nml.. Plan repeat in 5 years Colon: 04/2018 Dr. Lavon Paganini high grade tubular adenoma.. repeat  09/2021 no further indicated Smoking Status: none   Problem List Items Addressed This Visit     Essential hypertension, benign - Primary (Chronic)    Chronic, well controlled.    HCTZ 25 mg daily and valsartan160 mg daily      Prediabetes (Chronic)   Relevant Orders   Hemoglobin A1c   Pure hypercholesterolemia (Chronic)    Chronic,  improved control  tolerating well. LDL  almost  at goal less than 70  better  on Crestor 10 mg daily.  Encouraged exercise, weight loss, healthy eating habits.       Relevant Orders   Lipid panel   Comprehensive metabolic panel   SOB (shortness of breath) on exertion    Acute, intermittent  She reports several different incidents where she had temporary shortness of breath.  1 occurred associated with almost falling when walking on uneven ground outside without the cane.  A second 1 occurred when going up an incline on her driveway.  Finally 31  occurred after a long walk with her husband.  She has no continuous symptoms.  She denies chest pain.  She does have occasional swelling in her ankles but it resolves at bedtime.  Symptoms may be due to deconditioning, allergies.  If symptoms are persistent she will return for cardiovascular        Other Visit Diagnoses     B12 deficiency       Relevant Orders   Vitamin B12       Kerby Nora, MD

## 2022-09-25 NOTE — Assessment & Plan Note (Signed)
Chronic, well controlled.    HCTZ 25 mg daily and valsartan160 mg daily 

## 2022-09-25 NOTE — Assessment & Plan Note (Signed)
Acute, intermittent  She reports several different incidents where she had temporary shortness of breath.  1 occurred associated with almost falling when walking on uneven ground outside without the cane.  A second 1 occurred when going up an incline on her driveway.  Finally 31 occurred after a long walk with her husband.  She has no continuous symptoms.  She denies chest pain.  She does have occasional swelling in her ankles but it resolves at bedtime.  Symptoms may be due to deconditioning, allergies.  If symptoms are persistent she will return for cardiovascular

## 2022-10-04 ENCOUNTER — Ambulatory Visit
Admission: RE | Admit: 2022-10-04 | Discharge: 2022-10-04 | Disposition: A | Discharge status: Home / Self Care | Payer: Federal, State, Local not specified - PPO | Source: Ambulatory Visit | Attending: Radiation Oncology | Admitting: Radiation Oncology

## 2022-10-04 ENCOUNTER — Encounter: Payer: Self-pay | Admitting: Radiation Oncology

## 2022-10-04 DIAGNOSIS — D492 Neoplasm of unspecified behavior of bone, soft tissue, and skin: Secondary | ICD-10-CM | POA: Insufficient documentation

## 2022-10-04 DIAGNOSIS — Z510 Encounter for antineoplastic radiation therapy: Secondary | ICD-10-CM | POA: Insufficient documentation

## 2022-11-27 ENCOUNTER — Other Ambulatory Visit: Payer: Self-pay | Admitting: Family Medicine

## 2022-11-27 NOTE — Telephone Encounter (Signed)
Last office visit 09/25/22 for HTN and Hypercholesterolemia.  Last refilled 06/08/2022 for #270 with 1 refill.  Next Appt: 12/18/22 for 6 month follow up.

## 2022-12-11 ENCOUNTER — Other Ambulatory Visit (INDEPENDENT_AMBULATORY_CARE_PROVIDER_SITE_OTHER): Payer: Federal, State, Local not specified - PPO

## 2022-12-11 DIAGNOSIS — E7800 Pure hypercholesterolemia, unspecified: Secondary | ICD-10-CM

## 2022-12-11 DIAGNOSIS — R7303 Prediabetes: Secondary | ICD-10-CM | POA: Diagnosis not present

## 2022-12-11 DIAGNOSIS — E538 Deficiency of other specified B group vitamins: Secondary | ICD-10-CM

## 2022-12-11 LAB — COMPREHENSIVE METABOLIC PANEL
ALT: 12 U/L (ref 0–35)
AST: 13 U/L (ref 0–37)
Albumin: 4 g/dL (ref 3.5–5.2)
Alkaline Phosphatase: 45 U/L (ref 39–117)
BUN: 25 mg/dL — ABNORMAL HIGH (ref 6–23)
CO2: 27 mEq/L (ref 19–32)
Calcium: 9.3 mg/dL (ref 8.4–10.5)
Chloride: 106 mEq/L (ref 96–112)
Creatinine, Ser: 0.81 mg/dL (ref 0.40–1.20)
GFR: 67.37 mL/min (ref >60.00)
Glucose, Bld: 116 mg/dL — ABNORMAL HIGH (ref 70–99)
Potassium: 3.9 mEq/L (ref 3.5–5.1)
Sodium: 139 mEq/L (ref 135–145)
Total Bilirubin: 0.6 mg/dL (ref 0.2–1.2)
Total Protein: 6.3 g/dL (ref 6.0–8.3)

## 2022-12-11 LAB — HEMOGLOBIN A1C: Hgb A1c MFr Bld: 6.1 % (ref 4.6–6.5)

## 2022-12-11 LAB — LIPID PANEL
Cholesterol: 179 mg/dL (ref 0–200)
HDL: 63.5 mg/dL (ref >39.00)
LDL Cholesterol: 85 mg/dL (ref 0–99)
NonHDL: 115.78
Total CHOL/HDL Ratio: 3
Triglycerides: 152 mg/dL — ABNORMAL HIGH (ref 0.0–149.0)
VLDL: 30.4 mg/dL (ref 0.0–40.0)

## 2022-12-11 LAB — VITAMIN B12: Vitamin B-12: 842 pg/mL (ref 211–911)

## 2022-12-16 NOTE — Progress Notes (Signed)
No critical labs need to be addressed urgently. We will discuss labs in detail at upcoming office visit.   

## 2022-12-18 ENCOUNTER — Encounter: Payer: Self-pay | Admitting: Family Medicine

## 2022-12-18 ENCOUNTER — Ambulatory Visit: Payer: Federal, State, Local not specified - PPO | Admitting: Family Medicine

## 2022-12-18 VITALS — BP 120/60 | HR 82 | Temp 98.2°F | Ht 65.5 in | Wt 227.2 lb

## 2022-12-18 DIAGNOSIS — R0609 Other forms of dyspnea: Secondary | ICD-10-CM

## 2022-12-18 DIAGNOSIS — E7800 Pure hypercholesterolemia, unspecified: Secondary | ICD-10-CM

## 2022-12-18 DIAGNOSIS — I10 Essential (primary) hypertension: Secondary | ICD-10-CM

## 2022-12-18 DIAGNOSIS — R42 Dizziness and giddiness: Secondary | ICD-10-CM | POA: Diagnosis not present

## 2022-12-18 MED ORDER — ALPRAZOLAM 0.25 MG PO TABS: 0.2500 mg | ORAL_TABLET | Freq: Every day | ORAL | 0 refills | Status: DC | PRN

## 2022-12-18 NOTE — Assessment & Plan Note (Signed)
Chronic,  improved control  tolerating well. LDL  almost  at goal less than 70  better  on Crestor 10 mg daily.  Encouraged exercise, weight loss, healthy eating habits.

## 2022-12-18 NOTE — Patient Instructions (Addendum)
Call if SOB progressing.. we can move forward with cardiology referral for heart evaluation.  Set up appt with Dr. Patsy Lager for trigger point  evaluation/injection consider.

## 2022-12-18 NOTE — Progress Notes (Signed)
Patient ID: Kristine [name], female    DOB: [birth year], 82 y.o.   MRN: [id number]  This visit was conducted in person.  BP 120/60 (BP Location: Left Arm, Patient Position: Sitting, Cuff Size: Large)   Pulse 82   Temp 98.2 F (36.8 C) (Temporal)   Ht 5' 5.5" (1.664 m)   Wt 227 lb 4 oz (103.1 kg)   SpO2 97%   BMI 37.24 kg/m    CC:  Chief Complaint  Patient presents with   Medical Management of Chronic Issues    6 month follow up    Subjective:   HPI: Kristine [name] is a 82 y.o. female presenting on 12/18/2022 for Medical Management of Chronic Issues (6 month follow up)  She has noted in summer months she has been noting more  DOE in general when in heat. No CP.  In last week she has felt intermittently lightheaded.Marland Kitchen lasting for few seconds.  Not associated with position change.  No N/V/D  Not associated with DOE.  She has trid to reduce gabapentin. No other med changes  No bleeding.  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 In August.  Hypertension:   Improved control on  HCTZ 25 mg daily and valsartan160 mg daily BP Readings from Last 3 Encounters:  12/18/22 120/60  10/04/22 (!) 172/69  09/25/22 130/70  Using medication without problems or lightheadedness: none Chest pain with exertion: none Edema: occ when, improves by morning Short of breath: occ, worse in the heat Average home BPs:  Other issues:  Prediabetes    stable control Lab Results  Component Value Date   HGBA1C 6.1 12/11/2022   Elevated Cholesterol:  Good control and LDL almost at goal < 70 on  rosuvastatin 10 mg daily.  Eating less cheese. Lab Results  Component Value Date   CHOL 179 12/11/2022   HDL 63.50 12/11/2022   LDLCALC 85 12/11/2022   LDLDIRECT 105.0 06/14/2020   TRIG 152.0 (H) 12/11/2022   CHOLHDL 3 12/11/2022  Using medications without problems: none Muscle aches: none Diet compliance reviewed heart healthy diet exercise:  walking Other complaints:   Relevant past medical, surgical, family and social history reviewed and updated as indicated. Interim medical history since our last visit reviewed. Allergies and medications reviewed and updated. Outpatient Medications Prior to Visit  Medication Sig Dispense Refill   amoxicillin (AMOXIL) 500 MG capsule Take 500 mg by mouth See admin instructions. Take 2000 mg by mouth 1 hour prior to dental procedures     Azelastine HCl 137 MCG/SPRAY SOLN Place into both nostrils.     colchicine 0.6 MG tablet Take 1 tablet (0.6 mg total) by mouth 2 (two) times daily as needed (for gout flare). 90 tablet 1   cyanocobalamin 2000 MCG tablet Take 2,000 mcg by mouth daily.     estrogens, conjugated, (PREMARIN) 0.625 MG tablet TAKE 1 TABLET EVERY EVENING 90 tablet 3   fexofenadine (ALLEGRA) 180 MG tablet Take 180 mg by mouth as needed for allergies or rhinitis.     fluticasone (FLONASE) 50 MCG/ACT nasal spray Place 2 sprays into both nostrils daily as needed for allergies.     folic acid (FOLVITE) 1 MG tablet Take 2 mg by mouth daily.     gabapentin (NEURONTIN) 600 MG tablet TAKE 1 TABLET 3 TIMES A    DAY. 270 tablet 1   hydrochlorothiazide (HYDRODIURIL) 25 MG tablet TAKE 1 TABLET (25  MG TOTAL) BY MOUTH DAILY. 90 tablet 3   levothyroxine (SYNTHROID) 75 MCG tablet TAKE 1 TABLET DAILY BEFORE BREAKFAST 90 tablet 3   methocarbamol (ROBAXIN) 500 MG tablet TAKE 1 TABLET BY MOUTH EVERY 6 HOURS AS NEEDED FOR MUSCLE SPASMS. 60 tablet 0   methotrexate (RHEUMATREX) 2.5 MG tablet Take 12.5 mg by mouth every Wednesday.     montelukast (SINGULAIR) 10 MG tablet TAKE 1 TABLET AT BEDTIME 90 tablet 3   Neomy-Bacit-Polymyx-Pramoxine (TRIPLE ANTIBIOTIC PAIN RELIEF) 1 % OINT Apply 1 application  topically as needed (wound care).     Omega-3 Fatty Acids (FISH OIL) 1200 MG CAPS Take 2,400 mg by mouth in the morning, at noon, and at bedtime.     omeprazole (PRILOSEC) 20 MG capsule Take 20 mg by mouth daily.      polyethylene glycol (MIRALAX / GLYCOLAX) packet Take 17 g by mouth daily.     Probiotic Product (PROBIOTIC PO) Take 1 tablet by mouth daily.     rosuvastatin (CRESTOR) 10 MG tablet Take 1 tablet (10 mg total) by mouth daily. 90 tablet 3   solifenacin (VESICARE) 10 MG tablet TAKE 1 TABLET DAILY 90 tablet 3   valsartan (DIOVAN) 160 MG tablet TAKE 1 TABLET BY MOUTH EVERY DAY 90 tablet 3   ALPRAZolam (XANAX) 0.25 MG tablet Take 1-2 tablets (0.25-0.5 mg total) by mouth daily as needed (prior to imaging). 2 tablet 0   No facility-administered medications prior to visit.     Per HPI unless specifically indicated in ROS section below Review of Systems  Constitutional:  Negative for fatigue and fever.  HENT:  Negative for congestion.   Eyes:  Negative for pain.  Respiratory:  Negative for cough and shortness of breath.   Cardiovascular:  Negative for chest pain, palpitations and leg swelling.  Gastrointestinal:  Negative for abdominal pain.  Genitourinary:  Negative for dysuria and vaginal bleeding.  Musculoskeletal:  Negative for back pain.  Neurological:  Negative for syncope, light-headedness and headaches.  Psychiatric/Behavioral:  Negative for dysphoric mood.    Objective:  BP 120/60 (BP Location: Left Arm, Patient Position: Sitting, Cuff Size: Large)   Pulse 82   Temp 98.2 F (36.8 C) (Temporal)   Ht 5' 5.5" (1.664 m)   Wt 227 lb 4 oz (103.1 kg)   SpO2 97%   BMI 37.24 kg/m   Wt Readings from Last 3 Encounters:  12/18/22 227 lb 4 oz (103.1 kg)  10/04/22 228 lb 14.4 oz (103.8 kg)  09/25/22 230 lb 2 oz (104.4 kg)      Physical Exam Vitals and nursing note reviewed.  Constitutional:      General: She is not in acute distress.    Appearance: Normal appearance. She is well-developed. She is not ill-appearing or toxic-appearing.  HENT:     Head: Normocephalic.     Right Ear: Hearing, tympanic membrane, ear canal and external ear normal.     Left Ear: Hearing, tympanic  membrane, ear canal and external ear normal.     Nose: Nose normal.  Eyes:     General: Lids are normal. Lids are everted, no foreign bodies appreciated.     Conjunctiva/sclera: Conjunctivae normal.     Pupils: Pupils are equal, round, and reactive to light.  Neck:     Thyroid: No thyroid mass or thyromegaly.     Vascular: No carotid bruit.     Trachea: Trachea normal.  Cardiovascular:     Rate and Rhythm: Normal rate and regular  rhythm.     Heart sounds: Normal heart sounds, S1 normal and S2 normal. No murmur heard.    No gallop.  Pulmonary:     Effort: Pulmonary effort is normal. No respiratory distress.     Breath sounds: Normal breath sounds. No wheezing, rhonchi or rales.  Abdominal:     General: Bowel sounds are normal. There is no distension or abdominal bruit.     Palpations: Abdomen is soft. There is no fluid wave or mass.     Tenderness: There is no abdominal tenderness. There is no guarding or rebound.     Hernia: No hernia is present.  Musculoskeletal:     Cervical back: Normal range of motion and neck supple.  Lymphadenopathy:     Cervical: No cervical adenopathy.  Skin:    General: Skin is warm and dry.     Findings: No rash.  Neurological:     Mental Status: She is alert.     Cranial Nerves: No cranial nerve deficit.     Sensory: No sensory deficit.  Psychiatric:        Mood and Affect: Mood is not anxious or depressed.        Speech: Speech normal.        Behavior: Behavior normal. Behavior is cooperative.        Judgment: Judgment normal.       Results for orders placed or performed in visit on 12/11/22  Hemoglobin A1c  Result Value Ref Range   Hgb A1c MFr Bld 6.1 4.6 - 6.5 %  Vitamin B12  Result Value Ref Range   Vitamin B-12 842 211 - 911 pg/mL  Comprehensive metabolic panel  Result Value Ref Range   Sodium 139 135 - 145 mEq/L   Potassium 3.9 3.5 - 5.1 mEq/L   Chloride 106 96 - 112 mEq/L   CO2 27 19 - 32 mEq/L   Glucose, Bld 116 (H) 70 - 99  mg/dL   BUN 25 (H) 6 - 23 mg/dL   Creatinine, Ser 1.61 0.40 - 1.20 mg/dL   Total Bilirubin 0.6 0.2 - 1.2 mg/dL   Alkaline Phosphatase 45 39 - 117 U/L   AST 13 0 - 37 U/L   ALT 12 0 - 35 U/L   Total Protein 6.3 6.0 - 8.3 g/dL   Albumin 4.0 3.5 - 5.2 g/dL   GFR 09.60 >45.40 mL/min   Calcium 9.3 8.4 - 10.5 mg/dL  Lipid panel  Result Value Ref Range   Cholesterol 179 0 - 200 mg/dL   Triglycerides 981.1 (H) 0.0 - 149.0 mg/dL   HDL 91.47 >82.95 mg/dL   VLDL 62.1 0.0 - 30.8 mg/dL   LDL Cholesterol 85 0 - 99 mg/dL   Total CHOL/HDL Ratio 3    NonHDL 115.78     This visit occurred during the SARS-CoV-2 public health emergency.  Safety protocols were in place, including screening questions prior to the visit, additional usage of staff PPE, and extensive cleaning of exam room while observing appropriate contact time as indicated for disinfecting solutions.   COVID 19 screen:  No recent travel or known exposure to COVID19 The patient denies respiratory symptoms of COVID 19 at this time. The importance of social distancing was discussed today.   Assessment and Plan  Problem List Items Addressed This Visit     DOE (dyspnea on exertion)    Acute, intermittent issue.  Patient associates this mainly with trying to do physical  activity in the heat. No  clear evidence of medication side effect, anemia or blood loss.  Recent lab testing within the normal range Given the persistence of this intermittent issue as well as her age and other history we did discuss possible referral to cardiology for stress testing.  She will consider this especially if symptoms continue.      Essential hypertension, benign - Primary (Chronic)    Chronic, well controlled.    HCTZ 25 mg daily and valsartan160 mg daily      Lightheadedness    Acute, episodic.  Not clearly associated with low blood pressure, position or low blood sugar.  She denies any blood loss. She has upcoming MRI to evaluate schwannoma. Not  clearly associated with her shortness of breath. Consider further evaluation if persistent      Pure hypercholesterolemia (Chronic)    Chronic,  improved control  tolerating well. LDL  almost  at goal less than 70  better  on Crestor 10 mg daily.  Encouraged exercise, weight loss, healthy eating habits.         Kerby Nora, MD

## 2022-12-18 NOTE — Assessment & Plan Note (Signed)
Chronic, well controlled.    HCTZ 25 mg daily and valsartan160 mg daily

## 2022-12-18 NOTE — Assessment & Plan Note (Signed)
Acute, episodic.  Not clearly associated with low blood pressure, position or low blood sugar.  She denies any blood loss. She has upcoming MRI to evaluate schwannoma. Not clearly associated with her shortness of breath. Consider further evaluation if persistent

## 2022-12-18 NOTE — Assessment & Plan Note (Signed)
Acute, intermittent issue.  Patient associates this mainly with trying to do physical  activity in the heat. No clear evidence of medication side effect, anemia or blood loss.  Recent lab testing within the normal range Given the persistence of this intermittent issue as well as her age and other history we did discuss possible referral to cardiology for stress testing.  She will consider this especially if symptoms continue.

## 2022-12-30 NOTE — Progress Notes (Signed)
     T. , MD, CAQ Sports Medicine [facility] at [facility] [address] [place], [address]  Phone: [phone]  FAX: [phone]  Kristine [name] - 82 y.o. female  MRN [id number]  Date of Birth: 08-09-1940  Date: 12/31/2022  PCP: Excell Seltzer, MD  Referral: Excell Seltzer, MD  No chief complaint on file.  Subjective:   Kristine [name] is a 82 y.o. very pleasant female patient with There is no height or weight on file to calculate BMI. who presents with the following:  Patient presents with evaluation for trigger thumb.     Review of Systems is noted in the HPI, as appropriate  Objective:   There were no vitals taken for this visit.  GEN: No acute distress; alert,appropriate. PULM: Breathing comfortably in no respiratory distress PSYCH: Normally interactive.   Laboratory and Imaging Data:  Assessment and Plan:   ***

## 2022-12-31 ENCOUNTER — Encounter: Payer: Self-pay | Admitting: Family Medicine

## 2022-12-31 ENCOUNTER — Ambulatory Visit: Payer: Federal, State, Local not specified - PPO | Admitting: Family Medicine

## 2022-12-31 VITALS — BP 140/60 | HR 76 | Temp 97.8°F | Ht 65.5 in | Wt 228.4 lb

## 2022-12-31 DIAGNOSIS — M65311 Trigger thumb, right thumb: Secondary | ICD-10-CM

## 2022-12-31 DIAGNOSIS — M7050 Other bursitis of knee, unspecified knee: Secondary | ICD-10-CM | POA: Diagnosis not present

## 2022-12-31 DIAGNOSIS — M069 Rheumatoid arthritis, unspecified: Secondary | ICD-10-CM | POA: Diagnosis not present

## 2022-12-31 DIAGNOSIS — M170 Bilateral primary osteoarthritis of knee: Secondary | ICD-10-CM

## 2022-12-31 DIAGNOSIS — S86111A Strain of other muscle(s) and tendon(s) of posterior muscle group at lower leg level, right leg, initial encounter: Secondary | ICD-10-CM

## 2022-12-31 MED ORDER — TRIAMCINOLONE ACETONIDE 40 MG/ML IJ SUSP
20.0000 mg | Freq: Once | INTRAMUSCULAR | Status: AC
  Administered 2022-12-31: 20 mg via INTRA_ARTICULAR

## 2022-12-31 NOTE — Patient Instructions (Signed)
Voltaren 1% gel, over the counter You can apply up to 4 times a day  This can be applied to any joint: knee, wrist, fingers, elbows, shoulders, feet and ankles. Can apply to any tendon: tennis elbow, achilles, tendon, rotator cuff or any other tendon.  Minimal is absorbed in the bloodstream: ok with oral anti-inflammatory or a blood thinner.  Cost is about 9 dollars    Treadmill: Calf and posterior lower extremity rehab  Forward Walking: Go light 2 mins, easy no more than 2-3 mph:  Sideways Left: 2 mins, 0.6 - 0.8 mph Sideways Right: 2 mins, 0.6 - 0.8 mph Backwards, 2 mins, 1.8 - 2.2 mph Repeat, several cycles Goal is 30 minute  Start at a 3 degree incline - can slightly lower if necessary When able, increase to 3.5, then 4, then 4.5 (After you comfortably can do 30 minutes)

## 2023-01-01 ENCOUNTER — Ambulatory Visit
Admission: RE | Admit: 2023-01-01 | Discharge: 2023-01-01 | Disposition: A | Discharge status: Home / Self Care | Payer: Federal, State, Local not specified - PPO | Source: Ambulatory Visit | Attending: Otolaryngology | Admitting: Otolaryngology

## 2023-01-01 DIAGNOSIS — D333 Benign neoplasm of cranial nerves: Secondary | ICD-10-CM

## 2023-01-01 MED ORDER — GADOPICLENOL 0.5 MMOL/ML IV SOLN
10.0000 mL | Freq: Once | INTRAVENOUS | Status: AC | PRN
  Administered 2023-01-01: 10 mL via INTRAVENOUS

## 2023-02-02 ENCOUNTER — Other Ambulatory Visit: Payer: Self-pay | Admitting: Family Medicine

## 2023-02-07 ENCOUNTER — Ambulatory Visit
Admission: RE | Admit: 2023-02-07 | Discharge: 2023-02-07 | Disposition: A | Discharge status: Home / Self Care | Payer: Federal, State, Local not specified - PPO | Source: Ambulatory Visit | Attending: Radiation Oncology | Admitting: Radiation Oncology

## 2023-02-07 ENCOUNTER — Encounter: Payer: Self-pay | Admitting: Radiation Oncology

## 2023-02-07 VITALS — BP 148/68 | HR 75 | Temp 97.1°F | Resp 16 | Wt 227.0 lb

## 2023-02-07 DIAGNOSIS — Z923 Personal history of irradiation: Secondary | ICD-10-CM | POA: Diagnosis not present

## 2023-02-07 DIAGNOSIS — L986 Other infiltrative disorders of the skin and subcutaneous tissue: Secondary | ICD-10-CM | POA: Diagnosis present

## 2023-02-07 DIAGNOSIS — D492 Neoplasm of unspecified behavior of bone, soft tissue, and skin: Secondary | ICD-10-CM

## 2023-02-07 NOTE — Progress Notes (Signed)
Radiation Oncology Follow up Note  Name: Kristine [name]   Date:   02/07/2023 MRN:  [id number] DOB: [name]    This 82 y.o. 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  Seen today in follow-up she is doing well specifically denies any symptoms.  The mass is completely responded with only a slight pink hue in that area..  She is followed by ENT for a schwannoma  COMPLICATIONS OF TREATMENT: none  FOLLOW UP COMPLIANCE: keeps appointments   PHYSICAL EXAM:  BP (!) 148/68 (BP Location: Left Arm, Patient Position: Sitting)   Pulse 75   Temp (!) 97.1 F (36.2 C) (Tympanic)   Resp 16   Wt 227 lb (103 kg)   BMI 37.20 kg/m  Area of the left flank again shows only a slight pink hue no evidence of nodularity or masses noted.  No peripheral adenopathy is identified.  Well-developed well-nourished patient in NAD. HEENT reveals PERLA, EOMI, discs not visualized.  Oral cavity is clear. No oral mucosal lesions are identified. Neck is clear without evidence of cervical or supraclavicular adenopathy. Lungs are clear to A&P. Cardiac examination is essentially unremarkable with regular rate and rhythm without murmur rub or thrill. Abdomen is benign with no organomegaly or masses noted. Motor sensory and DTR levels are equal and symmetric in the upper and lower extremities. Cranial nerves II through XII are grossly intact. Proprioception is intact. No peripheral adenopathy or edema is identified. No motor or sensory levels are noted. Crude visual fields are within normal range.  RADIOLOGY RESULTS: No current films for review  PLAN: Present time patient  has no evidence of disease.  I am going to turn follow-up care after over to her other caregivers and to including dermatology.  Be happy to reevaluate the patient in time should that be indicated.  I would like to take this opportunity to thank you for allowing me to participate in the care of your patient.Kristine Miller, MD

## 2023-02-12 ENCOUNTER — Ambulatory Visit: Payer: Federal, State, Local not specified - PPO | Admitting: Dermatology

## 2023-02-20 ENCOUNTER — Ambulatory Visit: Payer: Federal, State, Local not specified - PPO | Admitting: Dermatology

## 2023-02-20 VITALS — BP 148/80 | HR 75

## 2023-02-20 DIAGNOSIS — D239 Other benign neoplasm of skin, unspecified: Secondary | ICD-10-CM

## 2023-02-20 DIAGNOSIS — L814 Other melanin hyperpigmentation: Secondary | ICD-10-CM

## 2023-02-20 DIAGNOSIS — Z8572 Personal history of non-Hodgkin lymphomas: Secondary | ICD-10-CM

## 2023-02-20 DIAGNOSIS — L821 Other seborrheic keratosis: Secondary | ICD-10-CM

## 2023-02-20 DIAGNOSIS — L820 Inflamed seborrheic keratosis: Secondary | ICD-10-CM | POA: Diagnosis not present

## 2023-02-20 DIAGNOSIS — Z1283 Encounter for screening for malignant neoplasm of skin: Secondary | ICD-10-CM | POA: Diagnosis not present

## 2023-02-20 DIAGNOSIS — R202 Paresthesia of skin: Secondary | ICD-10-CM

## 2023-02-20 DIAGNOSIS — D492 Neoplasm of unspecified behavior of bone, soft tissue, and skin: Secondary | ICD-10-CM | POA: Diagnosis not present

## 2023-02-20 DIAGNOSIS — L578 Other skin changes due to chronic exposure to nonionizing radiation: Secondary | ICD-10-CM | POA: Diagnosis not present

## 2023-02-20 DIAGNOSIS — D2339 Other benign neoplasm of skin of other parts of face: Secondary | ICD-10-CM

## 2023-02-20 DIAGNOSIS — D225 Melanocytic nevi of trunk: Secondary | ICD-10-CM

## 2023-02-20 DIAGNOSIS — L853 Xerosis cutis: Secondary | ICD-10-CM

## 2023-02-20 DIAGNOSIS — D229 Melanocytic nevi, unspecified: Secondary | ICD-10-CM

## 2023-02-20 DIAGNOSIS — W908XXA Exposure to other nonionizing radiation, initial encounter: Secondary | ICD-10-CM

## 2023-02-20 DIAGNOSIS — D485 Neoplasm of uncertain behavior of skin: Secondary | ICD-10-CM

## 2023-02-20 NOTE — Patient Instructions (Addendum)
Cryotherapy Aftercare  Wash gently with soap and water everyday.   Apply Vaseline and Band-Aid daily until healed.   Seborrheic Keratosis  What causes seborrheic keratoses? Seborrheic keratoses are harmless, common skin growths that first appear during adult life.  As time goes by, more growths appear.  Some people may develop a large number of them.  Seborrheic keratoses appear on both covered and uncovered body parts.  They are not caused by sunlight.  The tendency to develop seborrheic keratoses can be inherited.  They vary in color from skin-colored to gray, brown, or even black.  They can be either smooth or have a rough, warty surface.   Seborrheic keratoses are superficial and look as if they were stuck on the skin.  Under the microscope this type of keratosis looks like layers upon layers of skin.  That is why at times the top layer may seem to fall off, but the rest of the growth remains and re-grows.    Treatment Seborrheic keratoses do not need to be treated, but can easily be removed in the office.  Seborrheic keratoses often cause symptoms when they rub on clothing or jewelry.  Lesions can be in the way of shaving.  If they become inflamed, they can cause itching, soreness, or burning.  Removal of a seborrheic keratosis can be accomplished by freezing, burning, or surgery. If any spot bleeds, scabs, or grows rapidly, please return to have it checked, as these can be an indication of a skin cancer.  Gentle Skin Care Guide  1. Bathe no more than once a day.  2. Avoid bathing in hot water  3. Use a mild soap like Dove, Vanicream, Cetaphil, CeraVe. Can use Lever 2000 or Cetaphil antibacterial soap  4. Use soap only where you need it. On most days, use it under your arms, between your legs, and on your feet. Let the water rinse other areas unless visibly dirty.  5. When you get out of the bath/shower, use a towel to gently blot your skin dry, don't rub it.  6. While your skin is  still a little damp, apply a moisturizing cream such as Vanicream, CeraVe, Cetaphil, Eucerin, Sarna lotion or plain Vaseline Jelly. For hands apply Neutrogena [place] Hand Cream or Excipial Hand Cream.  7. Reapply moisturizer any time you start to itch or feel dry.  8. Sometimes using free and clear laundry detergents can be helpful. Fabric softener sheets should be avoided. Downy Free & Gentle liquid, or any liquid fabric softener that is free of dyes and perfumes, it acceptable to use  9. If your doctor has given you prescription creams you may apply moisturizers over them     Due to recent changes in healthcare laws, you may see results of your pathology and/or laboratory studies on MyChart before the doctors have had a chance to review them. We understand that in some cases there may be results that are confusing or concerning to you. Please understand that not all results are received at the same time and often the doctors may need to interpret multiple results in order to provide you with the best plan of care or course of treatment. Therefore, we ask that you please give [place] 2 business days to thoroughly review all your results before contacting the office for clarification. Should we see a critical lab result, you will be contacted sooner.   If You Need Anything After Your Visit  If you have any questions or concerns for your doctor,  please call our main line at [phone] and press option 4 to reach your doctor's medical assistant. If no one answers, please leave a voicemail as directed and we will return your call as soon as possible. Messages left after 4 pm will be answered the following business day.   You may also send [place] a message via MyChart. We typically respond to MyChart messages within 1-2 business days.  For prescription refills, please ask your pharmacy to contact our office. Our fax number is [phone].  If you have an urgent issue when the clinic is closed that  cannot wait until the next business day, you can page your doctor at the number below.    Please note that while we do our best to be available for urgent issues outside of office hours, we are not available 24/7.   If you have an urgent issue and are unable to reach [place], you may choose to seek medical care at your doctor's office, retail clinic, urgent care center, or emergency room.  If you have a medical emergency, please immediately call 911 or go to the emergency department.  Pager Numbers  - Dr. Gwen Pounds: [phone]  - Dr. Roseanne Reno: [phone]  - Dr. Katrinka Blazing: [phone]   In the event of inclement weather, please call our main line at [phone] for an update on the status of any delays or closures.  Dermatology Medication Tips: Please keep the boxes that topical medications come in in order to help keep track of the instructions about where and how to use these. Pharmacies typically print the medication instructions only on the boxes and not directly on the medication tubes.   If your medication is too expensive, please contact our office at [phone] option 4 or send [place] a message through MyChart.   We are unable to tell what your co-pay for medications will be in advance as this is different depending on your insurance coverage. However, we may be able to find a substitute medication at lower cost or fill out paperwork to get insurance to cover a needed medication.   If a prior authorization is required to get your medication covered by your insurance company, please allow [place] 1-2 business days to complete this process.  Drug prices often vary depending on where the prescription is filled and some pharmacies may offer cheaper prices.  The website www.goodrx.com contains coupons for medications through different pharmacies. The prices here do not account for what the cost may be with help from insurance (it may be cheaper with your insurance), but the website can give you  the price if you did not use any insurance.  - You can print the associated coupon and take it with your prescription to the pharmacy.  - You may also stop by our office during regular business hours and pick up a GoodRx coupon card.  - If you need your prescription sent electronically to a different pharmacy, notify our office through [facility] or by phone at [phone] option 4.     Si Usted Necesita Algo Despus de Su Visita  Tambin puede enviarnos un mensaje a travs de Clinical cytogeneticist. Por lo general respondemos a los mensajes de MyChart en el transcurso de 1 a 2 das hbiles.  Para renovar recetas, por favor pida a su farmacia que se ponga en contacto con nuestra oficina. Annie Sable de fax es [place] [phone].  Si tiene un asunto urgente cuando la clnica est cerrada y que no puede esperar hasta el siguiente  da hbil, puede llamar/localizar a su doctor(a) al nmero que aparece a continuacin.   Por favor, tenga en cuenta que aunque hacemos todo lo posible para estar disponibles para asuntos urgentes fuera del horario de [place], no estamos disponibles las 24 horas del da, los 7 [address]  [address] de la [place].   Si tiene un problema urgente y no puede comunicarse con nosotros, puede optar por buscar atencin mdica  en el consultorio de su doctor(a), en una clnica privada, en un centro de atencin urgente o en una sala de emergencias.  Si tiene Engineer, drilling, por favor llame inmediatamente al 911 o vaya a la sala de emergencias.  Nmeros de bper  - Dr. Gwen Pounds: [phone]  - Dra. Roseanne Reno: [phone]  - Dr. Katrinka Blazing: [phone]   En caso de inclemencias del tiempo, por favor llame a Lacy Duverney principal al [phone] para una actualizacin sobre el [place] de cualquier retraso o cierre.  Consejos para la medicacin en dermatologa: Por favor, guarde las cajas en las que vienen los medicamentos de uso tpico para ayudarle a seguir las instrucciones sobre dnde y  cmo usarlos. Las farmacias generalmente imprimen las instrucciones del medicamento slo en las cajas y no directamente en los tubos del [place].   Si su medicamento es muy caro, por favor, pngase en contacto con Rolm [place]ala llamando al [phone] y presione la opcin 4 o envenos un mensaje a travs de Clinical cytogeneticist.   No podemos decirle cul ser su copago por los medicamentos por adelantado ya que esto es diferente dependiendo de la cobertura de su seguro. Sin embargo, es posible que podamos encontrar un medicamento sustituto a Audiological scientist un formulario para que el seguro cubra el medicamento que se considera necesario.   Si se requiere una autorizacin previa para que su compaa de seguros [place] su medicamento, por favor permtanos de 1 a 2 das hbiles para completar [address]  Los precios de los medicamentos varan con frecuencia dependiendo del Environmental consultant de dnde se surte la receta y alguna farmacias pueden ofrecer precios ms baratos.  El sitio web www.goodrx.com tiene cupones para medicamentos de Health and safety inspector. Los precios aqu no tienen en cuenta lo que podra costar con la ayuda del seguro (puede ser ms barato con su seguro), pero el sitio web puede darle el precio si no utiliz Tourist information centre manager.  - Puede imprimir el cupn correspondiente y llevarlo con su receta a la farmacia.  - Tambin puede pasar por nuestra oficina durante el horario de atencin regular y Education officer, museum una tarjeta de cupones de GoodRx.  - Si necesita que su receta se enve electrnicamente a una farmacia diferente, informe a nuestra oficina a travs de MyChart de [place] o por telfono llamando al [phone] y presione la opcin 4. ------------

## 2023-02-20 NOTE — Progress Notes (Signed)
Follow-Up Visit   Subjective  Kristine [name] is a 82 y.o. female who presents for the following: Skin Cancer Screening and Full Body Skin Exam  The patient presents for Total-Body Skin Exam (TBSE) for skin cancer screening and mole check. The patient has spots, moles and lesions to be evaluated, some may be new or changing and the patient may have concern these could be cancer. Patient has a history of ATYPICAL NODULAR B-CELL INFILTRATE, L post flank, Radiation w/ Dr. Rushie Chestnut (finished 20 day radiation in April). Pet scan was negative She has an itchy spot by her nose and also itchy area in scalp- pt states came up after a surgery she had a few years ago.    The following portions of the chart were reviewed this encounter and updated as appropriate: medications, allergies, medical history  Review of Systems:  No other skin or systemic complaints except as noted in HPI or Assessment and Plan.  Objective  Well appearing patient in no apparent distress; mood and affect are within normal limits.  A full examination was performed including scalp, head, eyes, ears, nose, lips, neck, chest, axillae, abdomen, back, buttocks, bilateral upper extremities, bilateral lower extremities, hands, feet, fingers, toes, fingernails, and toenails. All findings within normal limits unless otherwise noted below.   Relevant physical exam findings are noted in the Assessment and Plan.  Left posterior flank Clear;  no lymphadenopathy.  Right posterior crown Right posterior crown scalp with itching, but no obvious rash.  Right Medial Canthus/Paranasal Erythematous stuck-on, waxy papule     Assessment & Plan   SKIN CANCER SCREENING PERFORMED TODAY.  ACTINIC DAMAGE - Chronic condition, secondary to cumulative UV/sun exposure - diffuse scaly erythematous macules with underlying dyspigmentation - Recommend daily broad spectrum sunscreen SPF 30+ to sun-exposed areas, reapply every 2 hours as needed.   - Staying in the shade or wearing long sleeves, sun glasses (UVA+UVB protection) and wide brim hats (4-inch brim around the entire circumference of the hat) are also recommended for sun protection.  - Call for new or changing lesions.  LENTIGINES, SEBORRHEIC KERATOSES, HEMANGIOMAS - Benign normal skin lesions - Benign-appearing - Call for any changes  MELANOCYTIC NEVI - Tan-brown and/or pink-flesh-colored symmetric macules and papules - 3.0 mm pink flesh papule at left anterior axilla - Benign appearing on exam today - Observation - Call clinic for new or changing moles - Recommend daily use of broad spectrum spf 30+ sunscreen to sun-exposed areas.   Syringoma vs Sebaceous Hyperplasia - Small yellow papules BL infraocular - Benign-appearing - Observe. Call for changes.  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 Unknown origin. PET negative Patient completed 20 day radiation with Dr Rushie Chestnut in April 2024 Clear. Observe for recurrence. Call clinic for new or changing lesions.  Recommend regular skin exams, daily broad-spectrum spf 30+ sunscreen use, and photoprotection.     Paresthesia of skin Right posterior crown  Benign. Observation.   Inflamed seborrheic keratosis Right Medial Canthus/Paranasal  Symptomatic, irritating, patient would like treated.  Destruction of lesion - Right Medial Canthus/Paranasal  Destruction method: cryotherapy   Informed consent: discussed and consent obtained   Lesion destroyed using liquid nitrogen: Yes   Region frozen until ice ball extended beyond lesion: Yes   Outcome: patient tolerated procedure well with no complications   Post-procedure details:  wound care instructions given   Additional details:  Prior to procedure, discussed risks of  blister formation, small wound, skin dyspigmentation, or rare scar following cryotherapy. Recommend Vaseline ointment to treated areas while healing.   Skin cancer screening  Actinic skin damage  Xerosis cutis  Syringoma  Nevus  Lentigo  Seborrheic keratosis   Return in about 1 year (around 02/20/2024) for TBSE.  ICherlyn Labella, CMA, am acting as scribe for Willeen Niece, MD .   Documentation: I have reviewed the above documentation for accuracy and completeness, and I agree with the above.  Willeen Niece, MD

## 2023-05-11 ENCOUNTER — Other Ambulatory Visit: Payer: Self-pay | Admitting: Family Medicine

## 2023-05-29 ENCOUNTER — Other Ambulatory Visit: Payer: Self-pay | Admitting: Family Medicine

## 2023-06-11 ENCOUNTER — Telehealth: Payer: Self-pay | Admitting: *Deleted

## 2023-06-11 DIAGNOSIS — E039 Hypothyroidism, unspecified: Secondary | ICD-10-CM

## 2023-06-11 DIAGNOSIS — D509 Iron deficiency anemia, unspecified: Secondary | ICD-10-CM

## 2023-06-11 DIAGNOSIS — E7800 Pure hypercholesterolemia, unspecified: Secondary | ICD-10-CM

## 2023-06-11 DIAGNOSIS — R7303 Prediabetes: Secondary | ICD-10-CM

## 2023-06-11 NOTE — Telephone Encounter (Signed)
-----   Message from Vincenza Hews sent at 06/11/2023  1:48 PM EST ----- Regarding: Lab Fri 06/21/23 Hello,  Patient is coming in for CPE labs on Friday 06/21/23. Can we get orders please.   Thanks

## 2023-06-21 ENCOUNTER — Other Ambulatory Visit (INDEPENDENT_AMBULATORY_CARE_PROVIDER_SITE_OTHER): Payer: Federal, State, Local not specified - PPO

## 2023-06-21 ENCOUNTER — Encounter: Payer: Self-pay | Admitting: Family Medicine

## 2023-06-21 DIAGNOSIS — D509 Iron deficiency anemia, unspecified: Secondary | ICD-10-CM | POA: Diagnosis not present

## 2023-06-21 DIAGNOSIS — R7303 Prediabetes: Secondary | ICD-10-CM

## 2023-06-21 DIAGNOSIS — E7800 Pure hypercholesterolemia, unspecified: Secondary | ICD-10-CM | POA: Diagnosis not present

## 2023-06-21 DIAGNOSIS — E039 Hypothyroidism, unspecified: Secondary | ICD-10-CM

## 2023-06-21 LAB — COMPREHENSIVE METABOLIC PANEL
ALT: 10 U/L (ref 0–35)
AST: 11 U/L (ref 0–37)
Albumin: 4 g/dL (ref 3.5–5.2)
Alkaline Phosphatase: 48 U/L (ref 39–117)
BUN: 20 mg/dL (ref 6–23)
CO2: 28 meq/L (ref 19–32)
Calcium: 9.1 mg/dL (ref 8.4–10.5)
Chloride: 103 meq/L (ref 96–112)
Creatinine, Ser: 0.73 mg/dL (ref 0.40–1.20)
GFR: 76.04 mL/min (ref >60.00)
Glucose, Bld: 120 mg/dL — ABNORMAL HIGH (ref 70–99)
Potassium: 4.1 meq/L (ref 3.5–5.1)
Sodium: 140 meq/L (ref 135–145)
Total Bilirubin: 0.7 mg/dL (ref 0.2–1.2)
Total Protein: 5.8 g/dL — ABNORMAL LOW (ref 6.0–8.3)

## 2023-06-21 LAB — LIPID PANEL
Cholesterol: 164 mg/dL (ref 0–200)
HDL: 57.4 mg/dL (ref >39.00)
LDL Cholesterol: 79 mg/dL (ref 0–99)
NonHDL: 106.17
Total CHOL/HDL Ratio: 3
Triglycerides: 137 mg/dL (ref 0.0–149.0)
VLDL: 27.4 mg/dL (ref 0.0–40.0)

## 2023-06-21 LAB — TSH: TSH: 4.31 u[IU]/mL (ref 0.35–5.50)

## 2023-06-21 LAB — CBC WITH DIFFERENTIAL/PLATELET
Basophils Absolute: 0 10*3/uL (ref 0.0–0.1)
Basophils Relative: 0.8 % (ref 0.0–3.0)
Eosinophils Absolute: 0.2 10*3/uL (ref 0.0–0.7)
Eosinophils Relative: 3.5 % (ref 0.0–5.0)
HCT: 38.6 % (ref 36.0–46.0)
Hemoglobin: 12.9 g/dL (ref 12.0–15.0)
Lymphocytes Relative: 34.7 % (ref 12.0–46.0)
Lymphs Abs: 2.1 10*3/uL (ref 0.7–4.0)
MCHC: 33.4 g/dL (ref 30.0–36.0)
MCV: 88.7 fL (ref 78.0–100.0)
Monocytes Absolute: 0.4 10*3/uL (ref 0.1–1.0)
Monocytes Relative: 7.5 % (ref 3.0–12.0)
Neutro Abs: 3.2 10*3/uL (ref 1.4–7.7)
Neutrophils Relative %: 53.5 % (ref 43.0–77.0)
Platelets: 199 10*3/uL (ref 150.0–400.0)
RBC: 4.35 Mil/uL (ref 3.87–5.11)
RDW: 13.7 % (ref 11.5–15.5)
WBC: 6 10*3/uL (ref 4.0–10.5)

## 2023-06-21 LAB — HEMOGLOBIN A1C: Hgb A1c MFr Bld: 6.4 % (ref 4.6–6.5)

## 2023-06-21 LAB — T4, FREE: Free T4: 1.02 ng/dL (ref 0.60–1.60)

## 2023-06-21 LAB — T3, FREE: T3, Free: 2.7 pg/mL (ref 2.3–4.2)

## 2023-06-21 NOTE — Progress Notes (Signed)
No critical labs need to be addressed urgently. We will discuss labs in detail at upcoming office visit.

## 2023-06-28 ENCOUNTER — Ambulatory Visit: Payer: Federal, State, Local not specified - PPO | Admitting: Family Medicine

## 2023-06-28 ENCOUNTER — Encounter: Payer: Self-pay | Admitting: Family Medicine

## 2023-06-28 VITALS — BP 110/60 | HR 59 | Temp 98.1°F | Ht 65.5 in | Wt 233.5 lb

## 2023-06-28 DIAGNOSIS — E66812 Obesity, class 2: Secondary | ICD-10-CM | POA: Diagnosis not present

## 2023-06-28 DIAGNOSIS — E039 Hypothyroidism, unspecified: Secondary | ICD-10-CM

## 2023-06-28 DIAGNOSIS — R7303 Prediabetes: Secondary | ICD-10-CM

## 2023-06-28 DIAGNOSIS — I10 Essential (primary) hypertension: Secondary | ICD-10-CM | POA: Diagnosis not present

## 2023-06-28 DIAGNOSIS — E7800 Pure hypercholesterolemia, unspecified: Secondary | ICD-10-CM

## 2023-06-28 DIAGNOSIS — Z0000 Encounter for general adult medical examination without abnormal findings: Secondary | ICD-10-CM

## 2023-06-28 DIAGNOSIS — D333 Benign neoplasm of cranial nerves: Secondary | ICD-10-CM

## 2023-06-28 DIAGNOSIS — C8510 Unspecified B-cell lymphoma, unspecified site: Secondary | ICD-10-CM

## 2023-06-28 DIAGNOSIS — Z6836 Body mass index (BMI) 36.0-36.9, adult: Secondary | ICD-10-CM

## 2023-06-28 DIAGNOSIS — R0609 Other forms of dyspnea: Secondary | ICD-10-CM

## 2023-06-28 NOTE — Progress Notes (Signed)
Patient ID: Kristine [name], female    DOB: 1940/11/11, 83 y.o.   MRN: [id number]  This visit was conducted in person.  BP 110/60 (BP Location: Left Arm, Patient Position: Sitting, Cuff Size: Large)   Pulse (!) 59   Temp 98.1 F (36.7 C) (Temporal)   Ht 5' 5.5" (1.664 m)   Wt 233 lb 8 oz (105.9 kg)   SpO2 97%   BMI 38.27 kg/m    CC:  Chief Complaint  Patient presents with   Annual Exam    Subjective:   HPI: Kristine [name] is a 83 y.o. female presenting on 06/28/2023 for Annual Exam The patient presents for  complete physical and review of chronic health problems. 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  After spinal fusion.. her back pain is improved, Still with some leg pain. She is still using gabapentin 600 mg 3 times daily.  Only using hydrocodone off and on.  Not using tramadol.  Hypertension:   Improved control on  HCTZ 25 mg daily and valsartan160 mg daily BP Readings from Last 3 Encounters:  06/28/23 110/60  02/20/23 (!) 148/80  02/07/23 (!) 148/68  Using medication without problems or lightheadedness: none Chest pain with exertion: none Edema:none Short of breath: occ spells of SOB in heat  Average home BPs: 134/76 Other issues:  Prediabetes  Lab Results  Component Value Date   HGBA1C 6.4 06/21/2023    Elevated Cholesterol:  Almost at goal on   crestor 10mg  daily Lab Results  Component Value Date   CHOL 164 06/21/2023   HDL 57.40 06/21/2023   LDLCALC 79 06/21/2023   LDLDIRECT 105.0 06/14/2020   TRIG 137.0 06/21/2023   CHOLHDL 3 06/21/2023  Using medications without problems: Muscle aches: none Diet compliance:none Exercise: walking off and on, has joined [employer] Other complaints:  OAB,  previously controlled on vesicare 5 mg daily.   Hypothyroidism  Stable control on levo 75 mcg daily.  Normal free T3 and free T4 Lab Results  Component  Value Date   TSH 4.31 06/21/2023     Obesity:  Wt Readings from Last 3 Encounters:  06/28/23 233 lb 8 oz (105.9 kg)  02/07/23 227 lb (103 kg)  12/31/22 228 lb 6 oz (103.6 kg)    Lab Results  Component Value Date   HGBA1C 6.4 06/21/2023       OAB.Marland Kitchen vesicare not helping.. consider urologic referral if interested.  Relevant past medical, surgical, family and social history reviewed and updated as indicated. Interim medical history since our last visit reviewed. Allergies and medications reviewed and updated. Outpatient Medications Prior to Visit  Medication Sig Dispense Refill   ALPRAZolam (XANAX) 0.25 MG tablet Take 1-2 tablets (0.25-0.5 mg total) by mouth daily as needed (prior to imaging). 2 tablet 0   amoxicillin (AMOXIL) 500 MG capsule Take 500 mg by mouth See admin instructions. Take 2000 mg by mouth 1 hour prior to dental procedures     Azelastine HCl 137 MCG/SPRAY SOLN Place into both nostrils.     colchicine 0.6 MG tablet Take 1 tablet (0.6 mg total) by mouth 2 (two) times daily as needed (for gout flare). 90 tablet 1   cyanocobalamin 2000 MCG tablet Take 2,000 mcg by mouth daily.     estrogens, conjugated, (PREMARIN) 0.625 MG tablet TAKE 1 TABLET EVERY EVENING 90 tablet 3   fexofenadine (ALLEGRA) 180  MG tablet Take 180 mg by mouth as needed for allergies or rhinitis.     fluticasone (FLONASE) 50 MCG/ACT nasal spray Place 2 sprays into both nostrils daily as needed for allergies.     folic acid (FOLVITE) 1 MG tablet Take 2 mg by mouth daily.     gabapentin (NEURONTIN) 600 MG tablet TAKE 1 TABLET 3 TIMES A    DAY. 270 tablet 1   hydrochlorothiazide (HYDRODIURIL) 25 MG tablet TAKE 1 TABLET (25 MG TOTAL) BY MOUTH DAILY. 90 tablet 0   levothyroxine (SYNTHROID) 75 MCG tablet TAKE 1 TABLET DAILY BEFORE BREAKFAST 90 tablet 1   methocarbamol (ROBAXIN) 500 MG tablet TAKE 1 TABLET BY MOUTH EVERY 6 HOURS AS NEEDED FOR MUSCLE SPASMS. 60 tablet 0   methotrexate (RHEUMATREX) 2.5 MG tablet  Take 15 mg by mouth every Wednesday.     montelukast (SINGULAIR) 10 MG tablet TAKE 1 TABLET AT BEDTIME 90 tablet 3   Neomy-Bacit-Polymyx-Pramoxine (TRIPLE ANTIBIOTIC PAIN RELIEF) 1 % OINT Apply 1 application  topically as needed (wound care).     Omega-3 Fatty Acids (FISH OIL) 1200 MG CAPS Take 2,400 mg by mouth in the morning, at noon, and at bedtime.     omeprazole (PRILOSEC) 20 MG capsule Take 20 mg by mouth daily.     polyethylene glycol (MIRALAX / GLYCOLAX) packet Take 17 g by mouth daily as needed.     Probiotic Product (PROBIOTIC PO) Take 1 tablet by mouth daily as needed.     rosuvastatin (CRESTOR) 10 MG tablet TAKE 1 TABLET BY MOUTH EVERY DAY 90 tablet 0   solifenacin (VESICARE) 10 MG tablet TAKE 1 TABLET DAILY 90 tablet 0   valsartan (DIOVAN) 160 MG tablet TAKE 1 TABLET BY MOUTH EVERY DAY 90 tablet 3   No facility-administered medications prior to visit.     Per HPI unless specifically indicated in ROS section below Review of Systems  Constitutional:  Negative for fatigue and fever.  HENT:  Negative for congestion.   Eyes:  Negative for pain.  Respiratory:  Negative for cough and shortness of breath.   Cardiovascular:  Negative for chest pain, palpitations and leg swelling.  Gastrointestinal:  Negative for abdominal pain.  Genitourinary:  Negative for dysuria and vaginal bleeding.  Musculoskeletal:  Negative for back pain.  Neurological:  Negative for syncope, light-headedness and headaches.  Psychiatric/Behavioral:  Negative for dysphoric mood.    Objective:  BP 110/60 (BP Location: Left Arm, Patient Position: Sitting, Cuff Size: Large)   Pulse (!) 59   Temp 98.1 F (36.7 C) (Temporal)   Ht 5' 5.5" (1.664 m)   Wt 233 lb 8 oz (105.9 kg)   SpO2 97%   BMI 38.27 kg/m   Wt Readings from Last 3 Encounters:  06/28/23 233 lb 8 oz (105.9 kg)  02/07/23 227 lb (103 kg)  12/31/22 228 lb 6 oz (103.6 kg)      Physical Exam Vitals and nursing note reviewed.  Constitutional:       General: She is not in acute distress.    Appearance: Normal appearance. She is well-developed. She is not ill-appearing or toxic-appearing.  HENT:     Head: Normocephalic.     Right Ear: Hearing, tympanic membrane, ear canal and external ear normal.     Left Ear: Hearing, tympanic membrane, ear canal and external ear normal.     Nose: Nose normal.  Eyes:     General: Lids are normal. Lids are everted, no foreign bodies appreciated.  Conjunctiva/sclera: Conjunctivae normal.     Pupils: Pupils are equal, round, and reactive to light.  Neck:     Thyroid: No thyroid mass or thyromegaly.     Vascular: No carotid bruit.     Trachea: Trachea normal.  Cardiovascular:     Rate and Rhythm: Normal rate and regular rhythm.     Heart sounds: Normal heart sounds, S1 normal and S2 normal. No murmur heard.    No gallop.  Pulmonary:     Effort: Pulmonary effort is normal. No respiratory distress.     Breath sounds: Normal breath sounds. No wheezing, rhonchi or rales.  Abdominal:     General: Bowel sounds are normal. There is no distension or abdominal bruit.     Palpations: Abdomen is soft. There is no fluid wave or mass.     Tenderness: There is no abdominal tenderness. There is no guarding or rebound.     Hernia: No hernia is present.  Musculoskeletal:     Cervical back: Normal range of motion and neck supple.     Right lower leg: Edema present.     Left lower leg: Edema present.  Lymphadenopathy:     Cervical: No cervical adenopathy.  Skin:    General: Skin is warm and dry.     Findings: No rash.  Neurological:     Mental Status: She is alert.     Cranial Nerves: No cranial nerve deficit.     Sensory: No sensory deficit.  Psychiatric:        Mood and Affect: Mood is not anxious or depressed.        Speech: Speech normal.        Behavior: Behavior normal. Behavior is cooperative.        Judgment: Judgment normal.       Results for orders placed or performed in visit on  06/21/23  CBC with Differential/Platelet   Collection Time: 06/21/23  9:29 AM  Result Value Ref Range   WBC 6.0 4.0 - 10.5 K/uL   RBC 4.35 3.87 - 5.11 Mil/uL   Hemoglobin 12.9 12.0 - 15.0 g/dL   HCT 45.4 09.8 - 11.9 %   MCV 88.7 78.0 - 100.0 fl   MCHC 33.4 30.0 - 36.0 g/dL   RDW 14.7 82.9 - 56.2 %   Platelets 199.0 150.0 - 400.0 K/uL   Neutrophils Relative % 53.5 43.0 - 77.0 %   Lymphocytes Relative 34.7 12.0 - 46.0 %   Monocytes Relative 7.5 3.0 - 12.0 %   Eosinophils Relative 3.5 0.0 - 5.0 %   Basophils Relative 0.8 0.0 - 3.0 %   Neutro Abs 3.2 1.4 - 7.7 K/uL   Lymphs Abs 2.1 0.7 - 4.0 K/uL   Monocytes Absolute 0.4 0.1 - 1.0 K/uL   Eosinophils Absolute 0.2 0.0 - 0.7 K/uL   Basophils Absolute 0.0 0.0 - 0.1 K/uL  T3, free   Collection Time: 06/21/23  9:29 AM  Result Value Ref Range   T3, Free 2.7 2.3 - 4.2 pg/mL  TSH   Collection Time: 06/21/23  9:29 AM  Result Value Ref Range   TSH 4.31 0.35 - 5.50 uIU/mL  T4, free   Collection Time: 06/21/23  9:29 AM  Result Value Ref Range   Free T4 1.02 0.60 - 1.60 ng/dL  Hemoglobin Z3Y   Collection Time: 06/21/23  9:29 AM  Result Value Ref Range   Hgb A1c MFr Bld 6.4 4.6 - 6.5 %  Lipid panel  Collection Time: 06/21/23  9:29 AM  Result Value Ref Range   Cholesterol 164 0 - 200 mg/dL   Triglycerides 454.0 0.0 - 149.0 mg/dL   HDL 98.11 >91.47 mg/dL   VLDL 82.9 0.0 - 56.2 mg/dL   LDL Cholesterol 79 0 - 99 mg/dL   Total CHOL/HDL Ratio 3    NonHDL 106.17   Comprehensive metabolic panel   Collection Time: 06/21/23  9:29 AM  Result Value Ref Range   Sodium 140 135 - 145 mEq/L   Potassium 4.1 3.5 - 5.1 mEq/L   Chloride 103 96 - 112 mEq/L   CO2 28 19 - 32 mEq/L   Glucose, Bld 120 (H) 70 - 99 mg/dL   BUN 20 6 - 23 mg/dL   Creatinine, Ser 1.30 0.40 - 1.20 mg/dL   Total Bilirubin 0.7 0.2 - 1.2 mg/dL   Alkaline Phosphatase 48 39 - 117 U/L   AST 11 0 - 37 U/L   ALT 10 0 - 35 U/L   Total Protein 5.8 (L) 6.0 - 8.3 g/dL   Albumin  4.0 3.5 - 5.2 g/dL   GFR 86.57 >84.69 mL/min   Calcium 9.1 8.4 - 10.5 mg/dL    This visit occurred during the SARS-CoV-2 public health emergency.  Safety protocols were in place, including screening questions prior to the visit, additional usage of staff PPE, and extensive cleaning of exam room while observing appropriate contact time as indicated for disinfecting solutions.   COVID 19 screen:  No recent travel or known exposure to COVID19 The patient denies respiratory symptoms of COVID 19 at this time. The importance of social distancing was discussed today.   Assessment and Plan   The patient's preventative maintenance and recommended screening tests for an annual wellness exam were reviewed in full today. Brought up to date unless services declined.  Counselled on the importance of diet, exercise, and its role in overall health and mortality. The patient's FH and SH was reviewed, including their home life, tobacco status, and drug and alcohol status.   Vaccines: uptodate  PNA, zoster. SE to flu in past refused Pap/DVE: Pap/DVE not indicated.   asymptomatic Mammo:07/2022, plans yearly Bone Density:06/16/21 wrist nml.. Plan repeat in 5 years Colon: 04/2018 Dr. Lavon Paganini high grade tubular adenoma.. repeat  09/2021 no further indicated Smoking Status: none   Problem List Items Addressed This Visit     B-cell non-Hodgkin lymphoma (HCC)   Followed by oncology/dermatology.      Class 2 severe obesity due to excess calories with serious comorbidity and body mass index (BMI) of 36.0 to 36.9 in adult [facility])   Encouraged exercise, weight loss, healthy eating habits.       Essential hypertension, benign (Chronic)   Chronic, well controlled.    HCTZ 25 mg daily and valsartan160 mg daily      Hypothyroidism (Chronic)   Stable, chronic.  Continue current medication.   Stable control on levo 75 mcg daily.      Prediabetes (Chronic)   Chronic, worsened control discussed low  carbohydrate diet, activity as tolerated and continued weight loss.      Pure hypercholesterolemia (Chronic)   Chronic,  improved control  tolerating well. LDL  almost  at goal less than 70  better  on Crestor 10 mg daily.  Encouraged exercise, weight loss, healthy eating habits.       Unilateral vestibular schwannoma (HCC)   Followed by ENT      Other Visit Diagnoses  Routine general medical examination at a health care facility    -  Primary         Kerby Nora, MD

## 2023-06-28 NOTE — Patient Instructions (Signed)
Get back on track with low carb diet, continue regular walking.

## 2023-06-28 NOTE — Assessment & Plan Note (Signed)
Stable, chronic.  Continue current medication.   Stable control on levo 75 mcg daily. 

## 2023-06-28 NOTE — Assessment & Plan Note (Signed)
Chronic, stable.. not frequent. No chest pain. Sometimes associated with  anxiety/ stress.

## 2023-06-28 NOTE — Assessment & Plan Note (Signed)
Chronic, worsened control discussed low carbohydrate diet, activity as tolerated and continued weight loss.

## 2023-06-28 NOTE — Assessment & Plan Note (Signed)
 Encouraged exercise, weight loss, healthy eating habits. ? ?

## 2023-06-28 NOTE — Assessment & Plan Note (Signed)
Chronic,  improved control  tolerating well. LDL  almost  at goal less than 70  better  on Crestor 10 mg daily.  Encouraged exercise, weight loss, healthy eating habits.

## 2023-06-28 NOTE — Assessment & Plan Note (Signed)
Followed by oncology/dermatology.

## 2023-06-28 NOTE — Assessment & Plan Note (Signed)
Chronic, well controlled.    HCTZ 25 mg daily and valsartan160 mg daily

## 2023-06-28 NOTE — Assessment & Plan Note (Signed)
 Followed by ENT

## 2023-07-22 ENCOUNTER — Other Ambulatory Visit: Payer: Self-pay | Admitting: Family Medicine

## 2023-07-22 DIAGNOSIS — Z1231 Encounter for screening mammogram for malignant neoplasm of breast: Secondary | ICD-10-CM

## 2023-07-27 ENCOUNTER — Other Ambulatory Visit: Payer: Self-pay | Admitting: Family Medicine

## 2023-08-03 ENCOUNTER — Other Ambulatory Visit: Payer: Self-pay | Admitting: Family Medicine

## 2023-08-06 ENCOUNTER — Ambulatory Visit
Admission: RE | Admit: 2023-08-06 | Discharge: 2023-08-06 | Disposition: A | Discharge status: Home / Self Care | Payer: Federal, State, Local not specified - PPO | Source: Ambulatory Visit | Attending: Family Medicine | Admitting: Family Medicine

## 2023-08-06 DIAGNOSIS — Z1231 Encounter for screening mammogram for malignant neoplasm of breast: Secondary | ICD-10-CM

## 2023-08-07 ENCOUNTER — Other Ambulatory Visit: Payer: Self-pay | Admitting: Family Medicine

## 2023-08-19 ENCOUNTER — Other Ambulatory Visit: Payer: Self-pay | Admitting: Family Medicine

## 2023-09-07 ENCOUNTER — Other Ambulatory Visit: Payer: Self-pay | Admitting: Family Medicine

## 2023-10-16 ENCOUNTER — Telehealth: Payer: Self-pay | Admitting: Family Medicine

## 2023-10-16 NOTE — Telephone Encounter (Signed)
 Copied from CRM [id number]. Topic: Clinical - Medication Question >> Oct 16, 2023  3:45 PM Essie A wrote: Reason for CRM: Patient is wondering why her prescription for montelukast  (SINGULAIR ) 10 MG tablet, 90-day supply has not been refilled.  Please let her know if there is an issue by calling her at [phone].  This 90-day supply has to be filled at [facility] - [place], [place] - [address] AT [facility] to [facility] [employer] [facility] [address] [place] [address] Phone: [phone] Fax: [phone]

## 2023-10-17 MED ORDER — MONTELUKAST SODIUM 10 MG PO TABS: 10.0000 mg | ORAL_TABLET | Freq: Every day | ORAL | 3 refills | Status: AC

## 2023-10-17 NOTE — Telephone Encounter (Addendum)
 Ms. Mccammon should have an available refill at [facility] per our record but I will go ahead and send in refills.  Left message for Ms. Seder that new refills have been sent to [facility].

## 2023-10-17 NOTE — Addendum Note (Signed)
 Addended by: Wyn Heater on: 10/17/2023 08:09 AM   Modules accepted: Orders

## 2023-11-27 ENCOUNTER — Telehealth: Payer: Self-pay | Admitting: *Deleted

## 2023-11-27 DIAGNOSIS — E7800 Pure hypercholesterolemia, unspecified: Secondary | ICD-10-CM

## 2023-11-27 DIAGNOSIS — R7303 Prediabetes: Secondary | ICD-10-CM

## 2023-11-27 NOTE — Telephone Encounter (Signed)
-----   Message from [name] sent at 11/27/2023  3:45 PM EDT ----- Regarding: Lab orders for [facility], 7.24.25 Lab orders for a 6 month follow up appt

## 2023-12-19 ENCOUNTER — Other Ambulatory Visit (INDEPENDENT_AMBULATORY_CARE_PROVIDER_SITE_OTHER): Payer: Federal, State, Local not specified - PPO

## 2023-12-19 DIAGNOSIS — E7800 Pure hypercholesterolemia, unspecified: Secondary | ICD-10-CM | POA: Diagnosis not present

## 2023-12-19 DIAGNOSIS — R7303 Prediabetes: Secondary | ICD-10-CM | POA: Diagnosis not present

## 2023-12-19 LAB — LIPID PANEL
Cholesterol: 164 mg/dL (ref 0–200)
HDL: 59.1 mg/dL (ref >39.00)
LDL Cholesterol: 75 mg/dL (ref 0–99)
NonHDL: 105.38
Total CHOL/HDL Ratio: 3
Triglycerides: 150 mg/dL — ABNORMAL HIGH (ref 0.0–149.0)
VLDL: 30 mg/dL (ref 0.0–40.0)

## 2023-12-19 LAB — COMPREHENSIVE METABOLIC PANEL WITH GFR
ALT: 9 U/L (ref 0–35)
AST: 10 U/L (ref 0–37)
Albumin: 4.1 g/dL (ref 3.5–5.2)
Alkaline Phosphatase: 45 U/L (ref 39–117)
BUN: 19 mg/dL (ref 6–23)
CO2: 29 meq/L (ref 19–32)
Calcium: 9 mg/dL (ref 8.4–10.5)
Chloride: 104 meq/L (ref 96–112)
Creatinine, Ser: 0.69 mg/dL (ref 0.40–1.20)
GFR: 79.97 mL/min (ref >60.00)
Glucose, Bld: 122 mg/dL — ABNORMAL HIGH (ref 70–99)
Potassium: 3.9 meq/L (ref 3.5–5.1)
Sodium: 140 meq/L (ref 135–145)
Total Bilirubin: 0.8 mg/dL (ref 0.2–1.2)
Total Protein: 6.1 g/dL (ref 6.0–8.3)

## 2023-12-19 LAB — HEMOGLOBIN A1C: Hgb A1c MFr Bld: 6.3 % (ref 4.6–6.5)

## 2023-12-26 ENCOUNTER — Ambulatory Visit: Payer: Federal, State, Local not specified - PPO | Admitting: Family Medicine

## 2023-12-26 ENCOUNTER — Encounter: Payer: Self-pay | Admitting: Family Medicine

## 2023-12-26 VITALS — BP 120/60 | HR 78 | Temp 97.5°F | Ht 65.0 in | Wt 233.4 lb

## 2023-12-26 DIAGNOSIS — E7800 Pure hypercholesterolemia, unspecified: Secondary | ICD-10-CM

## 2023-12-26 DIAGNOSIS — R7303 Prediabetes: Secondary | ICD-10-CM | POA: Diagnosis not present

## 2023-12-26 NOTE — Progress Notes (Signed)
 Patient ID: Kristine [name], female    DOB: 1941-05-16, 83 y.o.   MRN: [id number]  This visit was conducted in person.  BP 120/60   Pulse 78   Temp (!) 97.5 F (36.4 C) (Temporal)   Ht 5' 5 (1.651 m)   Wt 233 lb 6 oz (105.9 kg)   SpO2 99%   BMI 38.84 kg/m    CC:  Chief Complaint  Patient presents with   Prediabetes   Hyperlipidemia    Subjective:   HPI: Kristine [name] is a 83 y.o. female presenting on 12/26/2023 for Prediabetes and Hyperlipidemia   Her husband has had a new diagnosis of B cell lympohoma... increased stres reguarding this.  Hypertension:   Improved control on  HCTZ 25 mg daily and valsartan160 mg daily BP Readings from Last 3 Encounters:  12/26/23 120/60  06/28/23 110/60  02/20/23 (!) 148/80  Using medication without problems or lightheadedness: none Chest pain with exertion: none Edema:none Short of breath:  improved from last time Average home BPs: 134/76 Other issues:  Prediabetes  Lab Results  Component Value Date   HGBA1C 6.3 12/19/2023    Elevated Cholesterol:  Almost at goal on   crestor  10mg  daily Lab Results  Component Value Date   CHOL 164 12/19/2023   HDL 59.10 12/19/2023   LDLCALC 75 12/19/2023   LDLDIRECT 105.0 06/14/2020   TRIG 150.0 (H) 12/19/2023   CHOLHDL 3 12/19/2023  Using medications without problems: Muscle aches: none Diet compliance:none Exercise: walking off and on, has joined [employer] Other complaints:    Obesity:  Wt Readings from Last 3 Encounters:  12/26/23 233 lb 6 oz (105.9 kg)  06/28/23 233 lb 8 oz (105.9 kg)  02/07/23 227 lb (103 kg)    Relevant past medical, surgical, family and social history reviewed and updated as indicated. Interim medical history since our last visit reviewed. Allergies and medications reviewed and updated. Outpatient Medications Prior to Visit  Medication Sig Dispense Refill   ALPRAZolam  (XANAX ) 0.25 MG tablet Take 1-2 tablets (0.25-0.5 mg total) by mouth daily as needed  (prior to imaging). 2 tablet 0   amoxicillin  (AMOXIL ) 500 MG capsule Take 500 mg by mouth See admin instructions. Take 2000 mg by mouth 1 hour prior to dental procedures     Azelastine HCl 137 MCG/SPRAY SOLN Place into both nostrils.     colchicine  0.6 MG tablet Take 1 tablet (0.6 mg total) by mouth 2 (two) times daily as needed (for gout flare). 90 tablet 1   cyanocobalamin  2000 MCG tablet Take 2,000 mcg by mouth daily.     fexofenadine (ALLEGRA) 180 MG tablet Take 180 mg by mouth as needed for allergies or rhinitis.     fluticasone  (FLONASE ) 50 MCG/ACT nasal spray Place 2 sprays into both nostrils daily as needed for allergies.     folic acid  (FOLVITE ) 1 MG tablet Take 2 mg by mouth daily.     gabapentin  (NEURONTIN ) 600 MG tablet TAKE 1 TABLET 3 TIMES A    DAY. 270 tablet 1   hydrochlorothiazide  (HYDRODIURIL ) 25 MG tablet TAKE 1 TABLET (25 MG TOTAL) BY MOUTH DAILY. 90 tablet 3   levothyroxine  (SYNTHROID ) 75 MCG tablet TAKE 1 TABLET DAILY BEFORE BREAKFAST 90 tablet 1   methocarbamol  (ROBAXIN ) 500 MG tablet TAKE 1 TABLET BY MOUTH EVERY 6 HOURS AS NEEDED FOR MUSCLE SPASMS. 60 tablet 0   methotrexate (RHEUMATREX) 2.5 MG tablet Take 15 mg by mouth every Wednesday.  montelukast  (SINGULAIR ) 10 MG tablet Take 1 tablet (10 mg total) by mouth at bedtime. 90 tablet 3   Neomy-Bacit-Polymyx-Pramoxine (TRIPLE ANTIBIOTIC PAIN RELIEF) 1 % OINT Apply 1 application  topically as needed (wound care).     Omega-3 Fatty Acids (FISH OIL) 1200 MG CAPS Take 2,400 mg by mouth in the morning, at noon, and at bedtime.     omeprazole (PRILOSEC) 20 MG capsule Take 20 mg by mouth daily.     polyethylene glycol (MIRALAX  / GLYCOLAX ) packet Take 17 g by mouth daily as needed.     PREMARIN  0.625 MG tablet TAKE 1 TABLET EVERY EVENING 90 tablet 3   Probiotic Product (PROBIOTIC PO) Take 1 tablet by mouth daily as needed.     rosuvastatin  (CRESTOR ) 10 MG tablet TAKE 1 TABLET BY MOUTH EVERY DAY 90 tablet 3   solifenacin   (VESICARE ) 10 MG tablet TAKE 1 TABLET DAILY 90 tablet 3   valsartan  (DIOVAN ) 160 MG tablet TAKE 1 TABLET BY MOUTH EVERY DAY 90 tablet 3   No facility-administered medications prior to visit.     Per HPI unless specifically indicated in ROS section below Review of Systems  Constitutional:  Negative for fatigue and fever.  HENT:  Negative for congestion.   Eyes:  Negative for pain.  Respiratory:  Negative for cough and shortness of breath.   Cardiovascular:  Negative for chest pain, palpitations and leg swelling.  Gastrointestinal:  Negative for abdominal pain.  Genitourinary:  Negative for dysuria and vaginal bleeding.  Musculoskeletal:  Negative for back pain.  Neurological:  Negative for syncope, light-headedness and headaches.  Psychiatric/Behavioral:  Negative for dysphoric mood.    Objective:  BP 120/60   Pulse 78   Temp (!) 97.5 F (36.4 C) (Temporal)   Ht 5' 5 (1.651 m)   Wt 233 lb 6 oz (105.9 kg)   SpO2 99%   BMI 38.84 kg/m   Wt Readings from Last 3 Encounters:  12/26/23 233 lb 6 oz (105.9 kg)  06/28/23 233 lb 8 oz (105.9 kg)  02/07/23 227 lb (103 kg)      Physical Exam Vitals and nursing note reviewed.  Constitutional:      General: She is not in acute distress.    Appearance: Normal appearance. She is well-developed. She is not ill-appearing or toxic-appearing.  HENT:     Head: Normocephalic.     Right Ear: Hearing, tympanic membrane, ear canal and external ear normal.     Left Ear: Hearing, tympanic membrane, ear canal and external ear normal.     Nose: Nose normal.  Eyes:     General: Lids are normal. Lids are everted, no foreign bodies appreciated.     Conjunctiva/sclera: Conjunctivae normal.     Pupils: Pupils are equal, round, and reactive to light.  Neck:     Thyroid : No thyroid  mass or thyromegaly.     Vascular: No carotid bruit.     Trachea: Trachea normal.  Cardiovascular:     Rate and Rhythm: Normal rate and regular rhythm.     Heart sounds:  Normal heart sounds, S1 normal and S2 normal. No murmur heard.    No gallop.  Pulmonary:     Effort: Pulmonary effort is normal. No respiratory distress.     Breath sounds: Normal breath sounds. No wheezing, rhonchi or rales.  Abdominal:     General: Bowel sounds are normal. There is no distension or abdominal bruit.     Palpations: Abdomen is soft. There is  no fluid wave or mass.     Tenderness: There is no abdominal tenderness. There is no guarding or rebound.     Hernia: No hernia is present.  Musculoskeletal:     Cervical back: Normal range of motion and neck supple.     Right lower leg: Edema present.     Left lower leg: Edema present.  Lymphadenopathy:     Cervical: No cervical adenopathy.  Skin:    General: Skin is warm and dry.     Findings: No rash.  Neurological:     Mental Status: She is alert.     Cranial Nerves: No cranial nerve deficit.     Sensory: No sensory deficit.  Psychiatric:        Mood and Affect: Mood is not anxious or depressed.        Speech: Speech normal.        Behavior: Behavior normal. Behavior is cooperative.        Judgment: Judgment normal.       Results for orders placed or performed in visit on 12/19/23  Hemoglobin A1c   Collection Time: 12/19/23  8:35 AM  Result Value Ref Range   Hgb A1c MFr Bld 6.3 4.6 - 6.5 %  Lipid panel   Collection Time: 12/19/23  8:35 AM  Result Value Ref Range   Cholesterol 164 0 - 200 mg/dL   Triglycerides 849.9 (H) 0.0 - 149.0 mg/dL   HDL 40.89 >60.99 mg/dL   VLDL 69.9 0.0 - 59.9 mg/dL   LDL Cholesterol 75 0 - 99 mg/dL   Total CHOL/HDL Ratio 3    NonHDL 105.38   Comprehensive metabolic panel   Collection Time: 12/19/23  8:35 AM  Result Value Ref Range   Sodium 140 135 - 145 mEq/L   Potassium 3.9 3.5 - 5.1 mEq/L   Chloride 104 96 - 112 mEq/L   CO2 29 19 - 32 mEq/L   Glucose, Bld 122 (H) 70 - 99 mg/dL   BUN 19 6 - 23 mg/dL   Creatinine, Ser 9.30 0.40 - 1.20 mg/dL   Total Bilirubin 0.8 0.2 - 1.2 mg/dL    Alkaline Phosphatase 45 39 - 117 U/L   AST 10 0 - 37 U/L   ALT 9 0 - 35 U/L   Total Protein 6.1 6.0 - 8.3 g/dL   Albumin  4.1 3.5 - 5.2 g/dL   GFR 20.02 >39.99 mL/min   Calcium  9.0 8.4 - 10.5 mg/dL    This visit occurred during the SARS-CoV-2 public health emergency.  Safety protocols were in place, including screening questions prior to the visit, additional usage of staff PPE, and extensive cleaning of exam room while observing appropriate contact time as indicated for disinfecting solutions.   COVID 19 screen:  No recent travel or known exposure to COVID19 The patient denies respiratory symptoms of COVID 19 at this time. The importance of social distancing was discussed today.   Assessment and Plan    Problem List Items Addressed This Visit     Prediabetes (Chronic)   Chronic, improving  control discussed low carbohydrate diet, activity as tolerated and continued weight loss.      Pure hypercholesterolemia - Primary (Chronic)   Chronic,  improved control  tolerating well. LDL  almost  at goal less than 70  better  on Crestor  10 mg daily.  Encouraged exercise, weight loss, healthy eating habits.           Greig Ring, MD

## 2023-12-26 NOTE — Assessment & Plan Note (Signed)
Chronic,  improved control  tolerating well. LDL  almost  at goal less than 70  better  on Crestor 10 mg daily.  Encouraged exercise, weight loss, healthy eating habits.

## 2023-12-26 NOTE — Assessment & Plan Note (Signed)
 Chronic, improving  control discussed low carbohydrate diet, activity as tolerated and continued weight loss.

## 2023-12-27 ENCOUNTER — Other Ambulatory Visit: Payer: Self-pay | Admitting: Family Medicine

## 2023-12-30 NOTE — Telephone Encounter (Signed)
 Last office visit 12/26/2023 for Prediabetes/Hypercholesterolemia.  Last refilled 11/28/2022 for #[id number] with 1 refill.  Next Appt: CPE 07/02/2024.

## 2024-01-21 ENCOUNTER — Other Ambulatory Visit: Payer: Self-pay | Admitting: Family Medicine

## 2024-03-03 ENCOUNTER — Encounter: Payer: Self-pay | Admitting: Dermatology

## 2024-03-03 ENCOUNTER — Ambulatory Visit: Payer: Federal, State, Local not specified - PPO | Admitting: Dermatology

## 2024-03-03 DIAGNOSIS — W908XXA Exposure to other nonionizing radiation, initial encounter: Secondary | ICD-10-CM | POA: Diagnosis not present

## 2024-03-03 DIAGNOSIS — D1720 Benign lipomatous neoplasm of skin and subcutaneous tissue of unspecified limb: Secondary | ICD-10-CM

## 2024-03-03 DIAGNOSIS — D225 Melanocytic nevi of trunk: Secondary | ICD-10-CM

## 2024-03-03 DIAGNOSIS — L821 Other seborrheic keratosis: Secondary | ICD-10-CM

## 2024-03-03 DIAGNOSIS — I781 Nevus, non-neoplastic: Secondary | ICD-10-CM

## 2024-03-03 DIAGNOSIS — L814 Other melanin hyperpigmentation: Secondary | ICD-10-CM | POA: Diagnosis not present

## 2024-03-03 DIAGNOSIS — Z1283 Encounter for screening for malignant neoplasm of skin: Secondary | ICD-10-CM | POA: Diagnosis not present

## 2024-03-03 DIAGNOSIS — D1801 Hemangioma of skin and subcutaneous tissue: Secondary | ICD-10-CM

## 2024-03-03 DIAGNOSIS — Z8571 Personal history of Hodgkin lymphoma: Secondary | ICD-10-CM

## 2024-03-03 DIAGNOSIS — M793 Panniculitis, unspecified: Secondary | ICD-10-CM

## 2024-03-03 DIAGNOSIS — D229 Melanocytic nevi, unspecified: Secondary | ICD-10-CM

## 2024-03-03 DIAGNOSIS — L578 Other skin changes due to chronic exposure to nonionizing radiation: Secondary | ICD-10-CM

## 2024-03-03 MED ORDER — TRIAMCINOLONE ACETONIDE 0.1 % EX CREA: TOPICAL_CREAM | CUTANEOUS | 2 refills | Status: DC

## 2024-03-03 NOTE — Progress Notes (Signed)
 Follow-Up Visit   Subjective  Kristine [name] is a 83 y.o. female who presents for the following: Skin Cancer Screening and Full Body Skin Exam.   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 The patient has spots, moles and lesions to be evaluated, some may be new or changing and the patient may have concern these could be cancer.    The following portions of the chart were reviewed this encounter and updated as appropriate: medications, allergies, medical history  Review of Systems:  No other skin or systemic complaints except as noted in HPI or Assessment and Plan.  Objective  Well appearing patient in no apparent distress; mood and affect are within normal limits.  A full examination was performed including scalp, head, eyes, ears, nose, lips, neck, chest, axillae, abdomen, back, buttocks, bilateral upper extremities, bilateral lower extremities, hands, feet, fingers, toes, fingernails, and toenails. All findings within normal limits unless otherwise noted below.   Relevant physical exam findings are noted in the Assessment and Plan.    Assessment & Plan   SKIN CANCER SCREENING PERFORMED TODAY.  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 Hx of radiation with Dr. Lenn.  Unknown origin. PET negative Patient completed 20 day radiation with Dr Lenn in April 2024 Clear. Observe for recurrence. Call clinic for new or changing lesions.  Recommend regular skin exams, daily broad-spectrum spf 30+ sunscreen use, and photoprotection.       ACTINIC DAMAGE - Chronic condition, secondary to cumulative UV/sun exposure - diffuse scaly erythematous macules with underlying dyspigmentation - Recommend daily broad spectrum sunscreen SPF 30+ to sun-exposed areas, reapply every 2 hours as needed.  - Staying in the shade  or wearing long sleeves, sun glasses (UVA+UVB protection) and wide brim hats (4-inch brim around the entire circumference of the hat) are also recommended for sun protection.  - Call for new or changing lesions.  LENTIGINES, SEBORRHEIC KERATOSES, HEMANGIOMAS - Benign normal skin lesions - Benign-appearing - Call for any changes  MELANOCYTIC NEVI - Tan-brown and/or pink-flesh-colored symmetric macules and papules - 3.0 mm pink flesh papule at left anterior axilla  - Benign appearing on exam today - Observation - Call clinic for new or changing moles - Recommend daily use of broad spectrum spf 30+ sunscreen to sun-exposed areas.    Spider Veins - Dilated blue, purple or red veins at the lower extremities - Reassured - Smaller vessels can be treated by sclerotherapy (a procedure to inject a medicine into the veins to make them disappear) if desired, but the treatment is not covered by insurance. Larger vessels may be covered if symptomatic and we would refer to vascular surgeon if treatment desired.  LIPODERMATOSCLEROSIS Exam: tender hyperpigmented firm plaque +/- erythema at R lower leg with associated leg swelling and varicosities    Chronic and persistent condition with duration or expected duration over one year. Condition is bothersome/symptomatic for patient. Currently flared.  Lipodermatosclerosis is a chronic inflammatory condition of unknown cause of the subcutaneous fat causing tenderness, discoloration and hardening of the involved skin, most commonly on the lower legs. Discussed that it may progress and gradually worsen over time, especially in the setting of chronic leg swelling. Daily compression stockings/hose is recommended.    Treatment plan: Benign. No cure, may be progressive in poorly controlled lower leg stasis.  Recommend wearing compression socks daily. Elevate legs  as much as possible.   Start TMC 0.1% Cream qd/bid prn itch/burn.  Topical steroids (such as  triamcinolone , fluocinolone, fluocinonide, mometasone, clobetasol, halobetasol, betamethasone, hydrocortisone) can cause thinning and lightening of the skin if they are used for too long in the same area. Your physician has selected the right strength medicine for your problem and area affected on the body. Please use your medication only as directed by your physician to prevent side effects.    Lipoma  Exam: Subcutaneous rubbery nodules Location: scattered at arms  Benign-appearing. Exam most consistent with an Lipoma. Discussed that a Lipoma is a benign fatty growth that can grow over time and sometimes become painful or otherwise symptomatic. Some patients may have one or several lipomas.. Benign Hereditary Lipomatosis is a hereditary familial condition where family members tend to grow multiple lipomas.  Recommend observation if it is not changing, growing or symptomatic. Recommend surgical excision to remove it if it is painful, growing, symptomatic, or other changes noted. Please contact our office for new or changing lesions so they can be evaluated.    Return in about 1 year (around 03/03/2025) for TBSE.  I, Kate Fought, CMA, am acting as scribe for Rexene Rattler, MD.   Documentation: I have reviewed the above documentation for accuracy and completeness, and I agree with the above.  Rexene Rattler, MD

## 2024-03-03 NOTE — Patient Instructions (Addendum)
 Recommend wearing compression socks daily. Elevate legs as much as possible.   Start Triamcinolone  0.1% Cream once or twice daily to affected area on leg as needed for itch/burn.  Topical steroids (such as triamcinolone , fluocinolone, fluocinonide, mometasone, clobetasol, halobetasol, betamethasone, hydrocortisone) can cause thinning and lightening of the skin if they are used for too long in the same area. Your physician has selected the right strength medicine for your problem and area affected on the body. Please use your medication only as directed by your physician to prevent side effects.     Recommend daily broad spectrum sunscreen SPF 30+ to sun-exposed areas, reapply every 2 hours as needed. Call for new or changing lesions.  Staying in the shade or wearing long sleeves, sun glasses (UVA+UVB protection) and wide brim hats (4-inch brim around the entire circumference of the hat) are also recommended for sun protection.      Melanoma ABCDEs  Melanoma is the most dangerous type of skin cancer, and is the leading cause of death from skin disease.  You are more likely to develop melanoma if you: Have light-colored skin, light-colored eyes, or red or blond hair Spend a lot of time in the sun Tan regularly, either outdoors or in a tanning bed Have had blistering sunburns, especially during childhood Have a close family member who has had a melanoma Have atypical moles or large birthmarks  Early detection of melanoma is key since treatment is typically straightforward and cure rates are extremely high if we catch it early.   The first sign of melanoma is often a change in a mole or a new dark spot.  The ABCDE system is a way of remembering the signs of melanoma.  A for asymmetry:  The two halves do not match. B for border:  The edges of the growth are irregular. C for color:  A mixture of colors are present instead of an even brown color. D for diameter:  Melanomas are usually (but  not always) greater than 6mm - the size of a pencil eraser. E for evolution:  The spot keeps changing in size, shape, and color.  Please check your skin once per month between visits. You can use a small mirror in front and a large mirror behind you to keep an eye on the back side or your body.   If you see any new or changing lesions before your next follow-up, please call to schedule a visit.  Please continue daily skin protection including broad spectrum sunscreen SPF 30+ to sun-exposed areas, reapplying every 2 hours as needed when you're outdoors.   Staying in the shade or wearing long sleeves, sun glasses (UVA+UVB protection) and wide brim hats (4-inch brim around the entire circumference of the hat) are also recommended for sun protection.      Due to recent changes in healthcare laws, you may see results of your pathology and/or laboratory studies on MyChart before the doctors have had a chance to review them. We understand that in some cases there may be results that are confusing or concerning to you. Please understand that not all results are received at the same time and often the doctors may need to interpret multiple results in order to provide you with the best plan of care or course of treatment. Therefore, we ask that you please give us  2 business days to thoroughly review all your results before contacting the office for clarification. Should we see a critical lab result, you will be contacted sooner.  If You Need Anything After Your Visit  If you have any questions or concerns for your doctor, please call our main line at [phone] and press option 4 to reach your doctor's medical assistant. If no one answers, please leave a voicemail as directed and we will return your call as soon as possible. Messages left after 4 pm will be answered the following business day.   You may also send us  a message via MyChart. We typically respond to MyChart messages within 1-2 business  days.  For prescription refills, please ask your pharmacy to contact our office. Our fax number is [phone].  If you have an urgent issue when the clinic is closed that cannot wait until the next business day, you can page your doctor at the number below.    Please note that while we do our best to be available for urgent issues outside of office hours, we are not available 24/7.   If you have an urgent issue and are unable to reach us , you may choose to seek medical care at your doctor's office, retail clinic, urgent care center, or emergency room.  If you have a medical emergency, please immediately call 911 or go to the emergency department.  Pager Numbers  - Dr. Hester: [phone]  - Dr. Jackquline: [phone]  - Dr. Claudene: [phone]   - Dr. Raymund: [phone]  In the event of inclement weather, please call our main line at [phone] for an update on the status of any delays or closures.  Dermatology Medication Tips: Please keep the boxes that topical medications come in in order to help keep track of the instructions about where and how to use these. Pharmacies typically print the medication instructions only on the boxes and not directly on the medication tubes.   If your medication is too expensive, please contact our office at [phone] option 4 or send us  a message through MyChart.   We are unable to tell what your co-pay for medications will be in advance as this is different depending on your insurance coverage. However, we may be able to find a substitute medication at lower cost or fill out paperwork to get insurance to cover a needed medication.   If a prior authorization is required to get your medication covered by your insurance company, please allow us  1-2 business days to complete this process.  Drug prices often vary depending on where the prescription is filled and some pharmacies may offer cheaper prices.  The website www.goodrx.com contains  coupons for medications through different pharmacies. The prices here do not account for what the cost may be with help from insurance (it may be cheaper with your insurance), but the website can give you the price if you did not use any insurance.  - You can print the associated coupon and take it with your prescription to the pharmacy.  - You may also stop by our office during regular business hours and pick up a GoodRx coupon card.  - If you need your prescription sent electronically to a different pharmacy, notify our office through [facility] or by phone at [phone] option 4.     Si Usted Necesita Algo Despus de Su Visita  Tambin puede enviarnos un mensaje a travs de Clinical cytogeneticist. Por lo general respondemos a los mensajes de MyChart en el transcurso de 1 a 2 das hbiles.  Para renovar recetas, por favor pida a su farmacia que se ponga en contacto con nuestra oficina. Nuestro nmero de  fax es el [phone].  Si tiene un asunto urgente cuando la clnica est cerrada y que no puede esperar hasta el siguiente da hbil, puede llamar/localizar a su doctor(a) al nmero que aparece a continuacin.   Por favor, tenga en cuenta que aunque hacemos todo lo posible para estar disponibles para asuntos urgentes fuera del horario de [place], no estamos disponibles las 24 horas del da, los 7 [address]  [address] de la [place].   Si tiene un problema urgente y no puede comunicarse con nosotros, puede optar por buscar atencin mdica  en el consultorio de su doctor(a), en una clnica privada, en un centro de atencin urgente o en una sala de emergencias.  Si tiene Engineer, drilling, por favor llame inmediatamente al 911 o vaya a la sala de emergencias.  Nmeros de bper  - Dr. Hester: [phone]  - Dra. Jackquline: [phone]  - Dr. Claudene: [phone]  - Dra. Kitts: [phone]  En caso de inclemencias del [place], por favor llame a nuestra lnea principal al [phone] para una  actualizacin sobre el estado de cualquier retraso o cierre.  Consejos para la medicacin en dermatologa: Por favor, guarde las cajas en las que vienen los medicamentos de uso tpico para ayudarle a seguir las instrucciones sobre dnde y cmo usarlos. Las farmacias generalmente imprimen las instrucciones del medicamento slo en las cajas y no directamente en los tubos del [place].   Si su medicamento es muy caro, por favor, pngase en contacto con landry rieger llamando al [phone] y presione la opcin 4 o envenos un mensaje a travs de Clinical cytogeneticist.   No podemos decirle cul ser su copago por los medicamentos por adelantado ya que esto es diferente dependiendo de la cobertura de su seguro. Sin embargo, es posible que podamos encontrar un medicamento sustituto a Audiological scientist un formulario para que el seguro cubra el medicamento que se considera necesario.   Si se requiere una autorizacin previa para que su compaa de seguros malta su medicamento, por favor permtanos de 1 a 2 das hbiles para completar este proceso.  Los precios de los medicamentos varan con frecuencia dependiendo del Environmental consultant de dnde se surte la receta y alguna farmacias pueden ofrecer precios ms baratos.  El sitio web www.goodrx.com tiene cupones para medicamentos de Health and safety inspector. Los precios aqu no tienen en cuenta lo que podra costar con la ayuda del seguro (puede ser ms barato con su seguro), pero el sitio web puede darle el precio si no utiliz Tourist information centre manager.  - Puede imprimir el cupn correspondiente y llevarlo con su receta a la farmacia.  - Tambin puede pasar por nuestra oficina durante el horario de atencin regular y Education officer, museum una tarjeta de cupones de GoodRx.  - Si necesita que su receta se enve electrnicamente a una farmacia diferente, informe a nuestra oficina a travs de MyChart de [place] o por telfono llamando al [phone] y presione la opcin 4.

## 2024-03-24 ENCOUNTER — Other Ambulatory Visit: Payer: Self-pay | Admitting: Family Medicine

## 2024-03-25 NOTE — Telephone Encounter (Signed)
 Last office 12/26/2023 for Prediabetes/Hyperlipidemia.  Last refilled 08/14/2022 for #[id number] with no refills.  Next Appt: CPE 07/02/2024

## 2024-04-06 ENCOUNTER — Inpatient Hospital Stay: Attending: Family Medicine

## 2024-04-06 NOTE — Progress Notes (Signed)
 [facility] Healthcare Advance Directives Clinical Social Work  Patient presented to Advance Directives Clinic  to review and complete healthcare advance directives.  Clinical Social Worker met with Doyal and Kristine [name].  She designated Kristine [name] as their primary healthcare agent and Danielle Reznicsek as their secondary agent.  She also completed healthcare living will.    Documents were notarized and copies made for her. Clinical Social Worker will send documents to medical records to be scanned into her chart. Clinical Social Worker encouraged her to contact with any additional questions or concerns.   Macario CHRISTELLA Au, LCSW Clinical Social Worker [facility]

## 2024-06-10 ENCOUNTER — Telehealth: Payer: Self-pay | Admitting: *Deleted

## 2024-06-10 DIAGNOSIS — E7800 Pure hypercholesterolemia, unspecified: Secondary | ICD-10-CM

## 2024-06-10 DIAGNOSIS — R7303 Prediabetes: Secondary | ICD-10-CM

## 2024-06-10 DIAGNOSIS — D509 Iron deficiency anemia, unspecified: Secondary | ICD-10-CM

## 2024-06-10 DIAGNOSIS — E039 Hypothyroidism, unspecified: Secondary | ICD-10-CM

## 2024-06-10 NOTE — Telephone Encounter (Signed)
-----   Message from [name] sent at 06/09/2024  3:36 PM EST ----- Regarding: Lab orders for Tue, 1.27.26 Patient is scheduled for CPX labs, please order future labs, Thanks , Veva

## 2024-06-13 ENCOUNTER — Other Ambulatory Visit: Payer: Self-pay | Admitting: Family Medicine

## 2024-06-22 ENCOUNTER — Other Ambulatory Visit: Payer: Self-pay | Admitting: Family Medicine

## 2024-06-23 ENCOUNTER — Other Ambulatory Visit

## 2024-06-25 ENCOUNTER — Other Ambulatory Visit

## 2024-06-25 ENCOUNTER — Ambulatory Visit: Payer: Self-pay | Admitting: Family Medicine

## 2024-06-25 DIAGNOSIS — R7303 Prediabetes: Secondary | ICD-10-CM

## 2024-06-25 DIAGNOSIS — E7800 Pure hypercholesterolemia, unspecified: Secondary | ICD-10-CM

## 2024-06-25 DIAGNOSIS — D509 Iron deficiency anemia, unspecified: Secondary | ICD-10-CM

## 2024-06-25 DIAGNOSIS — E039 Hypothyroidism, unspecified: Secondary | ICD-10-CM | POA: Diagnosis not present

## 2024-06-25 LAB — CBC WITH DIFFERENTIAL/PLATELET
Basophils Absolute: 0 10*3/uL (ref 0.0–0.1)
Basophils Relative: 0.6 % (ref 0.0–3.0)
Eosinophils Absolute: 0.2 10*3/uL (ref 0.0–0.7)
Eosinophils Relative: 3.5 % (ref 0.0–5.0)
HCT: 39.5 % (ref 36.0–46.0)
Hemoglobin: 13.2 g/dL (ref 12.0–15.0)
Lymphocytes Relative: 32.5 % (ref 12.0–46.0)
Lymphs Abs: 2.1 10*3/uL (ref 0.7–4.0)
MCHC: 33.4 g/dL (ref 30.0–36.0)
MCV: 87.1 fl (ref 78.0–100.0)
Monocytes Absolute: 0.5 10*3/uL (ref 0.1–1.0)
Monocytes Relative: 7.2 % (ref 3.0–12.0)
Neutro Abs: 3.6 10*3/uL (ref 1.4–7.7)
Neutrophils Relative %: 56.2 % (ref 43.0–77.0)
Platelets: 215 10*3/uL (ref 150.0–400.0)
RBC: 4.54 Mil/uL (ref 3.87–5.11)
RDW: 14.4 % (ref 11.5–15.5)
WBC: 6.4 10*3/uL (ref 4.0–10.5)

## 2024-06-25 LAB — COMPREHENSIVE METABOLIC PANEL WITH GFR
ALT: 10 U/L (ref 3–35)
AST: 11 U/L (ref 5–37)
Albumin: 4.1 g/dL (ref 3.5–5.2)
Alkaline Phosphatase: 49 U/L (ref 39–117)
BUN: 17 mg/dL (ref 6–23)
CO2: 32 meq/L (ref 19–32)
Calcium: 9.3 mg/dL (ref 8.4–10.5)
Chloride: 104 meq/L (ref 96–112)
Creatinine, Ser: 0.73 mg/dL (ref 0.40–1.20)
GFR: 75.5 mL/min
Glucose, Bld: 128 mg/dL — ABNORMAL HIGH (ref 70–99)
Potassium: 4.2 meq/L (ref 3.5–5.1)
Sodium: 141 meq/L (ref 135–145)
Total Bilirubin: 0.7 mg/dL (ref 0.2–1.2)
Total Protein: 6.1 g/dL (ref 6.0–8.3)

## 2024-06-25 LAB — TSH: TSH: 4.06 u[IU]/mL (ref 0.35–5.50)

## 2024-06-25 LAB — LIPID PANEL
Cholesterol: 158 mg/dL (ref 28–200)
HDL: 56.7 mg/dL
LDL Cholesterol: 63 mg/dL (ref 10–99)
NonHDL: 101.19
Total CHOL/HDL Ratio: 3
Triglycerides: 190 mg/dL — ABNORMAL HIGH (ref 10.0–149.0)
VLDL: 38 mg/dL (ref 0.0–40.0)

## 2024-06-25 LAB — T4, FREE: Free T4: 0.95 ng/dL (ref 0.60–1.60)

## 2024-06-25 LAB — T3, FREE: T3, Free: 2.7 pg/mL (ref 2.3–4.2)

## 2024-06-25 LAB — HEMOGLOBIN A1C: Hgb A1c MFr Bld: 6.3 % (ref 4.6–6.5)

## 2024-06-25 NOTE — Progress Notes (Signed)
 No critical labs need to be addressed urgently. We will discuss labs in detail at upcoming office visit.

## 2024-06-30 ENCOUNTER — Other Ambulatory Visit: Payer: Self-pay | Admitting: Family Medicine

## 2024-06-30 DIAGNOSIS — Z1231 Encounter for screening mammogram for malignant neoplasm of breast: Secondary | ICD-10-CM

## 2024-07-02 ENCOUNTER — Ambulatory Visit
Admission: RE | Admit: 2024-07-02 | Discharge: 2024-07-02 | Disposition: A | Source: Ambulatory Visit | Attending: Family Medicine | Admitting: Family Medicine

## 2024-07-02 ENCOUNTER — Encounter: Payer: Self-pay | Admitting: Family Medicine

## 2024-07-02 ENCOUNTER — Ambulatory Visit: Payer: Self-pay | Admitting: Family Medicine

## 2024-07-02 ENCOUNTER — Ambulatory Visit: Admitting: Family Medicine

## 2024-07-02 VITALS — BP 128/80 | HR 65 | Temp 97.7°F | Ht 65.0 in | Wt 235.2 lb

## 2024-07-02 DIAGNOSIS — M542 Cervicalgia: Secondary | ICD-10-CM | POA: Insufficient documentation

## 2024-07-02 DIAGNOSIS — E7800 Pure hypercholesterolemia, unspecified: Secondary | ICD-10-CM

## 2024-07-02 DIAGNOSIS — Z6836 Body mass index (BMI) 36.0-36.9, adult: Secondary | ICD-10-CM

## 2024-07-02 DIAGNOSIS — I10 Essential (primary) hypertension: Secondary | ICD-10-CM

## 2024-07-02 DIAGNOSIS — Z0000 Encounter for general adult medical examination without abnormal findings: Secondary | ICD-10-CM

## 2024-07-02 DIAGNOSIS — E039 Hypothyroidism, unspecified: Secondary | ICD-10-CM

## 2024-07-02 DIAGNOSIS — R7303 Prediabetes: Secondary | ICD-10-CM

## 2024-07-02 DIAGNOSIS — R2241 Localized swelling, mass and lump, right lower limb: Secondary | ICD-10-CM

## 2024-07-02 NOTE — Assessment & Plan Note (Signed)
 Chronic, discussed low carbohydrate diet, activity as tolerated and continued weight loss.

## 2024-07-02 NOTE — Assessment & Plan Note (Signed)
 Encouraged exercise, weight loss, healthy eating habits. ? ?

## 2024-07-02 NOTE — Assessment & Plan Note (Signed)
"   Eval ate with plain X-ray.  Limit motion of neck.. could cervical collar.  Continue muscle relaxation ibuprofen.  Can consdier steroid course if not improving vs referral for steroid injeciton.  "

## 2024-07-02 NOTE — Assessment & Plan Note (Signed)
Chronic,  improved control  tolerating well. LDL  almost  at goal less than 70  better  on Crestor 10 mg daily.  Encouraged exercise, weight loss, healthy eating habits.

## 2024-07-02 NOTE — Assessment & Plan Note (Signed)
Chronic, well controlled.    HCTZ 25 mg daily and valsartan160 mg daily

## 2024-07-02 NOTE — Progress Notes (Signed)
 "   Patient ID: Kristine [name], female    DOB: February 27, 1941, 84 y.o.   MRN: [id number]  This visit was conducted in person.  BP 128/80 (BP Location: Right Arm, Patient Position: Sitting, Cuff Size: Large)   Pulse 65   Temp 97.7 F (36.5 C) (Oral)   Ht 5' 5 (1.651 m)   Wt 235 lb 3.2 oz (106.7 kg)   SpO2 97%   BMI 39.14 kg/m    CC:  Chief Complaint  Patient presents with   Annual Exam    Pt concerned about lump in lower right leg, feels it is growing and is concerned Pt also reports overall leg strength decrease   Would like to discuss management of neck spasm    Subjective:   HPI: Kristine [name] is a 84 y.o. female presenting on 07/02/2024 for Annual Exam (Pt concerned about lump in lower right leg, feels it is growing and is concerned/Pt also reports overall leg strength decrease //Would like to discuss management of neck spasm) The patient presents for  complete physical and review of chronic health problems. He/She also has the following acute concerns today:   Noted lump in right lower leg in 02/2024, increasing in size, firm, nontender.  No skin change.  Has varicose veins.  Saw Derm... given triamcinolone ..  Has more chronic swelling in right leg.  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  After spinal fusion.. her back pain is improved, She is still using gabapentin  600 mg 3 times daily.  Only using hydrocodone  off and on.  Not using tramadol .   Neck/upper back  spasms off and on 10 months ago.. pain in right upper back radaites to posterior head. Has started some neck stretching.  Using heat.. can trigger pain with turning head.  No radiation to right arm. No new numbness or weakness.  No numbness.  Worsened in last 2 months.  Ibuprofen and muscle relaxer has helped some  Hypertension:   Improved control on  HCTZ 25 mg daily and valsartan160 mg daily BP Readings from Last 3 Encounters:  07/02/24 128/80  12/26/23  120/60  06/28/23 110/60  Using medication without problems or lightheadedness: none Chest pain with exertion: none Edema:none Short of breath: occ spells of SOB in heat  Average home BPs: 130/60 Other issues:  Prediabetes  Lab Results  Component Value Date   HGBA1C 6.3 06/25/2024    Elevated Cholesterol:  Almost at goal on   crestor  10mg  daily Lab Results  Component Value Date   CHOL 158 06/25/2024   HDL 56.70 06/25/2024   LDLCALC 63 06/25/2024   LDLDIRECT 105.0 06/14/2020   TRIG 190.0 (H) 06/25/2024   CHOLHDL 3 06/25/2024  Using medications without problems: Muscle aches: none Diet compliance:none Exercise: walking off and on Other complaints:  OAB,  previously controlled on vesicare  5 mg daily.   Hypothyroidism  Stable control on levo 75 mcg daily.  Normal free T3 and free T4 Lab Results  Component Value Date   TSH 4.06 06/25/2024     Obesity:  Wt Readings from Last 3 Encounters:  07/02/24 235 lb 3.2 oz (106.7 kg)  12/26/23 233 lb 6 oz (105.9 kg)  06/28/23 233 lb 8 oz (105.9 kg)    Lab Results  Component Value Date   HGBA1C 6.3 06/25/2024       Relevant past medical, surgical, family and social history reviewed and updated as indicated. Interim medical history since our  last visit reviewed. Allergies and medications reviewed and updated. Outpatient Medications Prior to Visit  Medication Sig Dispense Refill   Azelastine HCl 137 MCG/SPRAY SOLN Place into both nostrils.     cyanocobalamin  2000 MCG tablet Take 2,000 mcg by mouth daily.     fexofenadine (ALLEGRA) 180 MG tablet Take 180 mg by mouth as needed for allergies or rhinitis.     fluticasone  (FLONASE ) 50 MCG/ACT nasal spray Place 2 sprays into both nostrils daily as needed for allergies.     folic acid  (FOLVITE ) 1 MG tablet Take 2 mg by mouth daily.     gabapentin  (NEURONTIN ) 600 MG tablet TAKE 1 TABLET 3 TIMES A    DAY. 270 tablet 1   hydrochlorothiazide  (HYDRODIURIL ) 25 MG tablet TAKE 1 TABLET (25  MG TOTAL) BY MOUTH DAILY. 90 tablet 3   levothyroxine  (SYNTHROID ) 75 MCG tablet TAKE 1 TABLET DAILY BEFORE BREAKFAST 90 tablet 0   methocarbamol  (ROBAXIN ) 500 MG tablet TAKE 1 TABLET BY MOUTH EVERY 6 HOURS AS NEEDED FOR MUSCLE SPASMS. 60 tablet 0   methotrexate (RHEUMATREX) 2.5 MG tablet Take 15 mg by mouth every Wednesday.     montelukast  (SINGULAIR ) 10 MG tablet Take 1 tablet (10 mg total) by mouth at bedtime. 90 tablet 3   Omega-3 Fatty Acids (FISH OIL) 1200 MG CAPS Take 2,400 mg by mouth in the morning, at noon, and at bedtime.     omeprazole (PRILOSEC) 20 MG capsule Take 20 mg by mouth daily.     polyethylene glycol (MIRALAX  / GLYCOLAX ) packet Take 17 g by mouth daily as needed.     PREMARIN  0.625 MG tablet TAKE 1 TABLET EVERY EVENING 90 tablet 3   rosuvastatin  (CRESTOR ) 10 MG tablet TAKE 1 TABLET BY MOUTH EVERY DAY 90 tablet 3   solifenacin  (VESICARE ) 10 MG tablet TAKE 1 TABLET DAILY 90 tablet 3   valsartan  (DIOVAN ) 160 MG tablet TAKE 1 TABLET BY MOUTH EVERY DAY 90 tablet 3   ALPRAZolam  (XANAX ) 0.25 MG tablet Take 1-2 tablets (0.25-0.5 mg total) by mouth daily as needed (prior to imaging). (Patient not taking: Reported on 07/02/2024) 2 tablet 0   amoxicillin  (AMOXIL ) 500 MG capsule Take 500 mg by mouth See admin instructions. Take 2000 mg by mouth 1 hour prior to dental procedures (Patient not taking: Reported on 07/02/2024)     colchicine  0.6 MG tablet Take 1 tablet (0.6 mg total) by mouth 2 (two) times daily as needed (for gout flare). (Patient not taking: Reported on 07/02/2024) 90 tablet 1   Neomy-Bacit-Polymyx-Pramoxine (TRIPLE ANTIBIOTIC PAIN RELIEF) 1 % OINT Apply 1 application  topically as needed (wound care). (Patient not taking: Reported on 07/02/2024)     Probiotic Product (PROBIOTIC PO) Take 1 tablet by mouth daily as needed. (Patient not taking: Reported on 07/02/2024)     triamcinolone  cream (KENALOG ) 0.1 % Apply once or twice daily to affected area on leg as needed for itching/burning  (Patient not taking: Reported on 07/02/2024) 45 g 2   No facility-administered medications prior to visit.     Per HPI unless specifically indicated in ROS section below Review of Systems  Constitutional:  Negative for fatigue and fever.  HENT:  Negative for congestion.   Eyes:  Negative for pain.  Respiratory:  Negative for cough and shortness of breath.   Cardiovascular:  Negative for chest pain, palpitations and leg swelling.  Gastrointestinal:  Negative for abdominal pain.  Genitourinary:  Negative for dysuria and vaginal bleeding.  Musculoskeletal:  Negative for back pain.  Neurological:  Negative for syncope, light-headedness and headaches.  Psychiatric/Behavioral:  Negative for dysphoric mood.    Objective:  BP 128/80 (BP Location: Right Arm, Patient Position: Sitting, Cuff Size: Large)   Pulse 65   Temp 97.7 F (36.5 C) (Oral)   Ht 5' 5 (1.651 m)   Wt 235 lb 3.2 oz (106.7 kg)   SpO2 97%   BMI 39.14 kg/m   Wt Readings from Last 3 Encounters:  07/02/24 235 lb 3.2 oz (106.7 kg)  12/26/23 233 lb 6 oz (105.9 kg)  06/28/23 233 lb 8 oz (105.9 kg)      Physical Exam Vitals and nursing note reviewed.  Constitutional:      General: She is not in acute distress.    Appearance: Normal appearance. She is well-developed. She is not ill-appearing or toxic-appearing.  HENT:     Head: Normocephalic.     Right Ear: Hearing, tympanic membrane, ear canal and external ear normal. Tympanic membrane is not erythematous, retracted or bulging.     Left Ear: Hearing, tympanic membrane, ear canal and external ear normal. Tympanic membrane is not erythematous, retracted or bulging.     Nose: Nose normal. No mucosal edema or rhinorrhea.     Right Sinus: No maxillary sinus tenderness or frontal sinus tenderness.     Left Sinus: No maxillary sinus tenderness or frontal sinus tenderness.     Mouth/Throat:     Pharynx: Uvula midline.  Eyes:     General: Lids are normal. Lids are everted, no  foreign bodies appreciated.     Conjunctiva/sclera: Conjunctivae normal.     Pupils: Pupils are equal, round, and reactive to light.  Neck:     Thyroid : No thyroid  mass or thyromegaly.     Vascular: No carotid bruit.     Trachea: Trachea normal.   Cardiovascular:     Rate and Rhythm: Normal rate and regular rhythm.     Pulses: Normal pulses.     Heart sounds: Normal heart sounds, S1 normal and S2 normal. No murmur heard.    No friction rub. No gallop.     Comments: Bilateral varicose veins   Irregular very firm mass, nontender in left medial calf.. not associated blood vessel. Pulmonary:     Effort: Pulmonary effort is normal. No tachypnea or respiratory distress.     Breath sounds: Normal breath sounds. No decreased breath sounds, wheezing, rhonchi or rales.  Abdominal:     General: Bowel sounds are normal. There is no distension or abdominal bruit.     Palpations: Abdomen is soft. There is no fluid wave or mass.     Tenderness: There is no abdominal tenderness. There is no guarding or rebound.     Hernia: No hernia is present.  Musculoskeletal:     Cervical back: Neck supple. Crepitus present. Pain with movement, spinous process tenderness and muscular tenderness present. Decreased range of motion.     Right lower leg: Edema present.     Left lower leg: Edema (left medila le) present.  Lymphadenopathy:     Cervical: No cervical adenopathy.  Skin:    General: Skin is warm and dry.     Findings: No rash.  Neurological:     Mental Status: She is alert.     Cranial Nerves: No cranial nerve deficit.     Sensory: No sensory deficit.  Psychiatric:        Mood and Affect: Mood is not anxious or depressed.  Speech: Speech normal.        Behavior: Behavior normal. Behavior is cooperative.        Thought Content: Thought content normal.        Judgment: Judgment normal.       Results for orders placed or performed in visit on 06/25/24  CBC with Differential/Platelet    Collection Time: 06/25/24  9:07 AM  Result Value Ref Range   WBC 6.4 4.0 - 10.5 K/uL   RBC 4.54 3.87 - 5.11 Mil/uL   Hemoglobin 13.2 12.0 - 15.0 g/dL   HCT 60.4 63.9 - 53.9 %   MCV 87.1 78.0 - 100.0 fl   MCHC 33.4 30.0 - 36.0 g/dL   RDW 85.5 88.4 - 84.4 %   Platelets 215.0 150.0 - 400.0 K/uL   Neutrophils Relative % 56.2 43.0 - 77.0 %   Lymphocytes Relative 32.5 12.0 - 46.0 %   Monocytes Relative 7.2 3.0 - 12.0 %   Eosinophils Relative 3.5 0.0 - 5.0 %   Basophils Relative 0.6 0.0 - 3.0 %   Neutro Abs 3.6 1.4 - 7.7 K/uL   Lymphs Abs 2.1 0.7 - 4.0 K/uL   Monocytes Absolute 0.5 0.1 - 1.0 K/uL   Eosinophils Absolute 0.2 0.0 - 0.7 K/uL   Basophils Absolute 0.0 0.0 - 0.1 K/uL  T3, free   Collection Time: 06/25/24  9:07 AM  Result Value Ref Range   T3, Free 2.7 2.3 - 4.2 pg/mL  TSH   Collection Time: 06/25/24  9:07 AM  Result Value Ref Range   TSH 4.06 0.35 - 5.50 uIU/mL  T4, free   Collection Time: 06/25/24  9:07 AM  Result Value Ref Range   Free T4 0.95 0.60 - 1.60 ng/dL  Hemoglobin J8r   Collection Time: 06/25/24  9:07 AM  Result Value Ref Range   Hgb A1c MFr Bld 6.3 4.6 - 6.5 %  Lipid panel   Collection Time: 06/25/24  9:07 AM  Result Value Ref Range   Cholesterol 158 28 - 200 mg/dL   Triglycerides 809.9 (H) 10.0 - 149.0 mg/dL   HDL 43.29 >60.99 mg/dL   VLDL 61.9 0.0 - 59.9 mg/dL   LDL Cholesterol 63 10 - 99 mg/dL   Total CHOL/HDL Ratio 3    NonHDL 101.19   Comprehensive metabolic panel   Collection Time: 06/25/24  9:07 AM  Result Value Ref Range   Sodium 141 135 - 145 mEq/L   Potassium 4.2 3.5 - 5.1 mEq/L   Chloride 104 96 - 112 mEq/L   CO2 32 19 - 32 mEq/L   Glucose, Bld 128 (H) 70 - 99 mg/dL   BUN 17 6 - 23 mg/dL   Creatinine, Ser 9.26 0.40 - 1.20 mg/dL   Total Bilirubin 0.7 0.2 - 1.2 mg/dL   Alkaline Phosphatase 49 39 - 117 U/L   AST 11 5 - 37 U/L   ALT 10 3 - 35 U/L   Total Protein 6.1 6.0 - 8.3 g/dL   Albumin  4.1 3.5 - 5.2 g/dL   GFR 24.49 >39.99  mL/min   Calcium  9.3 8.4 - 10.5 mg/dL    This visit occurred during the SARS-CoV-2 public health emergency.  Safety protocols were in place, including screening questions prior to the visit, additional usage of staff PPE, and extensive cleaning of exam room while observing appropriate contact time as indicated for disinfecting solutions.   COVID 19 screen:  No recent travel or known exposure to COVID19 The patient denies  respiratory symptoms of COVID 19 at this time. The importance of social distancing was discussed today.   Assessment and Plan   The patient's preventative maintenance and recommended screening tests for an annual wellness exam were reviewed in full today. Brought up to date unless services declined.  Counselled on the importance of diet, exercise, and its role in overall health and mortality. The patient's FH and SH was reviewed, including their home life, tobacco status, and drug and alcohol status.   Vaccines: uptodate  PNA, zoster. SE to flu in past refused Pap/DVE: Pap/DVE not indicated.   asymptomatic Mammo:07/2023, plans yearly Bone Density:06/16/21 wrist nml.. Plan repeat in 5 years Colon: 04/2018 Dr. Shila high grade tubular adenoma.. repeat  09/2021 no further indicated Smoking Status: none   Problem List Items Addressed This Visit     Acute neck pain   Relevant Orders   DG Cervical Spine Complete   Class 2 severe obesity due to excess calories with serious comorbidity and body mass index (BMI) of 36.0 to 36.9 in adult   Encouraged exercise, weight loss, healthy eating habits.       Essential hypertension, benign (Chronic)   Chronic, well controlled.    HCTZ 25 mg daily and valsartan160 mg daily      Hypothyroidism (Chronic)   Stable, chronic.  Continue current medication.   Stable control on levo 75 mcg daily.      Prediabetes (Chronic)   Chronic, discussed low carbohydrate diet, activity as tolerated and continued weight loss.      Pure  hypercholesterolemia (Chronic)   Chronic,  improved control  tolerating well. LDL  almost  at goal less than 70  better  on Crestor  10 mg daily.  Encouraged exercise, weight loss, healthy eating habits.       Other Visit Diagnoses       Routine general medical examination at a health care facility    -  Primary     Leg mass, right       Relevant Orders   US  LIMITED JOINT SPACE STRUCTURES UP LEFT          Greig Ring, MD   "

## 2024-07-02 NOTE — Assessment & Plan Note (Signed)
Stable, chronic.  Continue current medication.   Stable control on levo 75 mcg daily. 

## 2024-07-10 ENCOUNTER — Ambulatory Visit

## 2024-07-21 ENCOUNTER — Other Ambulatory Visit

## 2024-08-07 ENCOUNTER — Ambulatory Visit

## 2025-03-09 ENCOUNTER — Encounter: Admitting: Dermatology
